# Patient Record
Sex: Male | Born: 1945 | Race: White | Hispanic: No | Smoking: Former smoker
Health system: Southern US, Community
[De-identification: ages and names within clinical notes are randomized; demographics above are authoritative.]

## PROBLEM LIST (undated history)

## (undated) DIAGNOSIS — M1990 Unspecified osteoarthritis, unspecified site: Secondary | ICD-10-CM

## (undated) DIAGNOSIS — J45909 Unspecified asthma, uncomplicated: Secondary | ICD-10-CM

## (undated) DIAGNOSIS — E7800 Pure hypercholesterolemia, unspecified: Secondary | ICD-10-CM

## (undated) DIAGNOSIS — I2510 Atherosclerotic heart disease of native coronary artery without angina pectoris: Secondary | ICD-10-CM

## (undated) DIAGNOSIS — F419 Anxiety disorder, unspecified: Secondary | ICD-10-CM

## (undated) DIAGNOSIS — I639 Cerebral infarction, unspecified: Secondary | ICD-10-CM

## (undated) DIAGNOSIS — F329 Major depressive disorder, single episode, unspecified: Secondary | ICD-10-CM

## (undated) DIAGNOSIS — E119 Type 2 diabetes mellitus without complications: Secondary | ICD-10-CM

## (undated) DIAGNOSIS — J1100 Influenza due to unidentified influenza virus with unspecified type of pneumonia: Secondary | ICD-10-CM

## (undated) DIAGNOSIS — A048 Other specified bacterial intestinal infections: Secondary | ICD-10-CM

## (undated) DIAGNOSIS — J189 Pneumonia, unspecified organism: Secondary | ICD-10-CM

## (undated) DIAGNOSIS — I10 Essential (primary) hypertension: Secondary | ICD-10-CM

## (undated) DIAGNOSIS — J449 Chronic obstructive pulmonary disease, unspecified: Secondary | ICD-10-CM

## (undated) DIAGNOSIS — G3184 Mild cognitive impairment, so stated: Secondary | ICD-10-CM

## (undated) DIAGNOSIS — F0390 Unspecified dementia without behavioral disturbance: Secondary | ICD-10-CM

## (undated) DIAGNOSIS — I714 Abdominal aortic aneurysm, without rupture, unspecified: Secondary | ICD-10-CM

## (undated) DIAGNOSIS — F32A Depression, unspecified: Secondary | ICD-10-CM

## (undated) DIAGNOSIS — K313 Pylorospasm, not elsewhere classified: Secondary | ICD-10-CM

## (undated) DIAGNOSIS — G250 Essential tremor: Secondary | ICD-10-CM

## (undated) DIAGNOSIS — D649 Anemia, unspecified: Secondary | ICD-10-CM

## (undated) DIAGNOSIS — I209 Angina pectoris, unspecified: Secondary | ICD-10-CM

## (undated) DIAGNOSIS — G43909 Migraine, unspecified, not intractable, without status migrainosus: Secondary | ICD-10-CM

## (undated) DIAGNOSIS — I219 Acute myocardial infarction, unspecified: Secondary | ICD-10-CM

## (undated) DIAGNOSIS — R011 Cardiac murmur, unspecified: Secondary | ICD-10-CM

## (undated) DIAGNOSIS — E559 Vitamin D deficiency, unspecified: Secondary | ICD-10-CM

## (undated) HISTORY — DX: Unspecified dementia, unspecified severity, without behavioral disturbance, psychotic disturbance, mood disturbance, and anxiety: F03.90

## (undated) HISTORY — DX: Acute myocardial infarction, unspecified: I21.9

## (undated) HISTORY — DX: Chronic obstructive pulmonary disease, unspecified: J44.9

## (undated) HISTORY — DX: Atherosclerotic heart disease of native coronary artery without angina pectoris: I25.10

## (undated) HISTORY — DX: Pure hypercholesterolemia, unspecified: E78.00

## (undated) HISTORY — DX: Depression, unspecified: F32.A

## (undated) HISTORY — DX: Anemia, unspecified: D64.9

## (undated) HISTORY — DX: Influenza due to unidentified influenza virus with unspecified type of pneumonia: J11.00

## (undated) HISTORY — DX: Unspecified asthma, uncomplicated: J45.909

## (undated) HISTORY — DX: Major depressive disorder, single episode, unspecified: F32.9

## (undated) HISTORY — DX: Other specified bacterial intestinal infections: A04.8

## (undated) HISTORY — DX: Essential tremor: G25.0

## (undated) HISTORY — DX: Anxiety disorder, unspecified: F41.9

## (undated) HISTORY — DX: Vitamin D deficiency, unspecified: E55.9

## (undated) HISTORY — DX: Migraine, unspecified, not intractable, without status migrainosus: G43.909

## (undated) HISTORY — DX: Mild cognitive impairment, so stated: G31.84

---
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
## 1997-04-26 ENCOUNTER — Inpatient Hospital Stay (HOSPITAL_COMMUNITY): Admission: EM | Admit: 1997-04-26 | Discharge: 1997-04-30 | Payer: Self-pay | Admitting: *Deleted

## 1997-05-14 ENCOUNTER — Encounter (HOSPITAL_COMMUNITY): Admission: RE | Admit: 1997-05-14 | Discharge: 1997-08-12 | Payer: Self-pay | Admitting: Cardiovascular Disease

## 1999-06-05 ENCOUNTER — Ambulatory Visit (HOSPITAL_COMMUNITY): Admission: RE | Admit: 1999-06-05 | Discharge: 1999-06-05 | Payer: Self-pay | Admitting: Cardiovascular Disease

## 2000-02-06 ENCOUNTER — Ambulatory Visit (HOSPITAL_BASED_OUTPATIENT_CLINIC_OR_DEPARTMENT_OTHER): Admission: RE | Admit: 2000-02-06 | Discharge: 2000-02-07 | Payer: Self-pay | Admitting: Emergency Medicine

## 2000-07-29 ENCOUNTER — Encounter: Payer: Self-pay | Admitting: Emergency Medicine

## 2000-07-29 ENCOUNTER — Encounter: Admission: RE | Admit: 2000-07-29 | Discharge: 2000-07-29 | Payer: Self-pay | Admitting: Emergency Medicine

## 2001-04-28 ENCOUNTER — Ambulatory Visit (HOSPITAL_COMMUNITY): Admission: RE | Admit: 2001-04-28 | Discharge: 2001-04-28 | Payer: Self-pay | Admitting: *Deleted

## 2002-06-06 ENCOUNTER — Encounter: Payer: Self-pay | Admitting: Emergency Medicine

## 2002-06-06 ENCOUNTER — Encounter: Admission: RE | Admit: 2002-06-06 | Discharge: 2002-06-06 | Payer: Self-pay | Admitting: Emergency Medicine

## 2002-06-17 ENCOUNTER — Encounter: Payer: Self-pay | Admitting: Emergency Medicine

## 2002-06-17 ENCOUNTER — Encounter: Admission: RE | Admit: 2002-06-17 | Discharge: 2002-06-17 | Payer: Self-pay | Admitting: Emergency Medicine

## 2002-07-02 ENCOUNTER — Encounter: Admission: RE | Admit: 2002-07-02 | Discharge: 2002-09-30 | Payer: Self-pay | Admitting: Emergency Medicine

## 2003-12-10 ENCOUNTER — Encounter: Admission: RE | Admit: 2003-12-10 | Discharge: 2003-12-10 | Payer: Self-pay | Admitting: Emergency Medicine

## 2005-11-22 ENCOUNTER — Encounter: Admission: RE | Admit: 2005-11-22 | Discharge: 2005-11-22 | Payer: Self-pay | Admitting: Emergency Medicine

## 2005-12-03 ENCOUNTER — Encounter: Admission: RE | Admit: 2005-12-03 | Discharge: 2005-12-03 | Payer: Self-pay | Admitting: Emergency Medicine

## 2005-12-23 ENCOUNTER — Encounter: Admission: RE | Admit: 2005-12-23 | Discharge: 2005-12-23 | Payer: Self-pay | Admitting: Emergency Medicine

## 2006-01-10 ENCOUNTER — Encounter: Admission: RE | Admit: 2006-01-10 | Discharge: 2006-01-10 | Payer: Self-pay | Admitting: Emergency Medicine

## 2006-04-26 ENCOUNTER — Encounter: Admission: RE | Admit: 2006-04-26 | Discharge: 2006-04-26 | Payer: Self-pay | Admitting: Emergency Medicine

## 2006-05-13 ENCOUNTER — Ambulatory Visit: Payer: Self-pay | Admitting: Vascular Surgery

## 2006-08-05 ENCOUNTER — Ambulatory Visit: Payer: Self-pay | Admitting: Vascular Surgery

## 2007-02-08 ENCOUNTER — Ambulatory Visit: Payer: Self-pay | Admitting: Vascular Surgery

## 2007-10-18 ENCOUNTER — Ambulatory Visit: Payer: Self-pay | Admitting: Vascular Surgery

## 2008-10-23 ENCOUNTER — Ambulatory Visit: Payer: Self-pay | Admitting: Vascular Surgery

## 2009-04-04 ENCOUNTER — Emergency Department (HOSPITAL_COMMUNITY): Admission: EM | Admit: 2009-04-04 | Discharge: 2009-04-04 | Payer: Self-pay | Admitting: Emergency Medicine

## 2009-12-04 ENCOUNTER — Ambulatory Visit: Payer: Self-pay | Admitting: Vascular Surgery

## 2010-02-14 ENCOUNTER — Encounter: Payer: Self-pay | Admitting: Emergency Medicine

## 2010-02-21 ENCOUNTER — Emergency Department (HOSPITAL_COMMUNITY)
Admission: EM | Admit: 2010-02-21 | Discharge: 2010-02-22 | Payer: Self-pay | Source: Home / Self Care | Admitting: Emergency Medicine

## 2010-04-20 LAB — POCT I-STAT, CHEM 8
Hemoglobin: 13.9 g/dL (ref 13.0–17.0)
Potassium: 3.9 mEq/L (ref 3.5–5.1)
Sodium: 141 mEq/L (ref 135–145)
TCO2: 30 mmol/L (ref 0–100)

## 2010-04-20 LAB — DIFFERENTIAL
Basophils Absolute: 0 10*3/uL (ref 0.0–0.1)
Eosinophils Absolute: 0.2 10*3/uL (ref 0.0–0.7)
Eosinophils Relative: 3 % (ref 0–5)
Lymphocytes Relative: 21 % (ref 12–46)
Neutrophils Relative %: 68 % (ref 43–77)

## 2010-04-20 LAB — CBC
HCT: 39.7 % (ref 39.0–52.0)
MCV: 100.3 fL — ABNORMAL HIGH (ref 78.0–100.0)
Platelets: 213 10*3/uL (ref 150–400)
RDW: 14.2 % (ref 11.5–15.5)

## 2010-06-09 NOTE — Assessment & Plan Note (Signed)
OFFICE VISIT   [name], [name]  DOB:  1945/05/05                                       10/18/2007  CHART#:[id number]   The patient returns for followup today for a small abdominal aortic  aneurysm.  He was last seen in July of 2008.  He denies any abdominal or  back pain.  His atherosclerotic risk factors continue to include  hypertension, coronary artery disease and elevated cholesterol.   He has had no significant medical problems since his last office visit.   PHYSICAL EXAMINATION:  On physical exam today blood pressure is 126/82  in the left arm, pulse is 50 and regular.  HEENT is unremarkable.  Neck  has 2+ carotid pulses without bruit.  Chest is clear to auscultation.  Cardiac exam is regular rate and rhythm without murmur.  Abdomen is  soft, nontender, nondistended with a vaguely palpable pulsatile mass in  the epigastrium.  He has 2+ femoral pulses bilaterally.   Ultrasound of the aorta was performed again today which shows the  aneurysm diameter is 3.8 cm in diameter.  I reassured the patient that  this is still quite small and I would not consider repair until it is at  least 5.5 cm larger or symptomatic.  He will follow up in 1 year's time  with repeat aortic ultrasound.   Janetta Hora. Fields, MD  Electronically Signed   CEF/MEDQ  D:  10/18/2007  T:  10/19/2007  Job:  [id number]   cc:   Reuben Likes, M.D.

## 2010-06-09 NOTE — Procedures (Signed)
DUPLEX ULTRASOUND OF ABDOMINAL AORTA   INDICATION:  Followup of known abdominal aortic aneurysm.  The patient was recently diagnosed with Alzheimer's disease.   HISTORY:  Diabetes:  No.  Cardiac:  Ischemic heart disease, murmur.  Hypertension:  Yes.  Smoking:  Pipe.  Connective Tissue Disorder:  Family History:  No.  Previous Surgery:  No.   DUPLEX EXAM:         AP (cm)                   TRANSVERSE (cm)  Proximal             3.04 cm                   3.05 cm  Mid                  3.53 cm                   3.71 cm  Distal               2.79 cm                   2.80 cm  Right Iliac          1.25 cm                   1.38 cm  Left Iliac           1.25 cm                   1.35 cm   PREVIOUS:  Date: 08/05/2006  AP:  3.44  TRANSVERSE:  3.42   IMPRESSION:  1. Fairly stable abdominal aortic aneurysm with maximum diameter      measuring 3.53 cm (AP) x 3.71 cm.  2. Ectatic common iliac arteries bilaterally.   ___________________________________________  Janetta Hora Fields, MD   PB/MEDQ  D:  02/08/2007  T:  02/08/2007  Job:  [id number]

## 2010-06-09 NOTE — Procedures (Signed)
DUPLEX ULTRASOUND OF ABDOMINAL AORTA   INDICATION:  One year followup of an aortic aneurysm.   HISTORY:  Diabetes:  No  Cardiac:  Yes, of an ischemic heart  Hypertension:  Yes  Smoking:  Yes  Connective Tissue Disorder:  Family History:  No  Previous Surgery:   DUPLEX EXAM:         AP (cm)                   TRANSVERSE (cm)  Proximal             2.9 cm                    2.8 cm  Mid                  3.1 cm                    3.5 cm  Distal               3.2 cm                    3.7 cm  Right Iliac          1.2 cm                    1.2 cm  Left Iliac           1.1 cm                    1.6 cm   PREVIOUS:  Date:  10/18/2007  AP:  3.7  TRANSVERSE:  3.5   IMPRESSION:  Aortic aneurysm with no significant change in the maximum  diameter compared to the previous exam performed on October 18, 2007.   ___________________________________________  Janetta Hora. Fields, MD   CJ/MEDQ  D:  10/23/2008  T:  10/23/2008  Job:  [id number]

## 2010-06-09 NOTE — Procedures (Signed)
DUPLEX ULTRASOUND OF ABDOMINAL AORTA   INDICATION:  Followup known abdominal aortic aneurysm   HISTORY:  Diabetes:  No  Cardiac:  Ischemic heart disease, murmur  Hypertension:  No  Smoking:  Pipe  Connective Tissue Disorder:  Family History:  No  Previous Surgery:  No   DUPLEX EXAM:         AP (cm)                   TRANSVERSE (cm)  Proximal             2.15 Cm                   2.05 cm  Mid                  3.44 cm                   3.42 cm  Distal               2.81 cm                   2.86 cm  Right Iliac          1.00 cm                   1.12 cm  Left Iliac           1.07 cm                   1.16 cm   PREVIOUS:  Date: April 26, 2006 [facility])  AP:  4.6  TRANSVERSE:  4.5   Previous MRI with a date of October 26, 2005 = 3.2 cm   IMPRESSION:  The abdominal aorta was imaged, Dopplered, and shows a  measurement of 3.44 cm x 3.42 cm at its largest point.  This measurement  is significantly smaller than the previous measurement  obtained at [facility].  The patient did state that at that  location the tech told him they were having a difficult time imaging due  to bowel gas.   ___________________________________________  Janetta Hora. Fields, MD   AS/MEDQ  D:  08/05/2006  T:  08/06/2006  Job:  [id number]

## 2010-06-09 NOTE — Procedures (Signed)
DUPLEX ULTRASOUND OF ABDOMINAL AORTA   INDICATION:  Follow up abdominal aortic aneurysm.   HISTORY:  Diabetes:  No.  Cardiac:  Heart disease.  Hypertension:  Yes.  Smoking:  Yes.  Connective Tissue Disorder:  Family History:  No.  Previous Surgery:  No.   DUPLEX EXAM:         AP (cm)                   TRANSVERSE (cm)  Proximal             2.9 Cm                    2.7 cm  Mid                  3.8 cm                    3.5 cm  Distal               3.7 cm                    3.5 cm  Right Iliac          1.4 cm                    1.3 cm  Left Iliac           1.3 cm                    1.4 cm   PREVIOUS:  Date: 02/08/07  AP:  3.5  TRANSVERSE:  3.7   IMPRESSION:  Aneurysm of the mid-to-distal abdominal aorta with no  significant change in the maximum diameter measurements when compared to  the previous examination on February 08, 2007.   ___________________________________________  Janetta Hora. Fields, MD   CH/MEDQ  D:  10/18/2007  T:  10/18/2007  Job:  [id number]

## 2010-06-09 NOTE — Procedures (Signed)
DUPLEX ULTRASOUND OF ABDOMINAL AORTA   INDICATION:  Follow up AAA.   HISTORY:  Diabetes:  No.  Cardiac:  No.  Hypertension:  Yes.  Smoking:  Yes.  Connective Tissue Disorder:  Family History:  No.  Previous Surgery:  No.   DUPLEX EXAM:         AP (cm)                   TRANSVERSE (cm)  Proximal             2.62 cm                   2.62 cm  Mid                  3.23 cm                   3.37 cm  Distal               3.52 cm                   3.62 cm  Right Iliac          1.11 cm                   0.97 cm  Left Iliac           1.24 cm                   1.49 cm   PREVIOUS:  Date: 10/23/2008  AP:  3.2  TRANSVERSE:  3.7   IMPRESSION:  Abdominal aortic aneurysm noted with largest measurement of  3.52 X 3.62 cm.   ___________________________________________  Janetta Hora. Darrick Penna, MD   EM/MEDQ  D:  12/04/2009  T:  12/04/2009  Job:  [id number]

## 2010-06-09 NOTE — Assessment & Plan Note (Signed)
OFFICE VISIT   [name], [name]  DOB:  [birth year]                                       08/05/2006  CHART#:[id number]   The patient returns today for followup of his small abdominal aortic  aneurysm.  He has no complaints of back or abdominal pain recently.  Atherosclerotic risk factors continue to include hypertension, coronary  artery disease, and elevated cholesterol.  He also continues to smoke a  pipe intermittently.   PHYSICAL EXAMINATION:  Vital signs:  Blood pressure is 118/70, heart  rate is 56 and regular.  Abdomen:  Soft, nontender with no palpable  pulsatile mass.  He is slightly obese.  He has 2+ femoral, popliteal,  dorsalis pedis, and posterior tibial pulses bilaterally.  Chest:  Clear  to auscultation.  Cardiac:  Regular rate and rhythm.  There are no  carotid bruits.   He had a repeat ultrasound of his abdominal aorta today.  This shows a  3.4-cm abdominal aortic aneurysm.  This was a much smaller diameter than  previously measured at [facility] but apparently there was some  bowel gas artifact at his last scan.   Overall, the patient's aneurysm is still quite small.  I believe we  should continue surveillance.   He will follow up in 6 months' time for a repeat aneurysm ultrasound.   Janetta Hora. Fields, MD  Electronically Signed   CEF/MEDQ  D:  08/05/2006  T:  08/08/2006  Job:  [id number]   cc:   Reuben Likes, M.D.  Thereasa Solo. Little, M.D.

## 2010-12-21 ENCOUNTER — Ambulatory Visit (INDEPENDENT_AMBULATORY_CARE_PROVIDER_SITE_OTHER): Payer: Medicare Other | Admitting: *Deleted

## 2010-12-21 DIAGNOSIS — I714 Abdominal aortic aneurysm, without rupture: Secondary | ICD-10-CM

## 2010-12-28 ENCOUNTER — Encounter: Payer: Self-pay | Admitting: Vascular Surgery

## 2010-12-28 NOTE — Procedures (Unsigned)
DUPLEX ULTRASOUND OF ABDOMINAL AORTA  INDICATION:  AAA  HISTORY: Diabetes:  No Cardiac:  No Hypertension:  Yes Smoking:  Yes Connective Tissue Disorder: Family History:  No Previous Surgery:  No  DUPLEX EXAM:         AP (cm)                   TRANSVERSE (cm) Proximal             Not visualized            Not visualized Mid                  3.42 cm                   3.42 cm Distal               3.56 cm                   3.64 cm Right Iliac          1.26 cm                   1.22 cm Left Iliac           1.29 cm                   1.33 cm  PREVIOUS:  Date:  12/04/2009  AP:  3.52  TRANSVERSE:  3.62  IMPRESSION: 1. Abdominal aortic aneurysm noted today with largest measurement of     3.56 x 3.64 cm. 2. The proximal aorta was not well visualized due to overlying bowel     gas.  ___________________________________________ Janetta Hora. Fields, MD  EM/MEDQ  D:  12/22/2010  T:  12/22/2010  Job:  [id number]

## 2010-12-30 ENCOUNTER — Other Ambulatory Visit: Payer: Self-pay

## 2010-12-30 DIAGNOSIS — I714 Abdominal aortic aneurysm, without rupture: Secondary | ICD-10-CM

## 2011-07-11 ENCOUNTER — Emergency Department (HOSPITAL_COMMUNITY): Payer: Medicare Other

## 2011-07-11 ENCOUNTER — Encounter (HOSPITAL_COMMUNITY): Payer: Self-pay

## 2011-07-11 ENCOUNTER — Emergency Department (HOSPITAL_COMMUNITY)
Admission: EM | Admit: 2011-07-11 | Discharge: 2011-07-11 | Disposition: A | Discharge status: Home / Self Care | Payer: Medicare Other | Attending: Emergency Medicine | Admitting: Emergency Medicine

## 2011-07-11 DIAGNOSIS — T1490XA Injury, unspecified, initial encounter: Secondary | ICD-10-CM | POA: Insufficient documentation

## 2011-07-11 DIAGNOSIS — R42 Dizziness and giddiness: Secondary | ICD-10-CM | POA: Insufficient documentation

## 2011-07-11 DIAGNOSIS — I714 Abdominal aortic aneurysm, without rupture, unspecified: Secondary | ICD-10-CM | POA: Insufficient documentation

## 2011-07-11 DIAGNOSIS — I2510 Atherosclerotic heart disease of native coronary artery without angina pectoris: Secondary | ICD-10-CM | POA: Insufficient documentation

## 2011-07-11 DIAGNOSIS — I10 Essential (primary) hypertension: Secondary | ICD-10-CM | POA: Insufficient documentation

## 2011-07-11 DIAGNOSIS — R404 Transient alteration of awareness: Secondary | ICD-10-CM | POA: Insufficient documentation

## 2011-07-11 HISTORY — DX: Atherosclerotic heart disease of native coronary artery without angina pectoris: I25.10

## 2011-07-11 HISTORY — DX: Essential (primary) hypertension: I10

## 2011-07-11 HISTORY — DX: Unspecified osteoarthritis, unspecified site: M19.90

## 2011-07-11 HISTORY — DX: Abdominal aortic aneurysm, without rupture, unspecified: I71.40

## 2011-07-11 HISTORY — DX: Abdominal aortic aneurysm, without rupture: I71.4

## 2011-07-11 LAB — COMPREHENSIVE METABOLIC PANEL
ALT: 12 U/L (ref 0–53)
AST: 20 U/L (ref 0–37)
Alkaline Phosphatase: 73 U/L (ref 39–117)
CO2: 24 mEq/L (ref 19–32)
Chloride: 106 mEq/L (ref 96–112)
GFR calc non Af Amer: 71 mL/min — ABNORMAL LOW (ref >90)
Potassium: 3.9 mEq/L (ref 3.5–5.1)
Sodium: 141 mEq/L (ref 135–145)
Total Bilirubin: 0.3 mg/dL (ref 0.3–1.2)

## 2011-07-11 LAB — TROPONIN I: Troponin I: <0.3 ng/mL (ref <0.30)

## 2011-07-11 LAB — CBC
Platelets: 252 10*3/uL (ref 150–400)
RBC: 3.9 MIL/uL — ABNORMAL LOW (ref 4.22–5.81)
WBC: 8.3 10*3/uL (ref 4.0–10.5)

## 2011-07-11 MED ORDER — IOHEXOL 350 MG/ML SOLN
100.0000 mL | Freq: Once | INTRAVENOUS | Status: AC | PRN
  Administered 2011-07-11: 100 mL via INTRAVENOUS
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
## 2011-07-11 NOTE — ED Notes (Signed)
Pt alert, NAD, calm, interactive, skin W&D, resps e/u, speaking in clear complete sentences, family at [facility], pt dressing self, EDP into see pt at d/c, out in w/c, denies pain, "just stiff".
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
## 2011-12-29 ENCOUNTER — Encounter: Payer: Self-pay | Admitting: Neurosurgery

## 2011-12-30 ENCOUNTER — Ambulatory Visit (INDEPENDENT_AMBULATORY_CARE_PROVIDER_SITE_OTHER): Payer: Medicare Other | Admitting: Neurosurgery

## 2011-12-30 ENCOUNTER — Encounter: Payer: Self-pay | Admitting: Neurosurgery

## 2011-12-30 ENCOUNTER — Encounter (INDEPENDENT_AMBULATORY_CARE_PROVIDER_SITE_OTHER): Payer: Medicare Other | Admitting: *Deleted

## 2011-12-30 VITALS — BP 146/81 | HR 43 | Resp 16 | Ht 72.0 in | Wt 186.3 lb

## 2011-12-30 DIAGNOSIS — I714 Abdominal aortic aneurysm, without rupture, unspecified: Secondary | ICD-10-CM

## 2011-12-30 DIAGNOSIS — I723 Aneurysm of iliac artery: Secondary | ICD-10-CM

## 2011-12-30 NOTE — Progress Notes (Signed)
[facility] [place] AAA/PAD/PVD Office Note  CC: AAA surveillance Referring Physician: Fields  History of Present Illness: 66 year old male patient of Dr. Darrick Penna in followed for known AAA. 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 The patient denies any unusual abdominal or back pain. The patient denies any new medical diagnoses or recent surgery.  Past Medical History  Diagnosis Date  . Hypertension   . Arthritis   . Coronary artery disease   . AAA (abdominal aortic aneurysm)   . Myocardial infarction     ROS: [x]  Positive   [ ]  Denies    General: [ ]  Weight loss, [ ]  Fever, [ ]  chills Neurologic: [ ]  Dizziness, [ ]  Blackouts, [ ]  Seizure [ ]  Stroke, [ ]  "Mini stroke", [ ]  Slurred speech, [ ]  Temporary blindness; [ ]  weakness in arms or legs, [ ]  Hoarseness Cardiac: [ ]  Chest pain/pressure, [ ]  Shortness of breath at rest [ ]  Shortness of breath with exertion, [ ]  Atrial fibrillation or irregular heartbeat Vascular: [ ]  Pain in legs with walking, [ ]  Pain in legs at rest, [ ]  Pain in legs at night,  [ ]  Non-healing ulcer, [ ]  Blood clot in vein/DVT,   Pulmonary: [ ]  Home oxygen, [ ]  Productive cough, [ ]  Coughing up blood, [ ]  Asthma,  [ ]  Wheezing Musculoskeletal:  [ ]  Arthritis, [ ]  Low back pain, [ ]  Joint pain Hematologic: [ ]  Easy Bruising, [ ]  Anemia; [ ]  Hepatitis Gastrointestinal: [ ]  Blood in stool, [ ]  Gastroesophageal Reflux/heartburn, [ ]  Trouble swallowing Urinary: [ ]  chronic Kidney disease, [ ]  on HD - [ ]  MWF or [ ]  TTHS, [ ]  Burning with urination, [ ]  Difficulty urinating Skin: [ ]  Rashes, [ ]  Wounds Psychological: [ ]  Anxiety, [ ]  Depression   Social History History  Substance Use Topics  . Smoking status: Current Every Day Smoker  . Smokeless tobacco: Never Used  . Alcohol Use: Yes     Comment: Seldom    Family History Family History  Problem Relation Age of Onset  . Family history unknown: Yes     Allergies  Allergen Reactions  . Amoxicillin Swelling  . Penicillins Swelling    Current Outpatient Prescriptions  Medication Sig Dispense Refill  . albuterol (PROVENTIL HFA;VENTOLIN HFA) 108 (90 BASE) MCG/ACT inhaler Inhale 2 puffs into the lungs every 6 (six) hours as needed. For shortness of breath      . DULERA 200-5 MCG/ACT AERO Twice daily.      . fexofenadine (ALLEGRA) 180 MG tablet Take 180 mg by mouth daily.      . fish oil-omega-3 fatty acids 1000 MG capsule Take 1 g by mouth 2 (two) times daily.      . furosemide (LASIX) 40 MG tablet Take 40 mg by mouth daily.      Marland Kitchen galantamine (RAZADYNE ER) 8 MG 24 hr capsule Take 8 mg by mouth daily with breakfast.      . ibuprofen (ADVIL,MOTRIN) 200 MG tablet Take 600 mg by mouth every 6 (six) hours as needed. For pain      . isosorbide mononitrate (IMDUR) 30 MG 24 hr tablet Take 30 mg by mouth daily.      . memantine (NAMENDA) 10 MG tablet Take 10 mg by mouth 2 (two) times daily.      . metoprolol succinate (TOPROL-XL) 25 MG 24 hr tablet Take 12.5 mg by mouth  daily.      Marland Kitchen NASONEX 50 MCG/ACT nasal spray daily.      . niacin (NIASPAN) 500 MG CR tablet Take 500 mg by mouth at bedtime.      . nitroGLYCERIN (NITROLINGUAL) 0.4 MG/SPRAY spray Place 1 spray under the tongue every 5 (five) minutes as needed. For chest pain      . pravastatin (PRAVACHOL) 80 MG tablet Take 80 mg by mouth at bedtime.      . ramipril (ALTACE) 10 MG capsule Take 10 mg by mouth daily.      Marland Kitchen venlafaxine XR (EFFEXOR-XR) 75 MG 24 hr capsule       . vitamin C (ASCORBIC ACID) 500 MG tablet Take 500 mg by mouth 2 (two) times daily.      . vitamin E 400 UNIT capsule Take 200 Units by mouth 2 (two) times daily.      . clorazepate (TRANXENE) 7.5 MG tablet Take 7.5 mg by mouth 2 (two) times daily.        Physical Examination  Filed Vitals:   12/30/11 1009  BP: 146/81  Pulse: 43  Resp: 16    Body mass index is 25.27 kg/(m^2).  General:  WDWN in NAD Gait:  Normal HEENT: WNL Eyes: Pupils equal Pulmonary: normal non-labored breathing , without Rales, rhonchi,  wheezing Cardiac: RRR, without  Murmurs, rubs or gallops; No carotid bruits Abdomen: soft, NT, no masses Skin: no rashes, ulcers noted Vascular Exam/Pulses: 3+ radial pulses bilaterally, there is a mild pulsatile abdominal mass noted  Extremities without ischemic changes, no Gangrene , no cellulitis; no open wounds;  Musculoskeletal: no muscle wasting or atrophy  Neurologic: A&O X 3; Appropriate Affect ; SENSATION: normal; MOTOR FUNCTION:  moving all extremities equally. Speech is fluent/normal  Non-Invasive Vascular Imaging: AAA duplex today shows a maximum diameter of 3.56 x 3.59 proximally, 4.29 by 4.27 distally, there has been no change in the proximal to the distal bulb has increased from 3.64 in November 2012  ASSESSMENT/PLAN: Asymptomatic patient with a mild increase in the distal AAA in his tandem, the patient will followup in one year with repeat AAA duplex, his questions were encouraged and answered, he is in agreement with this plan.  Lauree Chandler ANP   Clinic M.D.: Fields

## 2011-12-30 NOTE — Addendum Note (Signed)
Addended by: Sharee Pimple on: 12/30/2011 11:33 AM   Modules accepted: Orders

## 2012-07-24 ENCOUNTER — Other Ambulatory Visit: Payer: Self-pay | Admitting: Nurse Practitioner

## 2012-08-18 ENCOUNTER — Ambulatory Visit (INDEPENDENT_AMBULATORY_CARE_PROVIDER_SITE_OTHER): Payer: Medicare Other | Admitting: Neurology

## 2012-08-18 ENCOUNTER — Encounter: Payer: Self-pay | Admitting: Neurology

## 2012-08-18 VITALS — BP 129/78 | HR 52 | Resp 18 | Ht 71.0 in | Wt 195.0 lb

## 2012-08-18 DIAGNOSIS — G252 Other specified forms of tremor: Secondary | ICD-10-CM

## 2012-08-18 DIAGNOSIS — R413 Other amnesia: Secondary | ICD-10-CM

## 2012-08-18 DIAGNOSIS — G3184 Mild cognitive impairment, so stated: Secondary | ICD-10-CM

## 2012-08-18 DIAGNOSIS — G250 Essential tremor: Secondary | ICD-10-CM

## 2012-08-18 HISTORY — DX: Essential tremor: G25.0

## 2012-08-18 HISTORY — DX: Mild cognitive impairment of uncertain or unknown etiology: G31.84
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
## 2012-08-18 NOTE — Patient Instructions (Signed)
Exercise to Lose Weight Exercise and a healthy diet may help you lose weight. Your doctor may suggest specific exercises. EXERCISE IDEAS AND TIPS  Choose low-cost things you enjoy doing, such as walking, bicycling, or exercising to workout videos.  Take stairs instead of the elevator.  Walk during your lunch break.  Park your car further away from work or school.  Go to a gym or an exercise class.  Start with 5 to 10 minutes of exercise each day. Build up to 30 minutes of exercise 4 to 6 days a week.  Wear shoes with good support and comfortable clothes.  Stretch before and after working out.  Work out until you breathe harder and your heart beats faster.  Drink extra water when you exercise.  Do not do so much that you hurt yourself, feel dizzy, or get very short of breath. Exercises that burn about 150 calories:  Running 1  miles in 15 minutes.  Playing volleyball for 45 to 60 minutes.  Washing and waxing a car for 45 to 60 minutes.  Playing touch football for 45 minutes.  Walking 1  miles in 35 minutes.  Pushing a stroller 1  miles in 30 minutes.  Playing basketball for 30 minutes.  Raking leaves for 30 minutes.  Bicycling 5 miles in 30 minutes.  Walking 2 miles in 30 minutes.  Dancing for 30 minutes.  Shoveling snow for 15 minutes.  Swimming laps for 20 minutes.  Walking up stairs for 15 minutes.  Bicycling 4 miles in 15 minutes.  Gardening for 30 to 45 minutes.  Jumping rope for 15 minutes.  Washing windows or floors for 45 to 60 minutes. Document Released: 02/13/2010 Document Revised: 04/05/2011 Document Reviewed: 02/13/2010 [facility] Patient Information 2014 [place], [place]. Smoking Cessation, Tips for Success YOU CAN QUIT SMOKING If you are ready to quit smoking, congratulations! You have chosen to help yourself be healthier. Cigarettes bring nicotine, tar, carbon monoxide, and other irritants into your body. Your lungs, heart, and blood  vessels will be able to work better without these poisons. There are many different ways to quit smoking. Nicotine gum, nicotine patches, a nicotine inhaler, or nicotine nasal spray can help with physical craving. Hypnosis, support groups, and medicines help break the habit of smoking. Here are some tips to help you quit for good.  Throw away all cigarettes.  Clean and remove all ashtrays from your home, work, and car.  On a card, write down your reasons for quitting. Carry the card with you and read it when you get the urge to smoke.  Cleanse your body of nicotine. Drink enough water and fluids to keep your urine clear or pale yellow. Do this after quitting to flush the nicotine from your body.  Learn to predict your moods. Do not let a bad situation be your excuse to have a cigarette. Some situations in your life might tempt you into wanting a cigarette.  Never have "just one" cigarette. It leads to wanting another and another. Remind yourself of your decision to quit.  Change habits associated with smoking. If you smoked while driving or when feeling stressed, try other activities to replace smoking. Stand up when drinking your coffee. Brush your teeth after eating. Sit in a different chair when you read the paper. Avoid alcohol while trying to quit, and try to drink fewer caffeinated beverages. Alcohol and caffeine may urge you to smoke.  Avoid foods and drinks that can trigger a desire to smoke, such as  sugary or spicy foods and alcohol.  Ask people who smoke not to smoke around you.  Have something planned to do right after eating or having a cup of coffee. Take a walk or exercise to perk you up. This will help to keep you from overeating.  Try a relaxation exercise to calm you down and decrease your stress. Remember, you may be tense and nervous for the first 2 weeks after you quit, but this will pass.  Find new activities to keep your hands busy. Play with a pen, coin, or rubber band.  Doodle or draw things on paper.  Brush your teeth right after eating. This will help cut down on the craving for the taste of tobacco after meals. You can try mouthwash, too.  Use oral substitutes, such as lemon drops, carrots, a cinnamon stick, or chewing gum, in place of cigarettes. Keep them handy so they are available when you have the urge to smoke.  When you have the urge to smoke, try deep breathing.  Designate your home as a nonsmoking area.  If you are a heavy smoker, ask your caregiver about a prescription for nicotine chewing gum. It can ease your withdrawal from nicotine.  Reward yourself. Set aside the cigarette money you save and buy yourself something nice.  Look for support from others. Join a support group or smoking cessation program. Ask someone at home or at work to help you with your plan to quit smoking.  Always ask yourself, "Do I need this cigarette or is this just a reflex?" Tell yourself, "Today, I choose not to smoke," or "I do not want to smoke." You are reminding yourself of your decision to quit, even if you do smoke a cigarette. HOW WILL I FEEL WHEN I QUIT SMOKING?  The benefits of not smoking start within days of quitting.  You may have symptoms of withdrawal because your body is used to nicotine (the addictive substance in cigarettes). You may crave cigarettes, be irritable, feel very hungry, cough often, get headaches, or have difficulty concentrating.  The withdrawal symptoms are only temporary. They are strongest when you first quit but will go away within 10 to 14 days.  When withdrawal symptoms occur, stay in control. Think about your reasons for quitting. Remind yourself that these are signs that your body is healing and getting used to being without cigarettes.  Remember that withdrawal symptoms are easier to treat than the major diseases that smoking can cause.  Even after the withdrawal is over, expect periodic urges to smoke. However, these  cravings are generally short-lived and will go away whether you smoke or not. Do not smoke!  If you relapse and smoke again, do not lose hope. Most smokers quit 3 times before they are successful.  If you relapse, do not give up! Plan ahead and think about what you will do the next time you get the urge to smoke. LIFE AS A NONSMOKER: MAKE IT FOR A MONTH, MAKE IT FOR LIFE Day 1: Hang this page where you will see it every day. Day 2: Get rid of all ashtrays, matches, and lighters. Day 3: Drink water. Breathe deeply between sips. Day 4: Avoid places with smoke-filled air, such as bars, clubs, or the smoking section of restaurants. Day 5: Keep track of how much money you save by not smoking. Day 6: Avoid boredom. Keep a good book with you or go to the movies. Day 7: Reward yourself! One week without smoking! Day 8:  Make a dental appointment to get your teeth cleaned. Day 9: Decide how you will turn down a cigarette before it is offered to you. Day 10: Review your reasons for quitting. Day 11: Distract yourself. Stay active to keep your mind off smoking and to relieve tension. Take a walk, exercise, read a book, do a crossword puzzle, or try a new hobby. Day 12: Exercise. Get off the bus before your stop or use stairs instead of escalators. Day 13: Call on friends for support and encouragement. Day 14: Reward yourself! Two weeks without smoking! Day 15: Practice deep breathing exercises. Day 16: Bet a friend that you can stay a nonsmoker. Day 17: Ask to sit in nonsmoking sections of restaurants. Day 18: Hang up "No Smoking" signs. Day 19: Think of yourself as a nonsmoker. Day 20: Each morning, tell yourself you will not smoke. Day 21: Reward yourself! Three weeks without smoking! Day 22: Think of smoking in negative ways. Remember how it stains your teeth, gives you bad breath, and leaves you short of breath. Day 23: Eat a nutritious breakfast. Day 24:Do not relive your days as a smoker. Day  25: Hold a pencil in your hand when talking on the telephone. Day 26: Tell all your friends you do not smoke. Day 27: Think about how much better food tastes. Day 28: Remember, one cigarette is one too many. Day 29: Take up a hobby that will keep your hands busy. Day 30: Congratulations! One month without smoking! Give yourself a big reward. Your caregiver can direct you to community resources or hospitals for support, which may include:  Group support.  Education.  Hypnosis.  Subliminal therapy. Document Released: 10/10/2003 Document Revised: 04/05/2011 Document Reviewed: 10/28/2008 [facility] Patient Information 2014 [place], [place].

## 2012-08-31 ENCOUNTER — Other Ambulatory Visit (INDEPENDENT_AMBULATORY_CARE_PROVIDER_SITE_OTHER): Payer: Medicare Other | Admitting: Radiology

## 2012-08-31 DIAGNOSIS — G3184 Mild cognitive impairment, so stated: Secondary | ICD-10-CM

## 2012-08-31 DIAGNOSIS — R413 Other amnesia: Secondary | ICD-10-CM

## 2012-09-01 ENCOUNTER — Ambulatory Visit
Admission: RE | Admit: 2012-09-01 | Discharge: 2012-09-01 | Disposition: A | Discharge status: Home / Self Care | Payer: Medicare Other | Source: Ambulatory Visit | Attending: Neurology | Admitting: Neurology

## 2012-09-01 DIAGNOSIS — G3184 Mild cognitive impairment, so stated: Secondary | ICD-10-CM

## 2012-09-01 DIAGNOSIS — R413 Other amnesia: Secondary | ICD-10-CM

## 2012-09-01 NOTE — Progress Notes (Signed)
[facility]  EEG (ELECTROENCEPHALOGRAM) REPORT   STUDY DATE:   08-31-12 PATIENT NAME: [name]  DOB: 1945-09-23 MRN:  [id number] ; Dr Phylis Bougie CLINICIAN: Melvyn Novas, MD   TECHNOLOGIST: Kaylyn Lim  TECHNIQUE: Electroencephalogram was recorded utilizing standard 10-20 system of lead placement and reformatted into average and bipolar montages.  RECORDING TIME: 30.5 minutes  ACTIVATION:  HV and strobe lights.   CLINICAL INFORMATION:   [name] has been diagnosed more than 12 years ago with beginning dementia and had a remarkably slow progression. He is a former patient of Dr. Fransisca Connors.  This patient is a 30 minute EEG recording was performed with photic stimulation hyperventilation was deferred the patient reports amnestic spells while at baseline the day to day activities being able to live alone and unsupervised. He is a motor history of a stroke in 2001 or 2002. The patient reports increasing tremors in both hands.  A posterior dominant background rhythm was seen at 9 Hz visit which is a normal frequency and amplitude. The EKG electrode showed a normal sinus rhythm with 58 beats per minute. The patient was awake and drowsy during this recording period he did not show any photic entrainment.  There were no focal or generalized epileptiform discharges.  Impression:  this is a normal EEG  Charie Pinkus, MD      FINDINGS:    Melvyn Novas , MD

## 2012-10-24 ENCOUNTER — Other Ambulatory Visit: Payer: Self-pay | Admitting: Neurology

## 2012-12-27 ENCOUNTER — Encounter: Payer: Self-pay | Admitting: Family

## 2012-12-27 DIAGNOSIS — G3184 Mild cognitive impairment, so stated: Secondary | ICD-10-CM

## 2012-12-27 DIAGNOSIS — F341 Dysthymic disorder: Secondary | ICD-10-CM

## 2012-12-28 ENCOUNTER — Ambulatory Visit (INDEPENDENT_AMBULATORY_CARE_PROVIDER_SITE_OTHER): Payer: Medicare Other | Admitting: Family

## 2012-12-28 ENCOUNTER — Encounter: Payer: Self-pay | Admitting: Family

## 2012-12-28 ENCOUNTER — Ambulatory Visit (HOSPITAL_COMMUNITY)
Admission: RE | Admit: 2012-12-28 | Discharge: 2012-12-28 | Disposition: A | Discharge status: Home / Self Care | Payer: Medicare Other | Source: Ambulatory Visit | Attending: Family | Admitting: Family

## 2012-12-28 ENCOUNTER — Encounter (INDEPENDENT_AMBULATORY_CARE_PROVIDER_SITE_OTHER): Payer: Self-pay

## 2012-12-28 ENCOUNTER — Encounter (HOSPITAL_COMMUNITY): Payer: Medicare Other

## 2012-12-28 VITALS — BP 150/71 | HR 49 | Resp 14 | Ht 71.5 in | Wt 195.0 lb

## 2012-12-28 DIAGNOSIS — I714 Abdominal aortic aneurysm, without rupture, unspecified: Secondary | ICD-10-CM | POA: Insufficient documentation

## 2012-12-28 DIAGNOSIS — I723 Aneurysm of iliac artery: Secondary | ICD-10-CM

## 2012-12-28 NOTE — Patient Instructions (Addendum)
Abdominal Aortic Aneurysm An aneurysm is a weakened or damaged part of an artery wall that bulges from the normal force of blood pumping through the body. An abdominal aortic aneurysm is an aneurysm that occurs in the lower part of the aorta, the main artery of the body.  The major concern with an abdominal aortic aneurysm is that it can enlarge and burst (rupture) or blood can flow between the layers of the wall of the aorta through a tear (aorticdissection). Both of these conditions can cause bleeding inside the body and can be life threatening unless diagnosed and treated promptly. CAUSES  The exact cause of an abdominal aortic aneurysm is unknown. Some contributing factors are:   A hardening of the arteries caused by the buildup of fat and other substances in the lining of a blood vessel (arteriosclerosis).  Inflammation of the walls of an artery (arteritis).   Connective tissue diseases, such as Marfan syndrome.   Abdominal trauma.   An infection, such as syphilis or staphylococcus, in the wall of the aorta (infectious aortitis) caused by bacteria. RISK FACTORS  Risk factors that contribute to an abdominal aortic aneurysm may include:  Age older than 60 years.   High blood pressure (hypertension).  Male gender.  Ethnicity (white race).  Obesity.  Family history of aneurysm (first degree relatives only).  Tobacco use. PREVENTION  The following healthy lifestyle habits may help decrease your risk of abdominal aortic aneurysm:  Quitting smoking. Smoking can raise your blood pressure and cause arteriosclerosis.  Limiting or avoiding alcohol.  Keeping your blood pressure, blood sugar level, and cholesterol levels within normal limits.  Decreasing your salt intake. In somepeople, too much salt can raise blood pressure and increase your risk of abdominal aortic aneurysm.  Eating a diet low in saturated fats and cholesterol.  Increasing your fiber intake by including  whole grains, vegetables, and fruits in your diet. Eating these foods may help lower blood pressure.  Maintaining a healthy weight.  Staying physically active and exercising regularly. SYMPTOMS  The symptoms of abdominal aortic aneurysm may vary depending on the size and rate of growth of the aneurysm.Most grow slowly and do not have any symptoms. When symptoms do occur, they may include:  Pain (abdomen, side, lower back, or groin). The pain may vary in intensity. A sudden onset of severe pain may indicate that the aneurysm has ruptured.  Feeling full after eating only small amounts of food.  Nausea or vomiting or both.  Feeling a pulsating lump in the abdomen.  Feeling faint or passing out. DIAGNOSIS  Since most unruptured abdominal aortic aneurysms have no symptoms, they are often discovered during diagnostic exams for other conditions. An aneurysm may be found during the following procedures:  Ultrasonography (A one-time screening for abdominal aortic aneurysm by ultrasonography is also recommended for all men aged 65-75 years who have ever smoked).  X-ray exams.  A computed tomography (CT).  Magnetic resonance imaging (MRI).  Angiography or arteriography. TREATMENT  Treatment of an abdominal aortic aneurysm depends on the size of your aneurysm, your age, and risk factors for rupture. Medication to control blood pressure and pain may be used to manage aneurysms smaller than 6 cm. Regular monitoring for enlargement may be recommended by your caregiver if:  The aneurysm is 3 4 cm in size (an annual ultrasonography may be recommended).  The aneurysm is 4 4.5 cm in size (an ultrasonography every 6 months may be recommended).  The aneurysm is larger than 4.5   cm in size (your caregiver may ask that you be examined by a vascular surgeon). If your aneurysm is larger than 6 cm, surgical repair may be recommended. There are two main methods for repair of an aneurysm:   Endovascular  repair (a minimally invasive surgery). This is done most often.  Open repair. This method is used if an endovascular repair is not possible. Document Released: 10/21/2004 Document Revised: 05/08/2012 Document Reviewed: 02/11/2012 Exit[employer] Patient Information 2014 [employer].   Smoking Cessation Quitting smoking is important to your health and has many advantages. However, it is not always easy to quit since nicotine is a very addictive drug. Often times, people try 3 times or more before being able to quit. This document explains the best ways for you to prepare to quit smoking. Quitting takes hard work and a lot of effort, but you can do it. ADVANTAGES OF QUITTING SMOKING  You will live longer, feel better, and live better.  Your body will feel the impact of quitting smoking almost immediately.  Within 20 minutes, blood pressure decreases. Your pulse returns to its normal level.  After 8 hours, carbon monoxide levels in the blood return to normal. Your oxygen level increases.  After 24 hours, the chance of having a heart attack starts to decrease. Your breath, hair, and body stop smelling like smoke.  After 48 hours, damaged nerve endings begin to recover. Your sense of taste and smell improve.  After 72 hours, the body is virtually free of nicotine. Your bronchial tubes relax and breathing becomes easier.  After 2 to 12 weeks, lungs can hold more air. Exercise becomes easier and circulation improves.  The risk of having a heart attack, stroke, cancer, or lung disease is greatly reduced.  After 1 year, the risk of coronary heart disease is cut in half.  After 5 years, the risk of stroke falls to the same as a nonsmoker.  After 10 years, the risk of lung cancer is cut in half and the risk of other cancers decreases significantly.  After 15 years, the risk of coronary heart disease drops, usually to the level of a nonsmoker.  If you are pregnant, quitting smoking will improve  your chances of having a healthy baby.  The people you live with, especially any children, will be healthier.  You will have extra money to spend on things other than cigarettes. QUESTIONS TO THINK ABOUT BEFORE ATTEMPTING TO QUIT You may want to talk about your answers with your caregiver.  Why do you want to quit?  If you tried to quit in the past, what helped and what did not?  What will be the most difficult situations for you after you quit? How will you plan to handle them?  Who can help you through the tough times? Your family? Friends? A caregiver?  What pleasures do you get from smoking? What ways can you still get pleasure if you quit? Here are some questions to ask your caregiver:  How can you help me to be successful at quitting?  What medicine do you think would be best for me and how should I take it?  What should I do if I need more help?  What is smoking withdrawal like? How can I get information on withdrawal? GET READY  Set a quit date.  Change your environment by getting rid of all cigarettes, ashtrays, matches, and lighters in your home, car, or work. Do not let people smoke in your home.  Review your   past attempts to quit. Think about what worked and what did not. GET SUPPORT AND ENCOURAGEMENT You have a better chance of being successful if you have help. You can get support in many ways.  Tell your family, friends, and co-workers that you are going to quit and need their support. Ask them not to smoke around you.  Get individual, group, or telephone counseling and support. Programs are available at local hospitals and health centers. Call your local health department for information about programs in your area.  Spiritual beliefs and practices may help some smokers quit.  Download a "quit meter" on your computer to keep track of quit statistics, such as how long you have gone without smoking, cigarettes not smoked, and money saved.  Get a self-help  book about quitting smoking and staying off of tobacco. LEARN NEW SKILLS AND BEHAVIORS  Distract yourself from urges to smoke. Talk to someone, go for a walk, or occupy your time with a task.  Change your normal routine. Take a different route to work. Drink tea instead of coffee. Eat breakfast in a different place.  Reduce your stress. Take a hot bath, exercise, or read a book.  Plan something enjoyable to do every day. Reward yourself for not smoking.  Explore interactive web-based programs that specialize in helping you quit. GET MEDICINE AND USE IT CORRECTLY Medicines can help you stop smoking and decrease the urge to smoke. Combining medicine with the above behavioral methods and support can greatly increase your chances of successfully quitting smoking.  Nicotine replacement therapy helps deliver nicotine to your body without the negative effects and risks of smoking. Nicotine replacement therapy includes nicotine gum, lozenges, inhalers, nasal sprays, and skin patches. Some may be available over-the-counter and others require a prescription.  Antidepressant medicine helps people abstain from smoking, but how this works is unknown. This medicine is available by prescription.  Nicotinic receptor partial agonist medicine simulates the effect of nicotine in your brain. This medicine is available by prescription. Ask your caregiver for advice about which medicines to use and how to use them based on your health history. Your caregiver will tell you what side effects to look out for if you choose to be on a medicine or therapy. Carefully read the information on the package. Do not use any other product containing nicotine while using a nicotine replacement product.  RELAPSE OR DIFFICULT SITUATIONS Most relapses occur within the first 3 months after quitting. Do not be discouraged if you start smoking again. Remember, most people try several times before finally quitting. You may have symptoms  of withdrawal because your body is used to nicotine. You may crave cigarettes, be irritable, feel very hungry, cough often, get headaches, or have difficulty concentrating. The withdrawal symptoms are only temporary. They are strongest when you first quit, but they will go away within 10 14 days. To reduce the chances of relapse, try to:  Avoid drinking alcohol. Drinking lowers your chances of successfully quitting.  Reduce the amount of caffeine you consume. Once you quit smoking, the amount of caffeine in your body increases and can give you symptoms, such as a rapid heartbeat, sweating, and anxiety.  Avoid smokers because they can make you want to smoke.  Do not let weight gain distract you. Many smokers will gain weight when they quit, usually less than 10 pounds. Eat a healthy diet and stay active. You can always lose the weight gained after you quit.  Find ways to improve your   mood other than smoking. FOR MORE INFORMATION  www.smokefree.gov  Document Released: 01/05/2001 Document Revised: 07/13/2011 Document Reviewed: 04/22/2011 [employer] Patient Information 2014 [employer].  

## 2012-12-28 NOTE — Progress Notes (Signed)
[facility]  Established Abdominal Aortic Aneurysm  History of Present Illness  [name] is a 66 y.o. (December 06, 1945) male patient that Dr. Darrick Penna has been following for a AAA. A year ago the maximum diameter was 3.56 x 3.59 proximally and 4.29 by 4.27 distally. Patient states he has chronic lumbar spine issues, has had ESI's for this which has helped his back pain in the remote past; he denies any abdominal pain.  The patient denies claudication in legs with walking, denies non-healing wounds. The patient reports history of stroke or TIA symptoms, states he is undergoing evaluation by a neurologist/psychiatrist for a possible series of TIA's. Patient states Dr. Jacinto Halim has been checking his carotid arteries by [place].  Pt Diabetic: Yes, on metformin, but states he was not told that he has DM Pt smoker: smoker  (7-9 cigarettes/day x 49 yrs)  Past Medical History  Diagnosis Date  . Hypertension   . Arthritis   . Coronary artery disease   . AAA (abdominal aortic aneurysm)   . Myocardial infarction   . High cholesterol   . Migraine   . Depression   . Anxiety   . Essential tremor 08/18/2012  . Mild cognitive impairment with memory loss 08/18/2012   Past Surgical History  Procedure Laterality Date  . Tonsillectomy    . Appendectomy     Social History History   Social History  . Marital Status: Divorced    Spouse Name: N/A    Number of Children: 1  . Years of Education: BS   Occupational History  . retired    Social History Main Topics  . Smoking status: Current Every Day Smoker  . Smokeless tobacco: Never Used  . Alcohol Use: Yes     Comment: Seldom  . Drug Use: No  . Sexual Activity: Not on file   Other Topics Concern  . Not on file   Social History Narrative  . No narrative on file   Family History No family history on file.  Current Outpatient Prescriptions on File Prior to Visit  Medication Sig Dispense Refill  . albuterol  (PROVENTIL HFA;VENTOLIN HFA) 108 (90 BASE) MCG/ACT inhaler Inhale 2 puffs into the lungs every 6 (six) hours as needed. For shortness of breath      . Cyanocobalamin (VITAMIN B 12 PO) Take 1,000 mcg by mouth daily.      Elwin Sleight 200-5 MCG/ACT AERO Twice daily.      . fexofenadine (ALLEGRA) 180 MG tablet Take 180 mg by mouth daily.      . fish oil-omega-3 fatty acids 1000 MG capsule Take 1 g by mouth 2 (two) times daily.      . furosemide (LASIX) 40 MG tablet Take 40 mg by mouth daily.      Marland Kitchen galantamine (RAZADYNE ER) 16 MG 24 hr capsule TAKE 1 CAPSULE BY MOUTH EVERY DAY  90 capsule  1  . ibuprofen (ADVIL,MOTRIN) 200 MG tablet Take 600 mg by mouth every 6 (six) hours as needed. For pain      . isosorbide mononitrate (IMDUR) 30 MG 24 hr tablet Take 30 mg by mouth daily.      . metoprolol succinate (TOPROL-XL) 25 MG 24 hr tablet Take 12.5 mg by mouth daily.      Marland Kitchen NAMENDA 10 MG tablet TAKE 1 TABLET BY MOUTH TWICE DAILY  180 tablet  1  . NASONEX 50 MCG/ACT nasal spray daily.      . niacin (NIASPAN)  1000 MG CR tablet Take 1,000 mg by mouth at bedtime.      . nitroGLYCERIN (NITROLINGUAL) 0.4 MG/SPRAY spray Place 1 spray under the tongue every 5 (five) minutes as needed. For chest pain      . pravastatin (PRAVACHOL) 80 MG tablet Take 80 mg by mouth at bedtime.      . ramipril (ALTACE) 10 MG capsule Take 10 mg by mouth daily.      Marland Kitchen venlafaxine XR (EFFEXOR-XR) 75 MG 24 hr capsule       . vitamin C (ASCORBIC ACID) 500 MG tablet Take 500 mg by mouth 2 (two) times daily.      . vitamin E 400 UNIT capsule Take 200 Units by mouth 2 (two) times daily.       No current facility-administered medications on file prior to visit.   Allergies  Allergen Reactions  . Amoxicillin Swelling  . Exelon [Rivastigmine Tartrate]     Patch  . Penicillins Swelling    ROS: [x]  Positive   [ ]  Negative   [ ]  All sytems reviewed and are negative  General: [ ]  Weight loss, [ ]  Fever, [ ]  chills Neurologic: [ ]   Dizziness, [ ]  Blackouts, [ ]  Seizure [ ]  Stroke, [ ]  "Mini stroke", [ ]  Slurred speech, [ ]  Temporary blindness; [ ]  weakness in arms or legs, [ ]  Hoarseness Cardiac: [ ]  Chest pain/pressure, [ ]  Shortness of breath at rest [ ]  Shortness of breath with exertion, [ ]  Atrial fibrillation or irregular heartbeat Vascular: [ ]  Pain in legs with walking, [ ]  Pain in legs at rest, [ ]  Pain in legs at night,  [ ]  Non-healing ulcer, [ ]  Blood clot in vein/DVT,   Pulmonary: [ ]  Home oxygen, [ ]  Productive cough, [ ]  Coughing up blood, [ ]  Asthma,  [ ]  Wheezing Musculoskeletal:  [ ]  Arthritis, [ ]  Low back pain, [ ]  Joint pain Hematologic: [ ]  Easy Bruising, [ ]  Anemia; [ ]  Hepatitis Gastrointestinal: [ ]  Blood in stool, [ ]  Gastroesophageal Reflux/heartburn, [ ]  Trouble swallowing Urinary: [ ]  chronic Kidney disease, [ ]  on HD - [ ]  MWF or [ ]  TTHS, [ ]  Burning with urination, [ ]  Difficulty urinating Skin: [ ]  Rashes, [ ]  Wounds Psychological: [ ]  Anxiety, [ ]  Depression  Physical Examination  Filed Vitals:   12/28/12 0946  BP: 150/71  Pulse: 49  Resp: 14   Filed Weights   12/28/12 0946  Weight: 195 lb (88.451 kg)   Body mass index is 26.82 kg/(m^2).  General: A&O x 3, WD.  Pulmonary: Sym exp, good air movt, CTAB, no rales, rhonchi, or wheezing.  Cardiac: RRR, Nl S1, S2, no Murmurs detected.  Carotid Bruits Left Right   Negative Negative   Aorta is not palpable. Radial pulses are 3+ and =                          VASCULAR EXAM:  LE Pulses LEFT RIGHT       POPLITEAL  not palpable   not palpable       POSTERIOR TIBIAL   palpable    palpable        DORSALIS PEDIS      ANTERIOR TIBIAL  palpable   palpable      Gastrointestinal: soft, NTND, -G/R, - HSM, - masses, - CVAT B.  Musculoskeletal: M/S 5/5 throughout, Extremities without ischemic changes.   Neurologic: CN 2-12  intact  except speech is halting, Pain and light touch intact in extremities, Motor exam as listed above.  Non-Invasive Vascular Imaging  AAA Duplex (12/28/2012)  Previous size: 4.29 cm (Date: 12/30/2011)  Current size:  4.95 cm (Date: 12/28/2012)  Medical Decision Making  The patient is a 67 y.o. male who presents with asymptomatic AAA with increasing size of 0.66 cm in one year at the widest dimension. Unfortunately he continues to smoke which is a risk factor for aneurysmal progression. The patient was counseled re smoking cessation.   Based on this patient's exam and diagnostic studies, the patient will follow up in 6 months  with the following studies: AAA Duplex.  The threshold for repair is AAA size > 5.5 cm, growth > 1 cm/yr, and symptomatic status.  I emphasized the importance of maximal medical management including strict control of blood pressure, blood glucose, and lipid levels, antiplatelet agents, obtaining regular exercise, and cessation of smoking.   The patient was given information about AAA including signs, symptoms, treatment, and how to minimize the risk of enlargement and rupture of aneurysms.    The patient was advised to call 911 should the patient experience sudden onset abdominal or back pain.   Thank you for allowing [place] to participate in this patient's care.  Wyatt March, RN, MSN, FNP-C [facility] Office: [phone]  Clinic Physician: Darrick Penna  12/28/2012, 9:07 AM

## 2012-12-29 DIAGNOSIS — G3184 Mild cognitive impairment, so stated: Secondary | ICD-10-CM

## 2012-12-29 DIAGNOSIS — F341 Dysthymic disorder: Secondary | ICD-10-CM

## 2013-02-15 ENCOUNTER — Encounter (INDEPENDENT_AMBULATORY_CARE_PROVIDER_SITE_OTHER): Payer: Self-pay

## 2013-02-15 ENCOUNTER — Encounter: Payer: Self-pay | Admitting: Neurology

## 2013-02-15 ENCOUNTER — Ambulatory Visit (INDEPENDENT_AMBULATORY_CARE_PROVIDER_SITE_OTHER): Payer: Medicare Other | Admitting: Neurology

## 2013-02-15 VITALS — BP 121/75 | HR 60 | Resp 18 | Ht 70.0 in | Wt 194.0 lb

## 2013-02-15 DIAGNOSIS — R413 Other amnesia: Secondary | ICD-10-CM

## 2013-02-15 DIAGNOSIS — G3184 Mild cognitive impairment, so stated: Secondary | ICD-10-CM

## 2013-02-15 MED ORDER — B COMPLEX PO TABS: 1.0000 | ORAL_TABLET | Freq: Every day | ORAL | Status: DC

## 2013-02-15 MED ORDER — GALANTAMINE HYDROBROMIDE ER 16 MG PO CP24: 16.0000 mg | ORAL_CAPSULE | Freq: Every day | ORAL | Status: DC

## 2013-02-15 MED ORDER — MEMANTINE HCL 10 MG PO TABS: 10.0000 mg | ORAL_TABLET | Freq: Two times a day (BID) | ORAL | Status: DC
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
## 2013-04-20 ENCOUNTER — Other Ambulatory Visit: Payer: Self-pay | Admitting: Neurology

## 2013-07-04 ENCOUNTER — Encounter: Payer: Self-pay | Admitting: Family

## 2013-07-05 ENCOUNTER — Ambulatory Visit (INDEPENDENT_AMBULATORY_CARE_PROVIDER_SITE_OTHER): Payer: Medicare Other | Admitting: Family

## 2013-07-05 ENCOUNTER — Encounter: Payer: Self-pay | Admitting: Family

## 2013-07-05 ENCOUNTER — Ambulatory Visit (HOSPITAL_COMMUNITY)
Admission: RE | Admit: 2013-07-05 | Discharge: 2013-07-05 | Disposition: A | Discharge status: Home / Self Care | Payer: Medicare Other | Source: Ambulatory Visit | Attending: Family | Admitting: Family

## 2013-07-05 VITALS — BP 132/74 | HR 45 | Resp 14 | Ht 72.0 in | Wt 189.0 lb

## 2013-07-05 DIAGNOSIS — I723 Aneurysm of iliac artery: Secondary | ICD-10-CM | POA: Diagnosis not present

## 2013-07-05 DIAGNOSIS — I714 Abdominal aortic aneurysm, without rupture, unspecified: Secondary | ICD-10-CM | POA: Diagnosis present

## 2013-07-05 NOTE — Patient Instructions (Signed)
Abdominal Aortic Aneurysm An aneurysm is a weakened or damaged part of an artery wall that bulges from the normal force of blood pumping through the body. An abdominal aortic aneurysm is an aneurysm that occurs in the lower part of the aorta, the main artery of the body.  The major concern with an abdominal aortic aneurysm is that it can enlarge and burst (rupture) or blood can flow between the layers of the wall of the aorta through a tear (aorticdissection). Both of these conditions can cause bleeding inside the body and can be life threatening unless diagnosed and treated promptly. CAUSES  The exact cause of an abdominal aortic aneurysm is unknown. Some contributing factors are:   A hardening of the arteries caused by the buildup of fat and other substances in the lining of a blood vessel (arteriosclerosis).  Inflammation of the walls of an artery (arteritis).   Connective tissue diseases, such as Marfan syndrome.   Abdominal trauma.   An infection, such as syphilis or staphylococcus, in the wall of the aorta (infectious aortitis) caused by bacteria. RISK FACTORS  Risk factors that contribute to an abdominal aortic aneurysm may include:  Age older than 60 years.   High blood pressure (hypertension).  Male gender.  Ethnicity (white race).  Obesity.  Family history of aneurysm (first degree relatives only).  Tobacco use. PREVENTION  The following healthy lifestyle habits may help decrease your risk of abdominal aortic aneurysm:  Quitting smoking. Smoking can raise your blood pressure and cause arteriosclerosis.  Limiting or avoiding alcohol.  Keeping your blood pressure, blood sugar level, and cholesterol levels within normal limits.  Decreasing your salt intake. In somepeople, too much salt can raise blood pressure and increase your risk of abdominal aortic aneurysm.  Eating a diet low in saturated fats and cholesterol.  Increasing your fiber intake by including  whole grains, vegetables, and fruits in your diet. Eating these foods may help lower blood pressure.  Maintaining a healthy weight.  Staying physically active and exercising regularly. SYMPTOMS  The symptoms of abdominal aortic aneurysm may vary depending on the size and rate of growth of the aneurysm.Most grow slowly and do not have any symptoms. When symptoms do occur, they may include:  Pain (abdomen, side, lower back, or groin). The pain may vary in intensity. A sudden onset of severe pain may indicate that the aneurysm has ruptured.  Feeling full after eating only small amounts of food.  Nausea or vomiting or both.  Feeling a pulsating lump in the abdomen.  Feeling faint or passing out. DIAGNOSIS  Since most unruptured abdominal aortic aneurysms have no symptoms, they are often discovered during diagnostic exams for other conditions. An aneurysm may be found during the following procedures:  Ultrasonography (A one-time screening for abdominal aortic aneurysm by ultrasonography is also recommended for all men aged 65-75 years who have ever smoked).  X-ray exams.  A computed tomography (CT).  Magnetic resonance imaging (MRI).  Angiography or arteriography. TREATMENT  Treatment of an abdominal aortic aneurysm depends on the size of your aneurysm, your age, and risk factors for rupture. Medication to control blood pressure and pain may be used to manage aneurysms smaller than 6 cm. Regular monitoring for enlargement may be recommended by your caregiver if:  The aneurysm is 3 4 cm in size (an annual ultrasonography may be recommended).  The aneurysm is 4 4.5 cm in size (an ultrasonography every 6 months may be recommended).  The aneurysm is larger than 4.5   cm in size (your caregiver may ask that you be examined by a vascular surgeon). If your aneurysm is larger than 6 cm, surgical repair may be recommended. There are two main methods for repair of an aneurysm:   Endovascular  repair (a minimally invasive surgery). This is done most often.  Open repair. This method is used if an endovascular repair is not possible. Document Released: 10/21/2004 Document Revised: 05/08/2012 Document Reviewed: 02/11/2012 ExitCare Patient Information 2014 [employer].   Smoking Cessation Quitting smoking is important to your health and has many advantages. However, it is not always easy to quit since nicotine is a very addictive drug. Often times, people try 3 times or more before being able to quit. This document explains the best ways for you to prepare to quit smoking. Quitting takes hard work and a lot of effort, but you can do it. ADVANTAGES OF QUITTING SMOKING  You will live longer, feel better, and live better.  Your body will feel the impact of quitting smoking almost immediately.  Within 20 minutes, blood pressure decreases. Your pulse returns to its normal level.  After 8 hours, carbon monoxide levels in the blood return to normal. Your oxygen level increases.  After 24 hours, the chance of having a heart attack starts to decrease. Your breath, hair, and body stop smelling like smoke.  After 48 hours, damaged nerve endings begin to recover. Your sense of taste and smell improve.  After 72 hours, the body is virtually free of nicotine. Your bronchial tubes relax and breathing becomes easier.  After 2 to 12 weeks, lungs can hold more air. Exercise becomes easier and circulation improves.  The risk of having a heart attack, stroke, cancer, or lung disease is greatly reduced.  After 1 year, the risk of coronary heart disease is cut in half.  After 5 years, the risk of stroke falls to the same as a nonsmoker.  After 10 years, the risk of lung cancer is cut in half and the risk of other cancers decreases significantly.  After 15 years, the risk of coronary heart disease drops, usually to the level of a nonsmoker.  If you are pregnant, quitting smoking will improve  your chances of having a healthy baby.  The people you live with, especially any children, will be healthier.  You will have extra money to spend on things other than cigarettes. QUESTIONS TO THINK ABOUT BEFORE ATTEMPTING TO QUIT You may want to talk about your answers with your caregiver.  Why do you want to quit?  If you tried to quit in the past, what helped and what did not?  What will be the most difficult situations for you after you quit? How will you plan to handle them?  Who can help you through the tough times? Your family? Friends? A caregiver?  What pleasures do you get from smoking? What ways can you still get pleasure if you quit? Here are some questions to ask your caregiver:  How can you help me to be successful at quitting?  What medicine do you think would be best for me and how should I take it?  What should I do if I need more help?  What is smoking withdrawal like? How can I get information on withdrawal? GET READY  Set a quit date.  Change your environment by getting rid of all cigarettes, ashtrays, matches, and lighters in your home, car, or work. Do not let people smoke in your home.  Review your   past attempts to quit. Think about what worked and what did not. GET SUPPORT AND ENCOURAGEMENT You have a better chance of being successful if you have help. You can get support in many ways.  Tell your family, friends, and co-workers that you are going to quit and need their support. Ask them not to smoke around you.  Get individual, group, or telephone counseling and support. Programs are available at local hospitals and health centers. Call your local health department for information about programs in your area.  Spiritual beliefs and practices may help some smokers quit.  Download a "quit meter" on your computer to keep track of quit statistics, such as how long you have gone without smoking, cigarettes not smoked, and money saved.  Get a self-help  book about quitting smoking and staying off of tobacco. LEARN NEW SKILLS AND BEHAVIORS  Distract yourself from urges to smoke. Talk to someone, go for a walk, or occupy your time with a task.  Change your normal routine. Take a different route to work. Drink tea instead of coffee. Eat breakfast in a different place.  Reduce your stress. Take a hot bath, exercise, or read a book.  Plan something enjoyable to do every day. Reward yourself for not smoking.  Explore interactive web-based programs that specialize in helping you quit. GET MEDICINE AND USE IT CORRECTLY Medicines can help you stop smoking and decrease the urge to smoke. Combining medicine with the above behavioral methods and support can greatly increase your chances of successfully quitting smoking.  Nicotine replacement therapy helps deliver nicotine to your body without the negative effects and risks of smoking. Nicotine replacement therapy includes nicotine gum, lozenges, inhalers, nasal sprays, and skin patches. Some may be available over-the-counter and others require a prescription.  Antidepressant medicine helps people abstain from smoking, but how this works is unknown. This medicine is available by prescription.  Nicotinic receptor partial agonist medicine simulates the effect of nicotine in your brain. This medicine is available by prescription. Ask your caregiver for advice about which medicines to use and how to use them based on your health history. Your caregiver will tell you what side effects to look out for if you choose to be on a medicine or therapy. Carefully read the information on the package. Do not use any other product containing nicotine while using a nicotine replacement product.  RELAPSE OR DIFFICULT SITUATIONS Most relapses occur within the first 3 months after quitting. Do not be discouraged if you start smoking again. Remember, most people try several times before finally quitting. You may have symptoms  of withdrawal because your body is used to nicotine. You may crave cigarettes, be irritable, feel very hungry, cough often, get headaches, or have difficulty concentrating. The withdrawal symptoms are only temporary. They are strongest when you first quit, but they will go away within 10 14 days. To reduce the chances of relapse, try to:  Avoid drinking alcohol. Drinking lowers your chances of successfully quitting.  Reduce the amount of caffeine you consume. Once you quit smoking, the amount of caffeine in your body increases and can give you symptoms, such as a rapid heartbeat, sweating, and anxiety.  Avoid smokers because they can make you want to smoke.  Do not let weight gain distract you. Many smokers will gain weight when they quit, usually less than 10 pounds. Eat a healthy diet and stay active. You can always lose the weight gained after you quit.  Find ways to improve your   mood other than smoking. FOR MORE INFORMATION  www.smokefree.gov  Document Released: 01/05/2001 Document Revised: 07/13/2011 Document Reviewed: 04/22/2011 [employer] Patient Information 2014 [employer].  

## 2013-07-05 NOTE — Progress Notes (Signed)
[facility] [place]  Established Abdominal Aortic Aneurysm  History of Present Illness  Wyatt CarrelRalph C Saunders is a 68 y.o. (07/31/1945) male patient that Dr. Darrick PennaFields has been following for a AAA.  Patient states he has chronic lumbar spine issues which has not been as bad lately, alleviated by lidoderm patches, has had ESI's for this which has helped his back pain in the remote past; he denies any abdominal pain, he denies any new back pain.  The patient denies claudication in legs with walking, denies non-healing wounds.  The patient reports history of stroke or TIA symptoms, states he had evaluation by a neurologist/psychiatrist for a possible series of TIA's, pt states she saw a spot on his brain that may have been an old small stroke.  Patient states Dr. Jacinto HalimGanji has been checking his carotid arteries by US.  He takes a daily 325 mg ASA and statin, no other blood thinners.  Pt Diabetic: Yes, on metformin  Pt smoker: smoker (4-5 cigarettes/day x 49 yrs), decreased from 7-9/day six months ago; states he will quit in 3 days.   Past Medical History  Diagnosis Date  . Hypertension   . Arthritis   . Coronary artery disease   . AAA (abdominal aortic aneurysm)   . Myocardial infarction   . High cholesterol   . Migraine   . Depression   . Anxiety   . Essential tremor 08/18/2012  . Mild cognitive impairment with memory loss 08/18/2012   Past Surgical History  Procedure Laterality Date  . Tonsillectomy    . Appendectomy     Social History History   Social History  . Marital Status: Divorced    Spouse Name: N/A    Number of Children: 1  . Years of Education: BS   Occupational History  . retired    Social History Main Topics  . Smoking status: Light Tobacco Smoker  . Smokeless tobacco: Never Used  . Alcohol Use: Yes     Comment: Seldom  . Drug Use: No  . Sexual Activity: Not on file   Other Topics Concern  . Not on file   Social History Narrative   Patient is  divorced.   Patient has one child.   Patient is retired.   Patient has a BS degree in LobbyistComputer Science.   Patient is right-handed.   Patient drinks soda, coffee and tea- about 3-4 cups daily.         Family History Family History  Problem Relation Age of Onset  . Cancer Paternal Grandmother     Current Outpatient Prescriptions on File Prior to Visit  Medication Sig Dispense Refill  . b complex vitamins tablet Take 1 tablet by mouth daily.  90 tablet  3  . Cyanocobalamin (VITAMIN B 12 PO) Take 1,000 mcg by mouth daily.      Elwin Sleight. DULERA 200-5 MCG/ACT AERO Twice daily.      . fexofenadine (ALLEGRA) 180 MG tablet Take 180 mg by mouth daily.      . fish oil-omega-3 fatty acids 1000 MG capsule Take 1 g by mouth 2 (two) times daily.      . furosemide (LASIX) 40 MG tablet Take 40 mg by mouth daily.      Marland Kitchen. galantamine (RAZADYNE ER) 16 MG 24 hr capsule Take 1 capsule (16 mg total) by mouth daily with breakfast.  90 capsule  3  . ibuprofen (ADVIL,MOTRIN) 200 MG tablet Take 600 mg by mouth every 6 (six) hours as  needed. For pain      . isosorbide mononitrate (IMDUR) 30 MG 24 hr tablet Take 30 mg by mouth daily.      . memantine (NAMENDA) 10 MG tablet Take 1 tablet (10 mg total) by mouth 2 (two) times daily.  180 tablet  3  . metoprolol succinate (TOPROL-XL) 25 MG 24 hr tablet Take 12.5 mg by mouth daily.      Marland Kitchen NASONEX 50 MCG/ACT nasal spray daily.      . nitroGLYCERIN (NITROLINGUAL) 0.4 MG/SPRAY spray Place 1 spray under the tongue every 5 (five) minutes as needed. For chest pain      . pravastatin (PRAVACHOL) 80 MG tablet Take 80 mg by mouth at bedtime.      . ramipril (ALTACE) 10 MG capsule Take 10 mg by mouth daily.      Marland Kitchen venlafaxine XR (EFFEXOR-XR) 75 MG 24 hr capsule       . vitamin C (ASCORBIC ACID) 500 MG tablet Take 500 mg by mouth 2 (two) times daily.      . vitamin E 400 UNIT capsule Take 200 Units by mouth 2 (two) times daily.      Marland Kitchen galantamine (RAZADYNE ER) 16 MG 24 hr capsule TAKE  1 CAPSULE BY MOUTH EVERY DAY  90 capsule  3   No current facility-administered medications on file prior to visit.   Allergies  Allergen Reactions  . Amoxicillin Swelling  . Exelon [Rivastigmine Tartrate]     Patch  . Penicillins Swelling    ROS: See HPI for pertinent positives and negatives.  Physical Examination  Filed Vitals:   07/05/13 0958  BP: 132/74  Pulse: 45  Resp: 14  Height: 6' (1.829 m)  Weight: 189 lb (85.73 kg)  SpO2: 98%   Body mass index is 25.63 kg/(m^2).  General: A&O x 3, WD.  Pulmonary: Sym exp, good air movt, CTAB, no rales, rhonchi, or wheezing.  Cardiac: RRR, Nl S1, S2, no Murmurs detected.  Carotid Bruits  Left  Right    Negative  Negative   Aorta is not palpable.  Radial pulses are 3+ and =  VASCULAR EXAM:  LE Pulses  LEFT  RIGHT   FEMORAL 3+palpable 3+ palpable  POPLITEAL  not palpable  not palpable   POSTERIOR TIBIAL  3+palpable  3+palpable   DORSALIS PEDIS  ANTERIOR TIBIAL  3+palpable  3+palpable   Gastrointestinal: soft, NTND, -G/R, - HSM, - masses, - CVAT B.  Musculoskeletal: M/S 5/5 throughout, Extremities without ischemic changes.  Neurologic: CN 2-12 intact except speech is halting, Pain and light touch intact in extremities, Motor exam as listed above.    Non-Invasive Vascular Imaging  AAA Duplex (07/05/2013) ABDOMINAL AORTA DUPLEX EVALUATION    INDICATION: Follow up aneurysm    PREVIOUS INTERVENTION(S): None    DUPLEX EXAM: Limited aorta duplex    LOCATION DIAMETER AP (cm) DIAMETER TRANSVERSE (cm) VELOCITIES (cm/sec)  Aorta Proximal 3.7 3.7 84  Aorta Mid 4.7 - 90  Aorta Distal 4.8 4.6 31  Right Common Iliac Artery 1.2 - 128  Left Common Iliac Artery 1.3 1.2 73    Previous max aortic diameter:  4.8 x 4.9 Date: 12/28/2012     ADDITIONAL FINDINGS: Residual lumen of 2.2 cm.    IMPRESSION: The largest diameter  is in the distal aorta measuring 4.8 x 4.6 cm.     Compared to the previous exam:  No significant change.      Medical Decision Making  The patient is a  68 y.o. male who presents with asymptomatic AAA with no increase in size.  He was again counseled re smoking cessation and plans to quit in 3 days.   Based on this patient's exam and diagnostic studies, the patient will follow up in 6 months  with the following studies: AAA Duplex.  Consideration for repair of AAA would be made when the size is 5.5 cm, growth > 1 cm/yr, and symptomatic status.  I emphasized the importance of maximal medical management including strict control of blood pressure, blood glucose, and lipid levels, antiplatelet agents, obtaining regular exercise, and  cessation of smoking.   The patient was given information about AAA including signs, symptoms, treatment, and how to minimize the risk of enlargement and rupture of aneurysms.    The patient was advised to call 911 should the patient experience sudden onset abdominal or back pain.   Thank you for allowing [place] to participate in this patient's care.  Charisse March, RN, MSN, FNP-C [facility] [place] Office: [phone]  Clinic Physician: Darrick Penna  07/05/2013, 10:12 AM

## 2014-01-09 ENCOUNTER — Encounter: Payer: Self-pay | Admitting: Family

## 2014-01-10 ENCOUNTER — Ambulatory Visit (HOSPITAL_COMMUNITY)
Admission: RE | Admit: 2014-01-10 | Discharge: 2014-01-10 | Disposition: A | Discharge status: Home / Self Care | Payer: Medicare Other | Source: Ambulatory Visit | Attending: Family | Admitting: Family

## 2014-01-10 ENCOUNTER — Encounter: Payer: Self-pay | Admitting: Family

## 2014-01-10 ENCOUNTER — Ambulatory Visit (INDEPENDENT_AMBULATORY_CARE_PROVIDER_SITE_OTHER): Payer: Medicare Other | Admitting: Family

## 2014-01-10 VITALS — BP 142/74 | HR 51 | Resp 16 | Ht 72.0 in | Wt 205.0 lb

## 2014-01-10 DIAGNOSIS — I714 Abdominal aortic aneurysm, without rupture, unspecified: Secondary | ICD-10-CM

## 2014-01-10 NOTE — Addendum Note (Signed)
Addended by: Sharee PimpleMCCHESNEY, MARILYN K on: 01/10/2014 12:25 PM   Modules accepted: Orders

## 2014-01-10 NOTE — Patient Instructions (Signed)
Abdominal Aortic Aneurysm An aneurysm is a weakened or damaged part of an artery wall that bulges from the normal force of blood pumping through the body. An abdominal aortic aneurysm is an aneurysm that occurs in the lower part of the aorta, the main artery of the body.  The major concern with an abdominal aortic aneurysm is that it can enlarge and burst (rupture) or blood can flow between the layers of the wall of the aorta through a tear (aorticdissection). Both of these conditions can cause bleeding inside the body and can be life threatening unless diagnosed and treated promptly. CAUSES  The exact cause of an abdominal aortic aneurysm is unknown. Some contributing factors are:   A hardening of the arteries caused by the buildup of fat and other substances in the lining of a blood vessel (arteriosclerosis).  Inflammation of the walls of an artery (arteritis).   Connective tissue diseases, such as Marfan syndrome.   Abdominal trauma.   An infection, such as syphilis or staphylococcus, in the wall of the aorta (infectious aortitis) caused by bacteria. RISK FACTORS  Risk factors that contribute to an abdominal aortic aneurysm may include:  Age older than 60 years.   High blood pressure (hypertension).  Male gender.  Ethnicity (white race).  Obesity.  Family history of aneurysm (first degree relatives only).  Tobacco use. PREVENTION  The following healthy lifestyle habits may help decrease your risk of abdominal aortic aneurysm:  Quitting smoking. Smoking can raise your blood pressure and cause arteriosclerosis.  Limiting or avoiding alcohol.  Keeping your blood pressure, blood sugar level, and cholesterol levels within normal limits.  Decreasing your salt intake. In somepeople, too much salt can raise blood pressure and increase your risk of abdominal aortic aneurysm.  Eating a diet low in saturated fats and cholesterol.  Increasing your fiber intake by including  whole grains, vegetables, and fruits in your diet. Eating these foods may help lower blood pressure.  Maintaining a healthy weight.  Staying physically active and exercising regularly. SYMPTOMS  The symptoms of abdominal aortic aneurysm may vary depending on the size and rate of growth of the aneurysm.Most grow slowly and do not have any symptoms. When symptoms do occur, they may include:  Pain (abdomen, side, lower back, or groin). The pain may vary in intensity. A sudden onset of severe pain may indicate that the aneurysm has ruptured.  Feeling full after eating only small amounts of food.  Nausea or vomiting or both.  Feeling a pulsating lump in the abdomen.  Feeling faint or passing out. DIAGNOSIS  Since most unruptured abdominal aortic aneurysms have no symptoms, they are often discovered during diagnostic exams for other conditions. An aneurysm may be found during the following procedures:  Ultrasonography (A one-time screening for abdominal aortic aneurysm by ultrasonography is also recommended for all men aged 65-75 years who have ever smoked).  X-ray exams.  A computed tomography (CT).  Magnetic resonance imaging (MRI).  Angiography or arteriography. TREATMENT  Treatment of an abdominal aortic aneurysm depends on the size of your aneurysm, your age, and risk factors for rupture. Medication to control blood pressure and pain may be used to manage aneurysms smaller than 6 cm. Regular monitoring for enlargement may be recommended by your caregiver if:  The aneurysm is 3-4 cm in size (an annual ultrasonography may be recommended).  The aneurysm is 4-4.5 cm in size (an ultrasonography every 6 months may be recommended).  The aneurysm is larger than 4.5 cm in   size (your caregiver may ask that you be examined by a vascular surgeon). If your aneurysm is larger than 6 cm, surgical repair may be recommended. There are two main methods for repair of an aneurysm:   Endovascular  repair (a minimally invasive surgery). This is done most often.  Open repair. This method is used if an endovascular repair is not possible. Document Released: 10/21/2004 Document Revised: 05/08/2012 Document Reviewed: 02/11/2012 ExitCare Patient Information 2015 [employer]. This information is not intended to replace advice given to you by your health care provider. Make sure you discuss any questions you have with your health care provider.   Smoking Cessation Quitting smoking is important to your health and has many advantages. However, it is not always easy to quit since nicotine is a very addictive drug. Oftentimes, people try 3 times or more before being able to quit. This document explains the best ways for you to prepare to quit smoking. Quitting takes hard work and a lot of effort, but you can do it. ADVANTAGES OF QUITTING SMOKING  You will live longer, feel better, and live better.  Your body will feel the impact of quitting smoking almost immediately.  Within 20 minutes, blood pressure decreases. Your pulse returns to its normal level.  After 8 hours, carbon monoxide levels in the blood return to normal. Your oxygen level increases.  After 24 hours, the chance of having a heart attack starts to decrease. Your breath, hair, and body stop smelling like smoke.  After 48 hours, damaged nerve endings begin to recover. Your sense of taste and smell improve.  After 72 hours, the body is virtually free of nicotine. Your bronchial tubes relax and breathing becomes easier.  After 2 to 12 weeks, lungs can hold more air. Exercise becomes easier and circulation improves.  The risk of having a heart attack, stroke, cancer, or lung disease is greatly reduced.  After 1 year, the risk of coronary heart disease is cut in half.  After 5 years, the risk of stroke falls to the same as a nonsmoker.  After 10 years, the risk of lung cancer is cut in half and the risk of other cancers  decreases significantly.  After 15 years, the risk of coronary heart disease drops, usually to the level of a nonsmoker.  If you are pregnant, quitting smoking will improve your chances of having a healthy baby.  The people you live with, especially any children, will be healthier.  You will have extra money to spend on things other than cigarettes. QUESTIONS TO THINK ABOUT BEFORE ATTEMPTING TO QUIT You may want to talk about your answers with your health care provider.  Why do you want to quit?  If you tried to quit in the past, what helped and what did not?  What will be the most difficult situations for you after you quit? How will you plan to handle them?  Who can help you through the tough times? Your family? Friends? A health care provider?  What pleasures do you get from smoking? What ways can you still get pleasure if you quit? Here are some questions to ask your health care provider:  How can you help me to be successful at quitting?  What medicine do you think would be best for me and how should I take it?  What should I do if I need more help?  What is smoking withdrawal like? How can I get information on withdrawal? GET READY  Set a quit   date.  Change your environment by getting rid of all cigarettes, ashtrays, matches, and lighters in your home, car, or work. Do not let people smoke in your home.  Review your past attempts to quit. Think about what worked and what did not. GET SUPPORT AND ENCOURAGEMENT You have a better chance of being successful if you have help. You can get support in many ways.  Tell your family, friends, and coworkers that you are going to quit and need their support. Ask them not to smoke around you.  Get individual, group, or telephone counseling and support. Programs are available at local hospitals and health centers. Call your local health department for information about programs in your area.  Spiritual beliefs and practices may  help some smokers quit.  Download a "quit meter" on your computer to keep track of quit statistics, such as how long you have gone without smoking, cigarettes not smoked, and money saved.  Get a self-help book about quitting smoking and staying off tobacco. LEARN NEW SKILLS AND BEHAVIORS  Distract yourself from urges to smoke. Talk to someone, go for a walk, or occupy your time with a task.  Change your normal routine. Take a different route to work. Drink tea instead of coffee. Eat breakfast in a different place.  Reduce your stress. Take a hot bath, exercise, or read a book.  Plan something enjoyable to do every day. Reward yourself for not smoking.  Explore interactive web-based programs that specialize in helping you quit. GET MEDICINE AND USE IT CORRECTLY Medicines can help you stop smoking and decrease the urge to smoke. Combining medicine with the above behavioral methods and support can greatly increase your chances of successfully quitting smoking.  Nicotine replacement therapy helps deliver nicotine to your body without the negative effects and risks of smoking. Nicotine replacement therapy includes nicotine gum, lozenges, inhalers, nasal sprays, and skin patches. Some may be available over-the-counter and others require a prescription.  Antidepressant medicine helps people abstain from smoking, but how this works is unknown. This medicine is available by prescription.  Nicotinic receptor partial agonist medicine simulates the effect of nicotine in your brain. This medicine is available by prescription. Ask your health care provider for advice about which medicines to use and how to use them based on your health history. Your health care provider will tell you what side effects to look out for if you choose to be on a medicine or therapy. Carefully read the information on the package. Do not use any other product containing nicotine while using a nicotine replacement product.    RELAPSE OR DIFFICULT SITUATIONS Most relapses occur within the first 3 months after quitting. Do not be discouraged if you start smoking again. Remember, most people try several times before finally quitting. You may have symptoms of withdrawal because your body is used to nicotine. You may crave cigarettes, be irritable, feel very hungry, cough often, get headaches, or have difficulty concentrating. The withdrawal symptoms are only temporary. They are strongest when you first quit, but they will go away within 10-14 days. To reduce the chances of relapse, try to:  Avoid drinking alcohol. Drinking lowers your chances of successfully quitting.  Reduce the amount of caffeine you consume. Once you quit smoking, the amount of caffeine in your body increases and can give you symptoms, such as a rapid heartbeat, sweating, and anxiety.  Avoid smokers because they can make you want to smoke.  Do not let weight gain distract you. Many   smokers will gain weight when they quit, usually less than 10 pounds. Eat a healthy diet and stay active. You can always lose the weight gained after you quit.  Find ways to improve your mood other than smoking. FOR MORE INFORMATION  www.smokefree.gov  Document Released: 01/05/2001 Document Revised: 05/28/2013 Document Reviewed: 04/22/2011 ExitCare Patient Information 2015 [employer]. This information is not intended to replace advice given to you by your health care provider. Make sure you discuss any questions you have with your health care provider.   Smoking Cessation, Tips for Success If you are ready to quit smoking, congratulations! You have chosen to help yourself be healthier. Cigarettes bring nicotine, tar, carbon monoxide, and other irritants into your body. Your lungs, heart, and blood vessels will be able to work better without these poisons. There are many different ways to quit smoking. Nicotine gum, nicotine patches, a nicotine inhaler, or nicotine  nasal spray can help with physical craving. Hypnosis, support groups, and medicines help break the habit of smoking. WHAT THINGS CAN I DO TO MAKE QUITTING EASIER?  Here are some tips to help you quit for good:  Pick a date when you will quit smoking completely. Tell all of your friends and family about your plan to quit on that date.  Do not try to slowly cut down on the number of cigarettes you are smoking. Pick a quit date and quit smoking completely starting on that day.  Throw away all cigarettes.   Clean and remove all ashtrays from your home, work, and car.  On a card, write down your reasons for quitting. Carry the card with you and read it when you get the urge to smoke.  Cleanse your body of nicotine. Drink enough water and fluids to keep your urine clear or pale yellow. Do this after quitting to flush the nicotine from your body.  Learn to predict your moods. Do not let a bad situation be your excuse to have a cigarette. Some situations in your life might tempt you into wanting a cigarette.  Never have "just one" cigarette. It leads to wanting another and another. Remind yourself of your decision to quit.  Change habits associated with smoking. If you smoked while driving or when feeling stressed, try other activities to replace smoking. Stand up when drinking your coffee. Brush your teeth after eating. Sit in a different chair when you read the paper. Avoid alcohol while trying to quit, and try to drink fewer caffeinated beverages. Alcohol and caffeine may urge you to smoke.  Avoid foods and drinks that can trigger a desire to smoke, such as sugary or spicy foods and alcohol.  Ask people who smoke not to smoke around you.  Have something planned to do right after eating or having a cup of coffee. For example, plan to take a walk or exercise.  Try a relaxation exercise to calm you down and decrease your stress. Remember, you may be tense and nervous for the first 2 weeks after  you quit, but this will pass.  Find new activities to keep your hands busy. Play with a pen, coin, or rubber band. Doodle or draw things on paper.  Brush your teeth right after eating. This will help cut down on the craving for the taste of tobacco after meals. You can also try mouthwash.   Use oral substitutes in place of cigarettes. Try using lemon drops, carrots, cinnamon sticks, or chewing gum. Keep them handy so they are available when you   have the urge to smoke.  When you have the urge to smoke, try deep breathing.  Designate your home as a nonsmoking area.  If you are a heavy smoker, ask your health care provider about a prescription for nicotine chewing gum. It can ease your withdrawal from nicotine.  Reward yourself. Set aside the cigarette money you save and buy yourself something nice.  Look for support from others. Join a support group or smoking cessation program. Ask someone at home or at work to help you with your plan to quit smoking.  Always ask yourself, "Do I need this cigarette or is this just a reflex?" Tell yourself, "Today, I choose not to smoke," or "I do not want to smoke." You are reminding yourself of your decision to quit.  Do not replace cigarette smoking with electronic cigarettes (commonly called e-cigarettes). The safety of e-cigarettes is unknown, and some may contain harmful chemicals.  If you relapse, do not give up! Plan ahead and think about what you will do the next time you get the urge to smoke. HOW WILL I FEEL WHEN I QUIT SMOKING? You may have symptoms of withdrawal because your body is used to nicotine (the addictive substance in cigarettes). You may crave cigarettes, be irritable, feel very hungry, cough often, get headaches, or have difficulty concentrating. The withdrawal symptoms are only temporary. They are strongest when you first quit but will go away within 10-14 days. When withdrawal symptoms occur, stay in control. Think about your reasons  for quitting. Remind yourself that these are signs that your body is healing and getting used to being without cigarettes. Remember that withdrawal symptoms are easier to treat than the major diseases that smoking can cause.  Even after the withdrawal is over, expect periodic urges to smoke. However, these cravings are generally short lived and will go away whether you smoke or not. Do not smoke! WHAT RESOURCES ARE AVAILABLE TO HELP ME QUIT SMOKING? Your health care provider can direct you to community resources or hospitals for support, which may include:  Group support.  Education.  Hypnosis.  Therapy. Document Released: 10/10/2003 Document Revised: 05/28/2013 Document Reviewed: 06/29/2012 ExitCare Patient Information 2015 [employer]. This information is not intended to replace advice given to you by your health care provider. Make sure you discuss any questions you have with your health care provider.   

## 2014-01-10 NOTE — Progress Notes (Signed)
[facility] [place]  Established Abdominal Aortic Aneurysm  History of Present Illness  Jaci CarrelRalph C Convey is a 68 y.o. (09/16/1945) male  patient that Dr. Darrick PennaFields has been following for a AAA. He returns today for follow up.  Patient states he has chronic lumbar spine issues which has not been as bad lately, alleviated by lidoderm patches, has had ESI's for this which has helped his back pain in the remote past; he denies any abdominal pain, he denies any new back pain.  The patient denies claudication in legs with walking, denies non-healing wounds.  The patient reports history of stroke or TIA symptoms, states he had evaluation by a neurologist/psychiatrist for a possible series of TIA's, pt states she saw a spot on his brain that may have been an old small stroke.  Patient states Dr. Jacinto HalimGanji has been checking his carotid arteries by US.  He takes a daily 325 mg ASA and statin, no other blood thinners. In October 2015 he tried to stop smoking "cold [place]", could not; states his goal is now March 2016 to stop.   Pt Diabetic: Yes, on metformin  Pt smoker: smoker (4-5 cigarettes/day x 49 yrs), decreased from 7-9/day.    Past Medical History  Diagnosis Date  . Hypertension   . Arthritis   . Coronary artery disease   . AAA (abdominal aortic aneurysm)   . Myocardial infarction   . High cholesterol   . Migraine   . Depression   . Anxiety   . Essential tremor 08/18/2012  . Mild cognitive impairment with memory loss 08/18/2012   Past Surgical History  Procedure Laterality Date  . Tonsillectomy    . Appendectomy     Social History History   Social History  . Marital Status: Divorced    Spouse Name: N/A    Number of Children: 1  . Years of Education: BS   Occupational History  . retired    Social History Main Topics  . Smoking status: Light Tobacco Smoker  . Smokeless tobacco: Never Used  . Alcohol Use: 0.0 oz/week    0 Not specified per week   Comment: Seldom  . Drug Use: No  . Sexual Activity: Not on file   Other Topics Concern  . Not on file   Social History Narrative   Patient is divorced.   Patient has one child.   Patient is retired.   Patient has a BS degree in LobbyistComputer Science.   Patient is right-handed.   Patient drinks soda, coffee and tea- about 3-4 cups daily.         Family History Family History  Problem Relation Age of Onset  . Cancer Paternal Grandmother     Current Outpatient Prescriptions on File Prior to Visit  Medication Sig Dispense Refill  . b complex vitamins tablet Take 1 tablet by mouth daily. 90 tablet 3  . Cyanocobalamin (VITAMIN B 12 PO) Take 1,000 mcg by mouth daily.    Elwin Sleight. DULERA 200-5 MCG/ACT AERO Twice daily.    . fexofenadine (ALLEGRA) 180 MG tablet Take 180 mg by mouth daily.    . fish oil-omega-3 fatty acids 1000 MG capsule Take 1 g by mouth 2 (two) times daily.    . furosemide (LASIX) 40 MG tablet Take 40 mg by mouth daily.    Marland Kitchen. galantamine (RAZADYNE ER) 16 MG 24 hr capsule Take 1 capsule (16 mg total) by mouth daily with breakfast. 90 capsule 3  . galantamine (RAZADYNE ER)  16 MG 24 hr capsule TAKE 1 CAPSULE BY MOUTH EVERY DAY 90 capsule 3  . ibuprofen (ADVIL,MOTRIN) 200 MG tablet Take 600 mg by mouth every 6 (six) hours as needed. For pain    . isosorbide mononitrate (IMDUR) 30 MG 24 hr tablet Take 30 mg by mouth daily.    . memantine (NAMENDA) 10 MG tablet Take 1 tablet (10 mg total) by mouth 2 (two) times daily. 180 tablet 3  . metoprolol succinate (TOPROL-XL) 25 MG 24 hr tablet Take 12.5 mg by mouth daily.    Marland Kitchen NASONEX 50 MCG/ACT nasal spray daily.    . nitroGLYCERIN (NITROLINGUAL) 0.4 MG/SPRAY spray Place 1 spray under the tongue every 5 (five) minutes as needed. For chest pain    . pravastatin (PRAVACHOL) 80 MG tablet Take 80 mg by mouth at bedtime.    . ramipril (ALTACE) 10 MG capsule Take 10 mg by mouth daily.    Marland Kitchen venlafaxine XR (EFFEXOR-XR) 75 MG 24 hr capsule     .  vitamin C (ASCORBIC ACID) 500 MG tablet Take 500 mg by mouth 2 (two) times daily.    . vitamin E 400 UNIT capsule Take 200 Units by mouth 2 (two) times daily.     No current facility-administered medications on file prior to visit.   Allergies  Allergen Reactions  . Amoxicillin Swelling  . Exelon [Rivastigmine Tartrate]     Patch  . Penicillins Swelling    ROS: See HPI for pertinent positives and negatives.  Physical Examination  Filed Vitals:   01/10/14 0914  BP: 142/74  Pulse: 51  Resp: 16  Height: 6' (1.829 m)  Weight: 205 lb (92.987 kg)   Body mass index is 27.8 kg/(m^2).  General: A&O x 3, WD.  Pulmonary: Sym exp, good air movt, CTAB, no rales, rhonchi, or wheezing.  Cardiac: RRR, Nl S1, S2, no murmur detected.  Carotid Bruits  Left  Right    Negative  Negative   Aorta is not palpable.  Radial pulses are 2+ and =   VASCULAR EXAM:  LE Pulses  LEFT  RIGHT   FEMORAL  not palpable not palpable  POPLITEAL  not palpable  not palpable   POSTERIOR TIBIAL  2+palpable  2+palpable   DORSALIS PEDIS  ANTERIOR TIBIAL  2+palpable  2+palpable    Gastrointestinal: soft, NTND, -G/R, - HSM, - palpable masses, - CVAT B.  Musculoskeletal: M/S 5/5 throughout, Extremities without ischemic changes.  Neurologic: CN 2-12 intact except speech is halting, Pain and light touch intact in extremities, Motor exam as listed above.    Non-Invasive Vascular Imaging  AAA Duplex (01/10/2014) ABDOMINAL AORTA DUPLEX EVALUATION    INDICATION: Evaluation of known abdominal aortic aneurysm    PREVIOUS INTERVENTION(S):     DUPLEX EXAM:     LOCATION DIAMETER AP (cm) DIAMETER TRANSVERSE (cm) VELOCITIES (cm/sec)  Aorta Proximal 3.52 3.59 74  Aorta Mid 4.06 3.66 37  Aorta Distal 4.8 4.4 29  Right Common Iliac Artery 1.02 1.21   Left Common Iliac Artery 1.02 1.25     Previous max aortic diameter:  4.8cm x cm 4.6 cm transverse Date: 07/05/2013     ADDITIONAL  FINDINGS: Difficult exam due to patient body habitus and overlying bowel gas.    IMPRESSION: Abdominal aortic aneurysm measuring 4.8 cm x 4.4 cm transverse.      Compared to the previous exam:  No significant changes noted when compared to previous exam of 07/05/2013.     Medical Decision Making  The patient is a [age] y.o. male who presents with asymptomatic AAA with no increase in size.  The patient was counseled re smoking cessation and given several free resources re smoking cessation.    Based on this patient's exam and diagnostic studies, the patient will follow up in 6 months  with the following studies: AAA Duplex.  Consideration for repair of AAA would be made when the size is 5.5 cm, growth > 1 cm/yr, and symptomatic status.  I emphasized the importance of maximal medical management including strict control of blood pressure, blood glucose, and lipid levels, antiplatelet agents, obtaining regular exercise, and cessation of smoking.   The patient was given information about AAA including signs, symptoms, treatment, and how to minimize the risk of enlargement and rupture of aneurysms.    The patient was advised to call [phone] should the patient experience sudden onset abdominal or back pain.   Thank you for allowing us to participate in this patient's care.  Charisse MarchSuzanne Vedanth Sirico, RN, MSN, FNP-C [facility] [place] Office: [phone]  Clinic Physician: Darrick PennaFields  01/10/2014, 9:20 AM

## 2014-01-25 DIAGNOSIS — D649 Anemia, unspecified: Secondary | ICD-10-CM

## 2014-01-25 HISTORY — DX: Anemia, unspecified: D64.9

## 2014-02-19 ENCOUNTER — Ambulatory Visit: Payer: Medicare Other | Admitting: Neurology

## 2014-03-04 ENCOUNTER — Encounter: Payer: Self-pay | Admitting: Gastroenterology

## 2014-03-18 ENCOUNTER — Ambulatory Visit (INDEPENDENT_AMBULATORY_CARE_PROVIDER_SITE_OTHER): Payer: Medicare Other | Admitting: Neurology

## 2014-03-18 ENCOUNTER — Encounter: Payer: Self-pay | Admitting: Neurology

## 2014-03-18 VITALS — BP 132/76 | HR 57 | Resp 20 | Ht 70.0 in | Wt 206.0 lb

## 2014-03-18 DIAGNOSIS — Z5181 Encounter for therapeutic drug level monitoring: Secondary | ICD-10-CM | POA: Diagnosis not present

## 2014-03-18 DIAGNOSIS — F04 Amnestic disorder due to known physiological condition: Secondary | ICD-10-CM | POA: Diagnosis not present

## 2014-03-18 MED ORDER — FEXOFENADINE HCL 180 MG PO TABS: 180.0000 mg | ORAL_TABLET | Freq: Every day | ORAL | Status: AC

## 2014-03-18 MED ORDER — GALANTAMINE HYDROBROMIDE ER 16 MG PO CP24: 16.0000 mg | ORAL_CAPSULE | Freq: Every day | ORAL | Status: AC
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
## 2014-03-18 NOTE — Patient Instructions (Signed)

## 2014-03-19 LAB — CBC WITH DIFFERENTIAL/PLATELET
BASOS ABS: 0 10*3/uL (ref 0.0–0.2)
BASOS: 1 %
EOS ABS: 0.3 10*3/uL (ref 0.0–0.4)
Eos: 4 %
HCT: 37.5 % (ref 37.5–51.0)
HEMOGLOBIN: 13 g/dL (ref 12.6–17.7)
IMMATURE GRANS (ABS): 0 10*3/uL (ref 0.0–0.1)
Immature Granulocytes: 0 %
Lymphocytes Absolute: 1.9 10*3/uL (ref 0.7–3.1)
Lymphs: 28 %
MCH: 33 pg (ref 26.6–33.0)
MCHC: 34.7 g/dL (ref 31.5–35.7)
MCV: 95 fL (ref 79–97)
MONOS ABS: 0.6 10*3/uL (ref 0.1–0.9)
Monocytes: 9 %
NEUTROS PCT: 58 %
Neutrophils Absolute: 4 10*3/uL (ref 1.4–7.0)
Platelets: 266 10*3/uL (ref 150–379)
RBC: 3.94 x10E6/uL — ABNORMAL LOW (ref 4.14–5.80)
RDW: 14.3 % (ref 12.3–15.4)
WBC: 6.9 10*3/uL (ref 3.4–10.8)

## 2014-03-19 LAB — COMPREHENSIVE METABOLIC PANEL
A/G RATIO: 1.9 (ref 1.1–2.5)
ALT: 16 IU/L (ref 0–44)
AST: 16 IU/L (ref 0–40)
Albumin: 4.1 g/dL (ref 3.6–4.8)
Alkaline Phosphatase: 47 IU/L (ref 39–117)
BUN / CREAT RATIO: 14 (ref 10–22)
BUN: 16 mg/dL (ref 8–27)
Bilirubin Total: <0.2 mg/dL (ref 0.0–1.2)
CALCIUM: 8.7 mg/dL (ref 8.6–10.2)
CO2: 25 mmol/L (ref 18–29)
CREATININE: 1.15 mg/dL (ref 0.76–1.27)
Chloride: 102 mmol/L (ref 97–108)
GFR, EST AFRICAN AMERICAN: 75 mL/min/{1.73_m2} (ref >59)
GFR, EST NON AFRICAN AMERICAN: 65 mL/min/{1.73_m2} (ref >59)
GLOBULIN, TOTAL: 2.2 g/dL (ref 1.5–4.5)
Glucose: 93 mg/dL (ref 65–99)
POTASSIUM: 3.9 mmol/L (ref 3.5–5.2)
SODIUM: 143 mmol/L (ref 134–144)
Total Protein: 6.3 g/dL (ref 6.0–8.5)

## 2014-03-21 ENCOUNTER — Ambulatory Visit (INDEPENDENT_AMBULATORY_CARE_PROVIDER_SITE_OTHER): Payer: Medicare Other

## 2014-03-21 DIAGNOSIS — F04 Amnestic disorder due to known physiological condition: Secondary | ICD-10-CM | POA: Diagnosis not present

## 2014-03-21 DIAGNOSIS — Z5181 Encounter for therapeutic drug level monitoring: Secondary | ICD-10-CM

## 2014-03-21 NOTE — Progress Notes (Signed)
Quick Note:  Called and left message for patient that his CMET and cbc had improved red blood cell count still low but trying to come up. ______

## 2014-03-28 ENCOUNTER — Ambulatory Visit
Admission: RE | Admit: 2014-03-28 | Discharge: 2014-03-28 | Disposition: A | Discharge status: Home / Self Care | Payer: Medicare Other | Source: Ambulatory Visit | Attending: Neurology | Admitting: Neurology

## 2014-03-28 DIAGNOSIS — F04 Amnestic disorder due to known physiological condition: Secondary | ICD-10-CM

## 2014-03-28 DIAGNOSIS — Z5181 Encounter for therapeutic drug level monitoring: Secondary | ICD-10-CM

## 2014-03-28 MED ORDER — GADOBENATE DIMEGLUMINE 529 MG/ML IV SOLN
19.0000 mL | Freq: Once | INTRAVENOUS | Status: AC | PRN
  Administered 2014-03-28: 19 mL via INTRAVENOUS

## 2014-04-05 ENCOUNTER — Telehealth: Payer: Self-pay | Admitting: *Deleted

## 2014-04-05 NOTE — Telephone Encounter (Signed)
LMVM for pt to return call re: MRI results.

## 2014-04-05 NOTE — Procedures (Signed)
[facility]  EEG (ELECTROENCEPHALOGRAM) REPORT   STUDY DATE:  03-21-14   PATIENT NAME:  [name], [name] ; DOB 07/30/1945  MRN:    ORDERING CLINICIAN: Melvyn Novasarmen Inessa Wardrop, MD   TECHNOLOGIST: Yetta BarreJones  TECHNIQUE: Electroencephalogram was recorded utilizing standard 10-20 system of lead placement and reformatted into average and bipolar montages.   RECORDING TIME:  30 minutes  ACTIVATION:  strobe lights and hyperventilation    CLINICAL INFORMATION:  Circumference suffers from mild cognitive deficit has a past medical history of alcohol abuse and carried the now reversed diagnosis of Alzheimer's type dementia. Since his cognitive deficits have not progressed over the last 10 years , this is likely  an induced cognitive deficit and not Alzheimer's type  dementia.     FINDINGS: A 9 Hz posterior dominant rhythm was noted. The overall EEG amplitude was very low and beta fast activity is noted - not just in the frontal- polar regions.  The cardiac rhythm remains in normal sinus rhythm around 60 bpm. The patient was not able to go to sleep during this EEG.    Conclusion:  normal EEG for the patient's age and conscious state, alert and drowsy. No epileptiform activity is noted         Melvyn Novasarmen Shiva Sahagian , MD

## 2014-04-09 ENCOUNTER — Other Ambulatory Visit: Payer: Self-pay | Admitting: Neurology

## 2014-04-09 NOTE — Progress Notes (Signed)
Quick Note:  I called pt and relayed the results of MRI. No change from 2 yrs ago. Memory loss from atrophy, but not change. Released to his mychart. ______

## 2014-04-23 NOTE — Telephone Encounter (Signed)
I relayed MRI results, see result note.

## 2014-04-29 ENCOUNTER — Telehealth: Payer: Self-pay

## 2014-04-29 ENCOUNTER — Ambulatory Visit: Payer: Medicare Other | Admitting: Gastroenterology

## 2014-04-29 NOTE — Telephone Encounter (Signed)
Do you want to charge? 

## 2014-04-29 NOTE — Telephone Encounter (Signed)
-----   Message from Holli Humbleshristy D Harris sent at 04/29/2014  8:12 AM EDT ----- Pt canceled for today bc he has been up all night vomiting and running a temp

## 2014-04-29 NOTE — Telephone Encounter (Signed)
No charge this time. 

## 2014-07-22 ENCOUNTER — Other Ambulatory Visit: Payer: Self-pay

## 2014-07-24 ENCOUNTER — Encounter: Payer: Self-pay | Admitting: Family

## 2014-07-25 ENCOUNTER — Ambulatory Visit (HOSPITAL_COMMUNITY)
Admission: RE | Admit: 2014-07-25 | Discharge: 2014-07-25 | Disposition: A | Discharge status: Home / Self Care | Payer: Medicare Other | Source: Ambulatory Visit | Attending: Family | Admitting: Family

## 2014-07-25 ENCOUNTER — Ambulatory Visit (INDEPENDENT_AMBULATORY_CARE_PROVIDER_SITE_OTHER): Payer: Medicare Other | Admitting: Family

## 2014-07-25 ENCOUNTER — Encounter: Payer: Self-pay | Admitting: Family

## 2014-07-25 VITALS — BP 150/86 | HR 46 | Resp 14 | Ht 70.5 in | Wt 204.0 lb

## 2014-07-25 DIAGNOSIS — Z87891 Personal history of nicotine dependence: Secondary | ICD-10-CM | POA: Diagnosis not present

## 2014-07-25 DIAGNOSIS — I714 Abdominal aortic aneurysm, without rupture, unspecified: Secondary | ICD-10-CM

## 2014-07-25 NOTE — Addendum Note (Signed)
Addended by: Adria DillELDRIDGE-LEWIS, Airen Stiehl L on: 07/25/2014 04:35 PM   Modules accepted: Orders

## 2014-07-25 NOTE — Patient Instructions (Signed)
Abdominal Aortic Aneurysm An aneurysm is a weakened or damaged part of an artery wall that bulges from the normal force of blood pumping through the body. An abdominal aortic aneurysm is an aneurysm that occurs in the lower part of the aorta, the main artery of the body.  The major concern with an abdominal aortic aneurysm is that it can enlarge and burst (rupture) or blood can flow between the layers of the wall of the aorta through a tear (aorticdissection). Both of these conditions can cause bleeding inside the body and can be life threatening unless diagnosed and treated promptly. CAUSES  The exact cause of an abdominal aortic aneurysm is unknown. Some contributing factors are:   A hardening of the arteries caused by the buildup of fat and other substances in the lining of a blood vessel (arteriosclerosis).  Inflammation of the walls of an artery (arteritis).   Connective tissue diseases, such as Marfan syndrome.   Abdominal trauma.   An infection, such as syphilis or staphylococcus, in the wall of the aorta (infectious aortitis) caused by bacteria. RISK FACTORS  Risk factors that contribute to an abdominal aortic aneurysm may include:  Age older than 60 years.   High blood pressure (hypertension).  Male gender.  Ethnicity (white race).  Obesity.  Family history of aneurysm (first degree relatives only).  Tobacco use. PREVENTION  The following healthy lifestyle habits may help decrease your risk of abdominal aortic aneurysm:  Quitting smoking. Smoking can raise your blood pressure and cause arteriosclerosis.  Limiting or avoiding alcohol.  Keeping your blood pressure, blood sugar level, and cholesterol levels within normal limits.  Decreasing your salt intake. In somepeople, too much salt can raise blood pressure and increase your risk of abdominal aortic aneurysm.  Eating a diet low in saturated fats and cholesterol.  Increasing your fiber intake by including  whole grains, vegetables, and fruits in your diet. Eating these foods may help lower blood pressure.  Maintaining a healthy weight.  Staying physically active and exercising regularly. SYMPTOMS  The symptoms of abdominal aortic aneurysm may vary depending on the size and rate of growth of the aneurysm.Most grow slowly and do not have any symptoms. When symptoms do occur, they may include:  Pain (abdomen, side, lower back, or groin). The pain may vary in intensity. A sudden onset of severe pain may indicate that the aneurysm has ruptured.  Feeling full after eating only small amounts of food.  Nausea or vomiting or both.  Feeling a pulsating lump in the abdomen.  Feeling faint or passing out. DIAGNOSIS  Since most unruptured abdominal aortic aneurysms have no symptoms, they are often discovered during diagnostic exams for other conditions. An aneurysm may be found during the following procedures:  Ultrasonography (A one-time screening for abdominal aortic aneurysm by ultrasonography is also recommended for all men aged 65-75 years who have ever smoked).  X-ray exams.  A computed tomography (CT).  Magnetic resonance imaging (MRI).  Angiography or arteriography. TREATMENT  Treatment of an abdominal aortic aneurysm depends on the size of your aneurysm, your age, and risk factors for rupture. Medication to control blood pressure and pain may be used to manage aneurysms smaller than 6 cm. Regular monitoring for enlargement may be recommended by your caregiver if:  The aneurysm is 3-4 cm in size (an annual ultrasonography may be recommended).  The aneurysm is 4-4.5 cm in size (an ultrasonography every 6 months may be recommended).  The aneurysm is larger than 4.5 cm in   size (your caregiver may ask that you be examined by a vascular surgeon). If your aneurysm is larger than 6 cm, surgical repair may be recommended. There are two main methods for repair of an aneurysm:   Endovascular  repair (a minimally invasive surgery). This is done most often.  Open repair. This method is used if an endovascular repair is not possible. Document Released: 10/21/2004 Document Revised: 05/08/2012 Document Reviewed: 02/11/2012 Exit[employer] Patient Information 2015 [employer]. This information is not intended to replace advice given to you by your health care provider. Make sure you discuss any questions you have with your health care provider.  

## 2014-07-25 NOTE — Progress Notes (Signed)
[facility] [place]  Established Abdominal Aortic Aneurysm  History of Present Illness  [name] is a 69 y.o. (11/26/1945) male patient that Dr. Darrick PennaFields has been following for a AAA. He returns today for follow up.  Patient states he has chronic lumbar spine issues, has had ESI's for this which has helped his back pain in the remote past; he denies any abdominal pain, he denies any new back pain.  He now states his back pain was related to his severe right knee OA pain; now that he is wearing a knee brace, he has no more back pain. He has noted some right ankle swelling since he has been wearing the right knee brace.  The patient denies claudication in legs with walking, denies non-healing wounds.   The patient reports history of stroke or TIA symptoms, states he had evaluation by a neurologist/psychiatrist, Dr. Johnnette Gourdarmen Dohmeir, for a possible series of TIA's, pt states she saw a spot on his brain that may have been an old small stroke.  Patient states Dr. Jacinto HalimGanji has been checking his carotid arteries by US.  He takes a daily 325 mg ASA and statin, no other blood thinners. In October 2015 he tried to stop smoking "cold [place]", could not; states his goal then became March 2016 to stop.   Pt Diabetic: Yes, on metformin  Pt smoker: former smoker (quit again 07/09/14, smoked for 50 yrs)  Past Medical History  Diagnosis Date  . Hypertension   . Arthritis   . Coronary artery disease   . AAA (abdominal aortic aneurysm)   . Myocardial infarction   . High cholesterol   . Migraine   . Depression   . Anxiety   . Essential tremor 08/18/2012  . Mild cognitive impairment with memory loss 08/18/2012  . Pneumonia and influenza   . CAD (coronary artery disease)   . Dementia   . Asthma   . Vitamin D deficiency   . H. pylori infection    Past Surgical History  Procedure Laterality Date  . Tonsillectomy  1964  . Appendectomy  1962  . Splenectomy  1952  . Coronary  angioplasty with stent placement     Social History History   Social History  . Marital Status: Divorced    Spouse Name: N/A  . Number of Children: 1  . Years of Education: BS   Occupational History  . retired    Social History Main Topics  . Smoking status: Light Tobacco Smoker  . Smokeless tobacco: Never Used  . Alcohol Use: 0.0 oz/week    0 Standard drinks or equivalent per week     Comment: Seldom  . Drug Use: No  . Sexual Activity: Not on file   Other Topics Concern  . Not on file   Social History Narrative   Patient is divorced.   Patient has one child.   Patient is retired.   Patient has a BS degree in LobbyistComputer Science.   Patient is right-handed.   Patient drinks soda, coffee and tea- about 3-4 cups daily.         Family History Family History  Problem Relation Age of Onset  . Cancer Paternal Grandmother   . Cirrhosis Mother     Current Outpatient Prescriptions on File Prior to Visit  Medication Sig Dispense Refill  . b complex vitamins tablet Take 1 tablet by mouth daily. 90 tablet 3  . cholecalciferol (VITAMIN D) 1000 UNITS tablet Take 5,000 Units by  mouth daily.    . Cyanocobalamin (VITAMIN B 12 PO) Take 1,000 mcg by mouth daily.    Elwin Sleight 200-5 MCG/ACT AERO Twice daily.    . fexofenadine (ALLEGRA) 180 MG tablet Take 1 tablet (180 mg total) by mouth daily. 90 tablet 3  . fish oil-omega-3 fatty acids 1000 MG capsule Take 1 g by mouth 2 (two) times daily.    . fluticasone (FLONASE) 50 MCG/ACT nasal spray Place 2 sprays into both nostrils daily.    . furosemide (LASIX) 40 MG tablet Take 40 mg by mouth daily.    Marland Kitchen galantamine (RAZADYNE ER) 16 MG 24 hr capsule Take 1 capsule (16 mg total) by mouth daily with breakfast. 90 capsule 3  . galantamine (RAZADYNE ER) 16 MG 24 hr capsule TAKE ONE CAPSULE BY MOUTH ONCE DAILY (Patient not taking: Reported on 07/25/2014) 90 capsule 3  . ibuprofen (ADVIL,MOTRIN) 200 MG tablet Take 600 mg by mouth every 6 (six) hours as  needed. For pain    . isosorbide mononitrate (IMDUR) 30 MG 24 hr tablet Take 30 mg by mouth daily.    . metoprolol succinate (TOPROL-XL) 25 MG 24 hr tablet Take 12.5 mg by mouth daily.    Marland Kitchen NAMENDA 10 MG tablet TAKE 1 TABLET BY MOUTH TWICE DAILY 180 tablet 3  . nitroGLYCERIN (NITROLINGUAL) 0.4 MG/SPRAY spray Place 1 spray under the tongue every 5 (five) minutes as needed. For chest pain    . ramipril (ALTACE) 10 MG capsule Take 10 mg by mouth daily.    . simvastatin (ZOCOR) 40 MG tablet Take 40 mg by mouth daily.    Marland Kitchen venlafaxine XR (EFFEXOR-XR) 75 MG 24 hr capsule     . vitamin C (ASCORBIC ACID) 500 MG tablet Take 500 mg by mouth 2 (two) times daily.    . Vitamin D, Ergocalciferol, (DRISDOL) 50000 UNITS CAPS capsule Take 50,000 Units by mouth every 7 (seven) days.    . vitamin E 400 UNIT capsule Take 200 Units by mouth 2 (two) times daily.     No current facility-administered medications on file prior to visit.   Allergies  Allergen Reactions  . Amoxicillin Swelling  . Exelon [Rivastigmine Tartrate] Rash    Patch  . Penicillins Swelling    ROS: See HPI for pertinent positives and negatives.  Physical Examination  Filed Vitals:   07/25/14 0910  BP: 150/86  Pulse: 46  Resp: 14  Height: 5' 10.5" (1.791 m)  Weight: 204 lb (92.534 kg)  SpO2: 98%   Body mass index is 28.85 kg/(m^2).  General: A&O x 3, WD.  Pulmonary: Sym exp, good air movt, CTAB, no rales, rhonchi, or wheezing.  Cardiac: RRR, Nl S1, S2, no murmur detected.   Carotid Bruits  Left  Right    Negative  Negative   Aorta is not palpable.  Radial pulses are 2+ and =   VASCULAR EXAM:  LE Pulses  LEFT  RIGHT   FEMORAL 2+ palpable 2+ palpable  POPLITEAL  not palpable  not palpable   POSTERIOR TIBIAL  2+palpable  2+palpable   DORSALIS PEDIS  ANTERIOR TIBIAL  2+palpable  2+palpable    Gastrointestinal: soft, NTND, -G/R, - HSM, - palpable masses, - CVAT B.   Musculoskeletal: M/S 5/5 throughout, Extremities without ischemic changes. 2+ pitting edema right ankle. Right knee brace in place. Neurologic: CN 2-12 intact except speech is slightly halting, Pain and light touch intact in extremities, Motor exam as listed above.  Non-Invasive Vascular Imaging  AAA Duplex (07/25/2014) ABDOMINAL AORTA DUPLEX EVALUATION    INDICATION: Evaluation of known abdominal aortic aneurysm    PREVIOUS INTERVENTION(S): None    DUPLEX EXAM:     LOCATION DIAMETER AP (cm) DIAMETER TRANSVERSE (cm) VELOCITIES (cm/sec)  Aorta Proximal 3.0 2.9 29  Aorta Mid 4.1 4.1 106  Aorta Distal 4.8 4.6 34  Right Common Iliac Artery 1.3 1.2 133  Left Common Iliac Artery 1.3 .99 180    Previous max aortic diameter:  4.8 x 4.4 Date: 01/10/2014  ADDITIONAL FINDINGS:     IMPRESSION: Abdominal aortic aneurysm measuring 4.8 x 4.6 cms.      Compared to the previous exam:  No significant change since exam of 01/10/2014     Medical Decision Making  The patient is a 69 y.o. male who presents with asymptomatic AAA with no increase in size. He was congratulated on his smoking cessation and encouraged to remain smoke free; states he is breathing better.   Based on this patient's exam and diagnostic studies, the patient will follow up in 6 months with the following studies: AAA Duplex.  Consideration for repair of AAA would be made when the size is 5.5 cm, growth > 1 cm/yr, and symptomatic status.  I emphasized the importance of maximal medical management including strict control of blood pressure, blood glucose, and lipid levels, antiplatelet agents, obtaining regular exercise, and continued cessation of smoking.   The patient was given information about AAA including signs, symptoms, treatment, and how to minimize the risk of enlargement and rupture of aneurysms.    The patient was advised to call 911 should the patient experience sudden onset abdominal or back  pain.   Thank you for allowing [place] to participate in this patient's care.  Charisse March, RN, MSN, FNP-C [facility] [place] Office: [phone]  Clinic Physician: Darrick Penna  07/25/2014, 9:20 AM

## 2014-11-22 ENCOUNTER — Other Ambulatory Visit: Payer: Self-pay | Admitting: Orthopaedic Surgery

## 2014-12-13 ENCOUNTER — Encounter (HOSPITAL_COMMUNITY): Payer: Self-pay

## 2014-12-13 ENCOUNTER — Encounter (HOSPITAL_COMMUNITY)
Admission: RE | Admit: 2014-12-13 | Discharge: 2014-12-13 | Disposition: A | Discharge status: Home / Self Care | Payer: Medicare Other | Source: Ambulatory Visit | Attending: Orthopaedic Surgery | Admitting: Orthopaedic Surgery

## 2014-12-13 DIAGNOSIS — R079 Chest pain, unspecified: Secondary | ICD-10-CM | POA: Insufficient documentation

## 2014-12-13 DIAGNOSIS — Z01818 Encounter for other preprocedural examination: Secondary | ICD-10-CM | POA: Diagnosis not present

## 2014-12-13 DIAGNOSIS — Z01812 Encounter for preprocedural laboratory examination: Secondary | ICD-10-CM | POA: Diagnosis present

## 2014-12-13 DIAGNOSIS — Z01810 Encounter for preprocedural cardiovascular examination: Secondary | ICD-10-CM | POA: Insufficient documentation

## 2014-12-13 DIAGNOSIS — R0602 Shortness of breath: Secondary | ICD-10-CM | POA: Diagnosis not present

## 2014-12-13 LAB — URINE MICROSCOPIC-ADD ON

## 2014-12-13 LAB — URINALYSIS, ROUTINE W REFLEX MICROSCOPIC
Bilirubin Urine: NEGATIVE
GLUCOSE, UA: NEGATIVE mg/dL
KETONES UR: NEGATIVE mg/dL
LEUKOCYTES UA: NEGATIVE
Nitrite: NEGATIVE
PH: 5.5 (ref 5.0–8.0)
PROTEIN: NEGATIVE mg/dL
Specific Gravity, Urine: 1.008 (ref 1.005–1.030)

## 2014-12-13 LAB — SURGICAL PCR SCREEN
MRSA, PCR: NEGATIVE
Staphylococcus aureus: NEGATIVE

## 2014-12-13 LAB — PROTIME-INR
INR: 1.01 (ref 0.00–1.49)
Prothrombin Time: 13.5 seconds (ref 11.6–15.2)

## 2014-12-13 LAB — BASIC METABOLIC PANEL
Anion gap: 8 (ref 5–15)
BUN: 15 mg/dL (ref 6–20)
CALCIUM: 9.4 mg/dL (ref 8.9–10.3)
CHLORIDE: 108 mmol/L (ref 101–111)
CO2: 27 mmol/L (ref 22–32)
Creatinine, Ser: 1.27 mg/dL — ABNORMAL HIGH (ref 0.61–1.24)
GFR, EST NON AFRICAN AMERICAN: 56 mL/min — AB (ref >60)
Glucose, Bld: 96 mg/dL (ref 65–99)
Potassium: 4 mmol/L (ref 3.5–5.1)
Sodium: 143 mmol/L (ref 135–145)

## 2014-12-13 LAB — CBC WITH DIFFERENTIAL/PLATELET
BASOS PCT: 1 %
Basophils Absolute: 0.1 10*3/uL (ref 0.0–0.1)
EOS ABS: 0.2 10*3/uL (ref 0.0–0.7)
EOS PCT: 3 %
HEMATOCRIT: 43.4 % (ref 39.0–52.0)
Hemoglobin: 14 g/dL (ref 13.0–17.0)
LYMPHS ABS: 1.9 10*3/uL (ref 0.7–4.0)
Lymphocytes Relative: 30 %
MCH: 32.4 pg (ref 26.0–34.0)
MCHC: 32.3 g/dL (ref 30.0–36.0)
MCV: 100.5 fL — AB (ref 78.0–100.0)
Monocytes Absolute: 0.5 10*3/uL (ref 0.1–1.0)
Monocytes Relative: 7 %
NEUTROS PCT: 59 %
Neutro Abs: 3.8 10*3/uL (ref 1.7–7.7)
Platelets: 214 10*3/uL (ref 150–400)
RBC: 4.32 MIL/uL (ref 4.22–5.81)
RDW: 13.9 % (ref 11.5–15.5)
WBC: 6.4 10*3/uL (ref 4.0–10.5)

## 2014-12-13 LAB — TYPE AND SCREEN
ABO/RH(D): A POS
ANTIBODY SCREEN: NEGATIVE

## 2014-12-13 LAB — GLUCOSE, CAPILLARY: Glucose-Capillary: 109 mg/dL — ABNORMAL HIGH (ref 65–99)

## 2014-12-13 LAB — APTT: APTT: 24 s (ref 24–37)

## 2014-12-13 LAB — ABO/RH: ABO/RH(D): A POS

## 2014-12-13 NOTE — Pre-Procedure Instructions (Signed)
    Jaci CarrelRalph C Pelzel  12/13/2014      [facility] [id number] [place]- [place], [place] - [address] AT [facility] [address] [place] [address] Phone: [phone] Fax: [phone]  [facility] [id number] [place], [place] - [id number] AT [facility] [address] [place] [address] Phone: [phone] Fax: [phone]    Your procedure is scheduled on 12/24/14.  [facility] 530 [facility].M.  Call this number if you have problems the morning of surgery:  [phone]   Remember:  Do not eat food or drink liquids after midnight.  Take these medicines the morning of surgery with A SIP OF WATER --imdur,metoprolol,effexor   Do not wear jewelry, make-up or nail polish.  Do not wear lotions, powders, or perfumes.  You may wear deodorant.  Do not shave 48 hours prior to surgery.  Men may shave face and neck.  Do not bring valuables to the hospital.  [facility] is not responsible for any belongings or valuables.  Contacts, dentures or bridgework may not be worn into surgery.  Leave your suitcase in the car.  After surgery it may be brought to your room.  For patients admitted to the hospital, discharge time will be determined by your treatment team.  Patients discharged the day of surgery will not be allowed to drive home.   Name and phone number of your driver:   Special instructions:    Please read over the following fact sheets that you were given. Pain Booklet, Coughing and Deep Breathing, Blood Transfusion Information, MRSA Information and Surgical Site Infection Prevention

## 2014-12-13 NOTE — Progress Notes (Addendum)
Old records requested from dr Jacinto HalimGanji. Pt stated he was aware of surgery.  Pt stated he was not going to do another stess test, dr Jacinto Halimganji did however suggest one..Marland Kitchen

## 2014-12-13 NOTE — Progress Notes (Signed)
The pt just called me, said he "got to thinking" and rescheduled a stess test with dr Jacinto HalimGanji for Monday.

## 2014-12-16 ENCOUNTER — Encounter (HOSPITAL_COMMUNITY): Payer: Self-pay | Admitting: Emergency Medicine

## 2014-12-16 LAB — HEMOGLOBIN A1C
Hgb A1c MFr Bld: 6.7 % — ABNORMAL HIGH (ref 4.8–5.6)
Mean Plasma Glucose: 146 mg/dL

## 2014-12-16 NOTE — Progress Notes (Signed)
Anesthesia Chart Review:  Pt is 69 year old male scheduled for R total knee arthroplasty on 12/24/2014 with Dr. Jerl Santosalldorf.   Cardiologist is Dr. Jacinto HalimGanji.   PMH includes:  CAD (pt reports stent), MI, AAA, HTn, hyperlipidemia, DM, anemia, COPD, asthma, dementia. Former smoker. BMI 28.   Medications include: dulera, lasix, galantamine, imdur, metformin, metoprolol, namenda, ramipril, simvastatin, zetia.   Preoperative labs reviewed.  Glucose 96, hgbA1c 6.7.   Chest x-ray 12/13/14 reviewed. No acute cardiopulmonary disease.   EKG 12/13/14: Sinus bradycardia (57 bpm). RBBB. LAFB. Bifascicular block. LVH. Cannot rule out Septal infarct, age undetermined. No significant change since last tracing per Dr. Prescott GumBensimhon's interpretation.   AAA duplex 07/25/14: AAA measuring 4.8 x 4.6 cm.  No significant change since 01/10/14.   Pt is scheduled for stress test 12/16/14. Will revisit chart when results available.   Rica Mastngela Reylynn Vanalstine, FNP-BC [facility] Phone: [phone] 12/16/2014 2:13 PM

## 2014-12-18 NOTE — H&P (Signed)
TOTAL KNEE ADMISSION H&P  Patient is being admitted for right total knee arthroplasty.  Subjective:  Chief Complaint:right knee pain.  HPI: [name], 69 y.o. male, has a history of pain and functional disability in the right knee due to arthritis and has failed non-surgical conservative treatments for greater than 12 weeks to includeNSAID's and/or analgesics, corticosteriod injections, viscosupplementation injections, flexibility and strengthening excercises, use of assistive devices, weight reduction as appropriate and activity modification.  Onset of symptoms was gradual, starting 5 years ago with gradually worsening course since that time. The patient noted no past surgery on the right knee(s).  Patient currently rates pain in the right knee(s) at 10 out of 10 with activity. Patient has night pain, worsening of pain with activity and weight bearing, pain that interferes with activities of daily living, crepitus and joint swelling.  Patient has evidence of subchondral cysts, subchondral sclerosis, periarticular osteophytes and joint space narrowing by imaging studies. There is no active infection.  Patient Active Problem List   Diagnosis Date Noted  . Abdominal aneurysm without mention of rupture 12/28/2012  . Essential tremor 08/18/2012  . Mild cognitive impairment with memory loss 08/18/2012  . 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 Hypertension   . Arthritis   . Coronary artery disease   . AAA (abdominal aortic aneurysm) (HCC)   . Myocardial infarction (HCC)   . High cholesterol   . Migraine   . Depression   . Anxiety   . Essential tremor 08/18/2012  . Mild cognitive impairment with memory loss 08/18/2012  . Pneumonia and influenza   . CAD (coronary artery disease)   . Dementia   . Asthma   . Vitamin D deficiency   . H. pylori infection   . COPD (chronic obstructive pulmonary disease) (HCC)   . Anemia 2016  . Diabetes mellitus  without complication (HCC)     Type I Tx Metformin    Past Surgical History  Procedure Laterality Date  . Tonsillectomy  1964  . Appendectomy  1962  . Splenectomy  1952  . Coronary angioplasty with stent placement      No prescriptions prior to admission   Allergies  Allergen Reactions  . Amoxicillin Swelling  . Exelon [Rivastigmine Tartrate] Rash    Patch  . Penicillins Swelling    Social History  Substance Use Topics  . Smoking status: Former Smoker    Quit date: 07/09/2014  . Smokeless tobacco: Never Used  . Alcohol Use: 0.0 oz/week    0 Standard drinks or equivalent per week     Comment: Seldom    Family History  Problem Relation Age of Onset  . Cancer Paternal Grandmother   . Cirrhosis Mother      Review of Systems  Musculoskeletal: Positive for joint pain.       Right knee  All other systems reviewed and are negative.   Objective:  Physical Exam  Constitutional: He is oriented to person, place, and time. He appears well-developed and well-nourished.  HENT:  Head: Normocephalic and atraumatic.  Eyes: Pupils are equal, round, and reactive to light.  Neck: Normal range of motion.  Cardiovascular: Normal rate and regular rhythm.   Respiratory: Effort normal.  GI: Soft.  Musculoskeletal:  Right knee motion is from 5-120. He has pain on the medial joint line and some crepitation. Hip motion is full and straight leg raise is negative. There is no palpable lymphadenopathy behind his  knee. Sensation and motor function are intact in his feet with palpable pulses on both sides.   Neurological: He is alert and oriented to person, place, and time.  Skin: Skin is warm and dry.  Psychiatric: He has a normal mood and affect. His behavior is normal. Judgment and thought content normal.    Vital signs in last 24 hours:    Labs:   Estimated body mass index is 28.85 kg/(m^2) as calculated from the following:   Height as of 07/25/14: 5' 10.5" (1.791 m).   Weight as  of 07/25/14: 92.534 kg (204 lb).   Imaging Review Plain radiographs demonstrate severe degenerative joint disease of the right knee(s). The overall alignment isneutral. The bone quality appears to be good for age and reported activity level.  Assessment/Plan:  End stage primary arthritis, right knee   The patient history, physical examination, clinical judgment of the provider and imaging studies are consistent with end stage degenerative joint disease of the right knee(s) and total knee arthroplasty is deemed medically necessary. The treatment options including medical management, injection therapy arthroscopy and arthroplasty were discussed at length. The risks and benefits of total knee arthroplasty were presented and reviewed. The risks due to aseptic loosening, infection, stiffness, patella tracking problems, thromboembolic complications and other imponderables were discussed. The patient acknowledged the explanation, agreed to proceed with the plan and consent was signed. Patient is being admitted for inpatient treatment for surgery, pain control, PT, OT, prophylactic antibiotics, VTE prophylaxis, progressive ambulation and ADL's and discharge planning. The patient is planning to be discharged to skilled nursing facility

## 2014-12-24 ENCOUNTER — Encounter (HOSPITAL_COMMUNITY): Admission: RE | Payer: Self-pay | Source: Ambulatory Visit

## 2014-12-24 ENCOUNTER — Inpatient Hospital Stay (HOSPITAL_COMMUNITY): Admission: RE | Admit: 2014-12-24 | Payer: Medicare Other | Source: Ambulatory Visit | Admitting: Orthopaedic Surgery

## 2014-12-24 SURGERY — ARTHROPLASTY, KNEE, TOTAL
Anesthesia: Spinal | Laterality: Right

## 2015-01-23 ENCOUNTER — Encounter: Payer: Self-pay | Admitting: Family

## 2015-01-26 HISTORY — PX: CORONARY ANGIOPLASTY WITH STENT PLACEMENT: SHX49

## 2015-01-30 ENCOUNTER — Encounter: Payer: Self-pay | Admitting: Family

## 2015-01-30 ENCOUNTER — Ambulatory Visit (HOSPITAL_COMMUNITY)
Admission: RE | Admit: 2015-01-30 | Discharge: 2015-01-30 | Disposition: A | Discharge status: Home / Self Care | Payer: Medicare Other | Source: Ambulatory Visit | Attending: Family | Admitting: Family

## 2015-01-30 ENCOUNTER — Ambulatory Visit (INDEPENDENT_AMBULATORY_CARE_PROVIDER_SITE_OTHER): Payer: Medicare Other | Admitting: Family

## 2015-01-30 VITALS — BP 165/84 | HR 46 | Temp 97.5°F | Resp 20 | Ht 71.0 in | Wt 208.8 lb

## 2015-01-30 DIAGNOSIS — I714 Abdominal aortic aneurysm, without rupture, unspecified: Secondary | ICD-10-CM

## 2015-01-30 DIAGNOSIS — E119 Type 2 diabetes mellitus without complications: Secondary | ICD-10-CM | POA: Diagnosis not present

## 2015-01-30 DIAGNOSIS — Z87891 Personal history of nicotine dependence: Secondary | ICD-10-CM | POA: Diagnosis not present

## 2015-01-30 DIAGNOSIS — E7800 Pure hypercholesterolemia, unspecified: Secondary | ICD-10-CM | POA: Diagnosis not present

## 2015-01-30 DIAGNOSIS — I10 Essential (primary) hypertension: Secondary | ICD-10-CM | POA: Insufficient documentation

## 2015-01-30 NOTE — Patient Instructions (Addendum)
Abdominal Aortic Aneurysm An aneurysm is a weakened or damaged part of an artery wall that bulges from the normal force of blood pumping through the body. An abdominal aortic aneurysm is an aneurysm that occurs in the lower part of the aorta, the main artery of the body.  The major concern with an abdominal aortic aneurysm is that it can enlarge and burst (rupture) or blood can flow between the layers of the wall of the aorta through a tear (aorticdissection). Both of these conditions can cause bleeding inside the body and can be life threatening unless diagnosed and treated promptly. CAUSES  The exact cause of an abdominal aortic aneurysm is unknown. Some contributing factors are:   A hardening of the arteries caused by the buildup of fat and other substances in the lining of a blood vessel (arteriosclerosis).  Inflammation of the walls of an artery (arteritis).   Connective tissue diseases, such as Marfan syndrome.   Abdominal trauma.   An infection, such as syphilis or staphylococcus, in the wall of the aorta (infectious aortitis) caused by bacteria. RISK FACTORS  Risk factors that contribute to an abdominal aortic aneurysm may include:  Age older than 60 years.   High blood pressure (hypertension).  Male gender.  Ethnicity (white race).  Obesity.  Family history of aneurysm (first degree relatives only).  Tobacco use. PREVENTION  The following healthy lifestyle habits may help decrease your risk of abdominal aortic aneurysm:  Quitting smoking. Smoking can raise your blood pressure and cause arteriosclerosis.  Limiting or avoiding alcohol.  Keeping your blood pressure, blood sugar level, and cholesterol levels within normal limits.  Decreasing your salt intake. In somepeople, too much salt can raise blood pressure and increase your risk of abdominal aortic aneurysm.  Eating a diet low in saturated fats and cholesterol.  Increasing your fiber intake by including  whole grains, vegetables, and fruits in your diet. Eating these foods may help lower blood pressure.  Maintaining a healthy weight.  Staying physically active and exercising regularly. SYMPTOMS  The symptoms of abdominal aortic aneurysm may vary depending on the size and rate of growth of the aneurysm.Most grow slowly and do not have any symptoms. When symptoms do occur, they may include:  Pain (abdomen, side, lower back, or groin). The pain may vary in intensity. A sudden onset of severe pain may indicate that the aneurysm has ruptured.  Feeling full after eating only small amounts of food.  Nausea or vomiting or both.  Feeling a pulsating lump in the abdomen.  Feeling faint or passing out. DIAGNOSIS  Since most unruptured abdominal aortic aneurysms have no symptoms, they are often discovered during diagnostic exams for other conditions. An aneurysm may be found during the following procedures:  Ultrasonography (A one-time screening for abdominal aortic aneurysm by ultrasonography is also recommended for all men aged 65-75 years who have ever smoked).  X-ray exams.  A computed tomography (CT).  Magnetic resonance imaging (MRI).  Angiography or arteriography. TREATMENT  Treatment of an abdominal aortic aneurysm depends on the size of your aneurysm, your age, and risk factors for rupture. Medication to control blood pressure and pain may be used to manage aneurysms smaller than 6 cm. Regular monitoring for enlargement may be recommended by your caregiver if:  The aneurysm is 3-4 cm in size (an annual ultrasonography may be recommended).  The aneurysm is 4-4.5 cm in size (an ultrasonography every 6 months may be recommended).  The aneurysm is larger than 4.5 cm in   size (your caregiver may ask that you be examined by a vascular surgeon). If your aneurysm is larger than 6 cm, surgical repair may be recommended. There are two main methods for repair of an aneurysm:   Endovascular  repair (a minimally invasive surgery). This is done most often.  Open repair. This method is used if an endovascular repair is not possible.   This information is not intended to replace advice given to you by your health care provider. Make sure you discuss any questions you have with your health care provider.   Document Released: 10/21/2004 Document Revised: 05/08/2012 Document Reviewed: 02/11/2012 Elsevier Interactive Patient Education 2016 [employer]      Your blood pressure today was 174/84 and 165/84, heart rate was 47 beats/minute

## 2015-01-30 NOTE — Progress Notes (Signed)
Filed Vitals:   01/30/15 0920 01/30/15 0927  BP: 173/84 165/84  Pulse: 47 46  Temp: 97.5 F (36.4 C)   TempSrc: Oral   Resp: 20   Height: 5\' 11"  (1.803 m)   Weight: 208 lb 12.8 oz (94.711 kg)

## 2015-01-30 NOTE — Progress Notes (Signed)
[facility] [place]  Established Abdominal Aortic Aneurysm  History of Present Illness  [name] is a 70 y.o. ([birth year]) male patient that Dr. Darrick Penna has been following for a AAA. He returns today for follow up.  Patient states he has chronic lumbar spine issues, has had ESI's for this which has helped his back pain in the remote past; he denies any abdominal pain, he denies any new back pain.  He now states his back pain was related to his severe right knee OA pain; now that he is wearing a knee brace, he has no more back pain. He has noted some right ankle swelling since he has been wearing the right knee brace.  The patient denies claudication in legs with walking, denies non-healing wounds.   Pt states he cannot take his blood pressure medications with a sip of water, needs to have food in his stomach, states his blood pressure is high due to not being able to take his morning meds.   States he has dyspnea and higher than usual blood pressure, and is scheduled for a cardiac cath on 02/11/15 by Dr. Nadara Eaton.  The patient reports history of stroke or TIA symptoms, states he had evaluation by a neurologist/psychiatrist, Dr. Johnnette Gourd, for a possible series of TIA's, pt states she saw a spot on his brain that may have been an old small stroke.  Patient states Dr. Jacinto Halim has been checking his carotid arteries by [place].  He takes a daily 325 mg ASA and statin, no other blood thinners.  Pt Diabetic: Yes, on metformin  Pt smoker: former smoker (quit again 07/09/14, smoked for 50 yrs)   Past Medical History  Diagnosis Date  . Hypertension   . Arthritis   . Coronary artery disease   . AAA (abdominal aortic aneurysm) (HCC)   . Myocardial infarction (HCC)   . High cholesterol   . Migraine   . Depression   . Anxiety   . Essential tremor 08/18/2012  . Mild cognitive impairment with memory loss 08/18/2012  . Pneumonia and influenza   . CAD (coronary artery  disease)   . Dementia   . Asthma   . Vitamin D deficiency   . H. pylori infection   . COPD (chronic obstructive pulmonary disease) (HCC)   . Anemia 2016  . Diabetes mellitus without complication (HCC)     Type I Tx Metformin   Past Surgical History  Procedure Laterality Date  . Tonsillectomy  1964  . Appendectomy  1962  . Splenectomy  1952  . Coronary angioplasty with stent placement     Social History Social History   Social History  . Marital Status: Divorced    Spouse Name: N/A  . Number of Children: 1  . Years of Education: BS   Occupational History  . retired    Social History Main Topics  . Smoking status: Former Smoker    Quit date: 07/09/2014  . Smokeless tobacco: Never Used  . Alcohol Use: 0.0 oz/week    0 Standard drinks or equivalent per week     Comment: Seldom  . Drug Use: No  . Sexual Activity: Not on file   Other Topics Concern  . Not on file   Social History Narrative   Patient is divorced.   Patient has one child.   Patient is retired.   Patient has a BS degree in Lobbyist.   Patient is right-handed.   Patient drinks soda, coffee and  tea- about 3-4 cups daily.         Family History Family History  Problem Relation Age of Onset  . Cancer Paternal Grandmother   . Cirrhosis Mother     Current Outpatient Prescriptions on File Prior to Visit  Medication Sig Dispense Refill  . b complex vitamins tablet Take 1 tablet by mouth daily. 90 tablet 3  . cholecalciferol (VITAMIN D) 1000 UNITS tablet Take 5,000 Units by mouth daily.    . Cyanocobalamin (VITAMIN B 12 PO) Take 1,000 mcg by mouth 2 (two) times daily.     . DULERA 200-5 MCG/ACT AERO Inhale 2 puffs into the lungs Twice daily.     . fexofenadine (ALLEGRA) 180 MG tablet Take 1 tablet (180 mg total) by mouth daily. 90 tablet 3  . fish oil-omega-3 fatty acids 1000 MG capsule Take 1 g by mouth 2 (two) times daily.    . fluticasone (FLONASE) 50 MCG/ACT nasal spray Place 2 sprays into  both nostrils daily.    . furosemide (LASIX) 40 MG tablet Take 40 mg by mouth daily.    Marland Kitchen galantamine (RAZADYNE ER) 16 MG 24 hr capsule Take 1 capsule (16 mg total) by mouth daily with breakfast. 90 capsule 3  . galantamine (RAZADYNE ER) 16 MG 24 hr capsule TAKE ONE CAPSULE BY MOUTH ONCE DAILY 90 capsule 3  . ibuprofen (ADVIL,MOTRIN) 200 MG tablet Take 600 mg by mouth every 6 (six) hours as needed for fever, headache or mild pain. For pain    . isosorbide mononitrate (IMDUR) 30 MG 24 hr tablet Take 30 mg by mouth daily.    . metFORMIN (GLUCOPHAGE) 500 MG tablet Take 500 mg by mouth daily.   11  . metoprolol succinate (TOPROL-XL) 25 MG 24 hr tablet Take 12.5 mg by mouth daily.    Marland Kitchen NAMENDA 10 MG tablet TAKE 1 TABLET BY MOUTH TWICE DAILY 180 tablet 3  . nitroGLYCERIN (NITROLINGUAL) 0.4 MG/SPRAY spray Place 1 spray under the tongue every 5 (five) minutes as needed. For chest pain    . ramipril (ALTACE) 10 MG capsule Take 10 mg by mouth daily.    . simvastatin (ZOCOR) 40 MG tablet Take 40 mg by mouth daily.    Marland Kitchen venlafaxine XR (EFFEXOR-XR) 75 MG 24 hr capsule Take 75 mg by mouth 2 (two) times daily.     . VENTOLIN HFA 108 (90 BASE) MCG/ACT inhaler Inhale 2 puffs into the lungs every 6 (six) hours as needed for wheezing or shortness of breath.   12  . vitamin C (ASCORBIC ACID) 500 MG tablet Take 500 mg by mouth 2 (two) times daily.    . Vitamin D, Ergocalciferol, (DRISDOL) 50000 UNITS CAPS capsule Take 50,000 Units by mouth every 7 (seven) days.    . vitamin E 200 UNIT capsule Take 200 Units by mouth 2 (two) times daily.    . vitamin E 400 UNIT capsule Take 200 Units by mouth 2 (two) times daily.    Marland Kitchen ZETIA 10 MG tablet Take 10 mg by mouth daily.   4   No current facility-administered medications on file prior to visit.   Allergies  Allergen Reactions  . Amoxicillin Swelling  . Exelon [Rivastigmine Tartrate] Rash    Patch  . Penicillins Swelling    ROS: See HPI for pertinent positives and  negatives.  Physical Examination  Filed Vitals:   01/30/15 0920 01/30/15 0927  BP: 173/84 165/84  Pulse: 47 46  Temp: 97.5 F (36.4 C)  TempSrc: Oral   Resp: 20   Height: 5\' 11"  (1.803 m)   Weight: 208 lb 12.8 oz (94.711 kg)    Body mass index is 29.13 kg/(m^2).  General: A&O x 3, WD.  Pulmonary: Sym exp, good air movt, CTAB, no rales, rhonchi, or wheezing.  Cardiac: RRR, Nl S1, S2, no murmur detected.   Carotid Bruits  Left  Right    Negative  Negative   Aorta is not palpable.  Radial pulses are 2+ and =   VASCULAR EXAM:  LE Pulses  LEFT  RIGHT   FEMORAL 2+ palpable 2+ palpable  POPLITEAL  not palpable  not palpable   POSTERIOR TIBIAL  2+palpable  2+palpable   DORSALIS PEDIS  ANTERIOR TIBIAL  2+palpable  2+palpable    Gastrointestinal: soft, NTND, -G/R, - HSM, - palpable masses, - CVAT B.  Musculoskeletal: M/S 5/5 throughout, Extremities without ischemic changes. Right knee brace in place. Neurologic: CN 2-12 intact except speech is slightly halting, Pain and light touch intact in extremities, Motor exam as listed above.                Non-Invasive Vascular Imaging  AAA Duplex (01/30/2015)  Previous size: 4.8 cm (Date: 07/25/2014)  Current size:  5.2 cm (Date: 01/30/2015)  Medical Decision Making  The patient is a 70 y.o. male who presents with asymptomatic AAA with increase in size, to 5.2 cm from 4.8 cm in six months. Duplex visualization was limited, however, due to overlying bowel gas and patient body habitus. I discussed pt HPI and AAA duplex results with Dr. Darrick PennaFields.  Pt is scheduled for a cardiac cath on 02/11/15; will wait 2-3 weeks after this (02/25/15-03/04/15) to schedule a CTA of abd/pelvis for further evaluation of AAA, follow up with Dr. Darrick PennaFields.   Consideration for repair of AAA would be made when the size is 5.5 cm, growth > 1 cm/yr, and symptomatic status.  I  emphasized the importance of maximal medical management including strict control of blood pressure, blood glucose, and lipid levels, antiplatelet agents, obtaining regular exercise, and continued  cessation of smoking.   The patient was given information about AAA including signs, symptoms, treatment, and how to minimize the risk of enlargement and rupture of aneurysms.    The patient was advised to call 911 should the patient experience sudden onset abdominal or back pain.   Thank you for allowing us to participate in this patient's care.  Charisse MarchSuzanne Nickel, RN, MSN, FNP-C [facility] [place] Office: [phone]  Clinic Physician: Darrick PennaFields  01/30/2015, 9:38 AM

## 2015-02-09 DIAGNOSIS — I209 Angina pectoris, unspecified: Secondary | ICD-10-CM

## 2015-02-09 DIAGNOSIS — I2510 Atherosclerotic heart disease of native coronary artery without angina pectoris: Secondary | ICD-10-CM

## 2015-02-09 NOTE — H&P (Signed)
OFFICE VISIT NOTES COPIED TO EPIC FOR DOCUMENTATION [name] 01/09/2015 8:26 AM Location: [facility] Patient #: [id number] DOB: 12/14/1945 Divorced / Language: Cleophus Molt / Race: White Male   History of Present Illness Wyatt Page MD; 01/09/2015 1:08 PM) Patient words: Last O/V 12/23/2014; 2 week F/U for CAD; Pt has several questions regarding his recent test results.  The patient is a 70 year old male who presents for a follow-up for Coronary artery disease. Caucasian male with history of known coronary artery disease, history of MI x 2 and PTCA in the past, previously has had multiple stents placed to his circumflex coronary artery. He was scheduled for right knee , underwent stress testing due to his cardiac risk factors, presents here for follow-up. He has postponed his surgery due to social issues.  He is here on office visit and follow-up of recent stress testing and echocardiogram, I had seen him and discussed the results of the stress test previously although abnormal I recommended medical therapy as well as asymptomatic and had advised him to increase his physical activity. Patient now states that after he increased his physical activity he noticed marked dyspnea on exertion. He is very concerned about this, hence made an appointment to see [place] as previously instructed if he were to develop symptoms. He has quit smoking since June 2016. He has not used sublingual nitroglycerin for the past greater than 6 months. 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 He was recommended a carotid duplex at that time. Carotid duplex in December 2016 revealed moderate amount of heterogeneous plaque but no significant stenosis. His diabetes has been well-controlled. He is tolerating all his medications well. No symptoms of claudication.    Problem  List/Past Medical Anderson Malta Sergeant; 01/09/2015 12:00 PM) Atherosclerosis of native coronary artery of native heart with angina pectoris (I25.119) Cardiac cath 06/05/99: H/O Inf-lat infarct 1999, s/p dominant circumflex coronary artery stents. Patent. 20% scattered disease in LAD, Cx. EF 50%: Has had several stents (Circumflex coronary artery) and 3 MI's (first one in 1999). Lexiscan myoview stress test 12/16/2014: 1. Resting EKG demonstrated normal sinus rhythm, left axis deviation, left anterior fascicular block. Right bundle branch block. Stress EKG is nondiagnostic for ischemia as is a pharmacologic stress test. Stress symptoms included shortness of breath 2. Perfusion imaging study demonstrates a moderate-sized inferior, inferolateral scar with very minimal peri-infarct ischemia. In addition there is anterior wall moderate ischemia extending from the base towards the mid ventricle. Left ventricle systolic function calculated by QGS was 24% with global hypokinesis. Compared to the study done on 02/05/2011, anterior wall ischemia is new and EF was previously 33%. This represents at least an intermediate risk scan, clinical correlation is recommended. Echo 08/09/11: Normal LVEF. Mod LVH. Mild diastolic dysfunction. Mild AI. Aortic valve disorder (I35.9) Mixed hyperlipidemia (E78.2) Essential hypertension, benign (I10) Angina pectoris (I20.9) Lexiscan myoview stress test 12/16/2014: 1. Resting EKG demonstrated normal sinus rhythm, left axis deviation, left anterior fascicular block. Right bundle branch block. Stress EKG is nondiagnostic for ischemia as is a pharmacologic stress test. Stress symptoms included shortness of breath 2. Perfusion imaging study demonstrates a moderate-sized inferior, inferolateral scar with very minimal peri-infarct ischemia. In addition there is anterior wall moderate ischemia extending from the base towards the mid ventricle. Left ventricle systolic function calculated by QGS  was 24% with global hypokinesis. Compared to the study  done on 02/05/2011, anterior wall ischemia is new. This represents at least an intermediate risk scan, clinical correlation is recommended. No significant change in the ejection fraction, ischemic 33%. Controlled type 2 diabetes mellitus with stage 3 chronic kidney disease, without long-term current use of insulin (E11.22) Abdominal aneurysm without mention of rupture (441.4) AAA duplex 07/25/2014: Abdominal aortic aneurysm measuring 4.8 x 4.6 cm. No significant change from prior exam. 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 Consider carotid duplex. Resolved the following month. Personal history of tobacco use, presenting hazards to health (Z87.891)07/12/2014 Quit 07/12/2014  Allergies Anderson Malta Sergeant; January 27, 2015 11:59 AM) Fluticasone Propionate (Nasal) *NASAL AGENTS - SYSTEMIC AND TOPICAL* Anaphylaxis, Respiratory distress. Penicillin V Potassium *PENICILLINS* Rash. Exelon *Psychotherapeutic and Neurological Agents - Misc.** Rash. Transdermal film, extended release. Tremors  Family History Anderson Malta Sergeant; 2015-01-27 11:59 AM) Mother Deceased. in her 59's from Cirrhosis of the Liver Father Deceased. at age 37, from being Murdered Brother 1 3 years younger  Social History Lolita Lenz; 2015-01-27 11:59 AM) Current tobacco use Former smoker. Quit 06/2014 Alcohol Use Occasional alcohol use. Marital status Divorced. Number of Children 1. Living Situation Lives alone.  Past Surgical History Anderson Malta Sergeant; 01-27-2015 11:59 AM) Has had several stents (Circumflex coronary artery) and 3 MI's (first one in 1999) Last cardiac cath 06/05/99: H/O Inf-lat infarct 1999, s/p dominant circumflex coronary artery stents. Patent. 20% scattered disease in LAD, Cx. EF 50%:  Tonsillectomy and Appendectomy as a teenager Spleenectomy  1952.  Medication History Anderson Malta Sergeant; 01/27/15 12:08 PM) Furosemide (40MG Tablet, 1 (one) Tablet Tablet Oral daily, Taken starting 11/27/2014) Active. Ramipril (10MG Capsule, 1 (one) Capsule Capsule Capsul Oral daily, Taken starting 07/21/2014) Active. Metoprolol Succinate ER (25MG Tablet ER 24HR, 1/2 Tablet ER 24HR Tablet ER 2 Oral daily, Taken starting 05/29/2014) Active. Nitroglycerin (400MCG/SPRAY Aerosol Soln, 1 (one) Aerosol [employer] S Translingual every 5 minutes as needed for chest pain., Taken starting 01/24/2014) Active. Isosorbide Mononitrate ER (30MG Tablet ER 24HR, 1 (one) Tablet ER 24HR Tablet Oral daily, Taken starting 12/24/2013) Active. Namenda (10MG Tablet, 1 Oral two times daily) Active. Galantamine Hydrobromide ER (16MG Capsule ER 24HR, 1 Oral daily) Active. Allegra (180MG Tablet, 1 Oral daily) Active. Venlafaxine HCl (75MG Tablet, 2 Oral daily) Active. (Dr Maudie Mercury increased to 2 daily 07/2012) Ibuprofen (200MG Capsule, 3 Oral twice daily) Active. MetFORMIN HCl (500MG Tablet, 1 Oral daily) Active. Dulera (200-5MCG/ACT Aerosol, 2 puffs Inhalation twice daily) Active. Vitamin D 50,000  (1 weekly) Active. Vitamin B12 1060mg (1 two times daily) Active. Aspirin (325MG Tablet, 1 Oral daily) Active. Fish Oil 10062m300mg Omega 3 (2 daily) Active. Vitamin C with Rose Hips 50044m2 daily) Active. Vitamin E d-alpha 200 IU (2 daily) Active. Fluticasone Propionate (50MCG/ACT Suspension, 2 sprays ea nostril Nasal daily as needed) Active. Zetia (10MG Tablet, 1 Oral daily) Active. Simvastatin (40MG Tablet, 1 Oral daily) Active. Ventolin HFA (108 (90 Base)MCG/ACT Aerosol Soln, 2 puffs Inhalation as needed) Active. Medications Reconciled (Verbally)  Diagnostic Studies History (JeAnderson Maltargeant; 12/27-Jan-2015:00 PM) Echocardiogram12/06/2014 Left ventricle cavity is normal in size. Basal inferolateral, Mid inferolateral, Mid inferior and Apical  inferior hypokinesis. LVEF mildly depressed and estimated at 45% Moderate concentric hypertrophy of the left ventricle. Doppler evidence of grade Left atrial cavity is mildly dilated. Moderate aortic regurgitation. Mild mitral regurgitation. Carotid Doppler12/06/2014 Minimal stenosis in the right internal carotid artery (minimal). Diffuse mixed plaque bilateral. Antegrade right vertebral artery flow. Antegrade left vertebral artery flow. No significant chagne since  08/12/2011. Carotid duplex 08/12/11: Mild plaque. No stenosis. Stress sestamibi 02/05/11: LV dilated. EF 33%. Lateral and inferolateral scar. No ischemia. Low risk.  ECG 07/27/11: S. Bradycardia @ 44/min. RBBB. No ischemia. Normal QT. No change from 03/25/11. Colonoscopy 2006 (removed benign polyps) Sleep study 2005 (negative for sleep apnea) Nuclear stress test 2007 Echo 08/09/11: Normal LVEF. Mod LVH. Mild diastolic dysfunction. Mild AI. Nuclear stress test11/21/2016 1. Resting EKG demonstrated normal sinus rhythm, left axis deviation, left anterior fascicular block. Right bundle branch block. Stress EKG is nondiagnostic for ischemia as is a pharmacologic stress test. Stress symptoms included shortness of breath 2. Perfusion imaging study demonstrates a moderate-sized inferior, inferolateral scar with very minimal peri-infarct ischemia. In addition there is anterior wall moderate ischemia extending from the base towards the mid ventricle. Left ventricle systolic function calculated by QGS was 24% with global hypokinesis. Compared to the study done on 02/05/2011, anterior wall ischemia is new. This represents at least an intermediate risk scan, clinical correlation is recommended. No significant change in the ejection fraction, ischemic 33%.  Other Problems Anderson Malta Sergeant; 01/09/2015 12:00 PM) Abdominal aortic aneurysm measiring 3.8 cm follows Ruta Hinds, MD [facility] Labs 03/18/2014: Serum glucose 93 mg, BUN 16, serum creatinine  1.1, eGFR 65 mL. Potassium 3.9. CMP otherwise normal. CBC normal, Labs 02/25/2014: CBC normal, BUN 22, creatinine 1.4, CMP otherwise normal. Labs 03/14/2013: BUN 16, serum creatinine 1.2 with eGFR 63 mL. Electrolytes normal, CMP normal. HB 13.4/HCT 40.4. Platelet count normal. MCV elevated at 101. Labs 02/25/2014: HbA1c 6.4%. Labs 03/14/2013: HbA1c 6.5% HbA1c 09/13/2012 6.2%. Labs 02/25/2014: total cholesterol 98, triglycerides 98, HDL 43, LDL 132, Vit D 51.5. Labs 03/14/2013: Total cholesterol 200, triglycerides 76, HDL 53, LDL 132. Non-HDL cholesterol 147.    Review of Systems Wyatt Page, MD; 01/09/2015 5:51 PM) General Present- Feeling well. Not Present- Tiredness and Unable to Sleep Lying Flat. HEENT Not Present- Blurred Vision. Respiratory Not Present- Bloody sputum and Wakes up from Sleep Wheezing or Short of Breath. Cardiovascular Present- Difficulty Breathing On Exertion. Not Present- Edema, Leg Cramps, Palpitations and Paroxysmal Nocturnal Dyspnea. Gastrointestinal Not Present- Black, Tarry Stool, Bloody Stool and Heartburn. Musculoskeletal Present- Joint Pain (severe DJD right knee) and Joint Swelling. Not Present- Claudication. Neurological Not Present- Focal Neurological Symptoms. Psychiatric Not Present- Personality Changes and Suicidal Ideation. Hematology Not Present- Blood Clots, Easy Bruising and Nose Bleed.  Vitals Anderson Malta Sergeant; 01/09/2015 12:12 PM) 01/09/2015 12:00 PM Weight: 214.06 lb Height: 72in Body Surface Area: 2.19 m Body Mass Index: 29.03 kg/m  Pulse: 65 (Regular)  P.OX: 95% (Room air) BP: 140/80 (Sitting, Left Arm, Standard)       Physical Exam Wyatt Page, MD; 01/09/2015 5:51 PM) General Mental Status-Alert. General Appearance-Cooperative, Appears stated age, Not in acute distress. Orientation-Oriented X3. Build & Nutrition-Well built and Well nourished.  Head and Neck Thyroid Gland Characteristics - no palpable  nodules, no palpable enlargement.  Chest and Lung Exam Chest and lung exam reveals -on auscultation, normal breath sounds, no adventitious sounds and normal vocal resonance. Palpation Tender - No chest wall tenderness.  Cardiovascular Cardiovascular examination reveals -carotid auscultation reveals no bruits, abdominal aorta auscultation reveals no bruits, femoral artery auscultation bilaterally reveals normal pulses, no bruits, no thrills, normal pedal pulses bilaterally and no digital clubbing, cyanosis, edema, increased warmth or tenderness. Inspection Jugular vein - Right - No Distention. Auscultation Heart Sounds - S1 WNL, S2 WNL and No gallop present. Murmurs & Other Heart Sounds: Murmur - Location - Aortic Area. Timing -  Early systolic. Grade - II/VI.  Abdomen Palpation/Percussion Normal exam - Non Tender and No hepatosplenomegaly. Auscultation Normal exam - Bowel sounds normal.  Neurologic Motor-Grossly intact without any focal deficits.  Musculoskeletal Global Assessment Left Lower Extremity - normal range of motion without pain. Right Lower Extremity - normal range of motion without pain.  Assessment & Plan Wyatt Page MD; 01/09/2015 5:51 PM) Atherosclerosis of native coronary artery of native heart with angina pectoris (I25.119) Story: Cardiac cath 06/05/99: H/O Inf-lat infarct 1999, s/p dominant circumflex coronary artery stents. Patent. 20% scattered disease in LAD, Cx. EF 50%: Has had several stents (Circumflex coronary artery) and 3 MI's (first one in 1999).  Lexiscan myoview stress test 12/16/2014: 1. Resting EKG demonstrated normal sinus rhythm, left axis deviation, left anterior fascicular block. Right bundle branch block. Stress EKG is nondiagnostic for ischemia as is a pharmacologic stress test. Stress symptoms included shortness of breath 2. Perfusion imaging study demonstrates a moderate-sized inferior, inferolateral scar with very minimal  peri-infarct ischemia. In addition there is anterior wall moderate ischemia extending from the base towards the mid ventricle. Left ventricle systolic function calculated by QGS was 24% with global hypokinesis. Compared to the study done on 02/05/2011, anterior wall ischemia is new and EF was previously 33%. This represents at least an intermediate risk scan, clinical correlation is recommended. Impression: EKG 12/23/2014: Normal sinus rhythm at rate 68 bpm, left atrial enlargement, left axis deviation, left anterior fascicular block. Right bundle branch block. LVH with repolarization abnormality. No significant change from EKG 02/11/2014. Current Plans Changed Metoprolol Succinate ER 25MG, 1 (one) Tablet ER 24HR daily, #90, 01/09/2015, Ref. x1. Future Plans 09/28/7652: METABOLIC PANEL, BASIC (65035) - one time 02/03/2015: CBC & PLATELETS (AUTO) (46568) - one time 02/03/2015: PT (PROTHROMBIN TIME) (12751) - one time Dyspnea on exertion (R06.09) Story: Echocardiogram 12/31/2014: Left ventricle cavity is normal in size. Basal inferolateral, Mid inferolateral, Mid inferior and Apical inferior hypokinesis. LVEF mildly depressed and estimated at 45% Moderate concentric hypertrophy of the left ventricle. Doppler evidence of grade Left atrial cavity is mildly dilated. Moderate aortic regurgitation. Mild mitral regurgitation. Compared to Echo 08/09/11: Normal LVEF. Mod LVH. Mild diastolic dysfunction. Mild AI. Essential hypertension, benign (I10) Controlled type 2 diabetes mellitus with stage 2 chronic kidney disease, without long-term current use of insulin (E11.22) Labwork Story: Labs 03/18/2014: Serum glucose 93 mg, BUN 16, serum creatinine 1.1, eGFR 65 mL. Potassium 3.9. CMP otherwise normal. CBC normal, Labs 02/25/2014: CBC normal, BUN 22, creatinine 1.4, CMP otherwise normal. Labs 03/14/2013: BUN 16, serum creatinine 1.2 with eGFR 63 mL. Electrolytes normal, CMP normal. HB 13.4/HCT 40.4. Platelet count normal.  MCV elevated at 101.  Labs 02/25/2014: HbA1c 6.4%. Labs 03/14/2013: HbA1c 6.5% HbA1c 09/13/2012 6.2%.  Labs 02/25/2014: total cholesterol 98, triglycerides 98, HDL 43, LDL 132, Vit D 51.5. Labs 03/14/2013: Total cholesterol 200, triglycerides 76, HDL 53, LDL 132. Non-HDL cholesterol 147. 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 Consider carotid duplex. Resolved the following month.  Carotid artery duplex 12/31/2014: Minimal stenosis in the right internal carotid artery (minimal). Diffuse mixed plaque bilateral. Antegrade right vertebral artery flow. Antegrade left vertebral artery flow. No significant chagne since 08/12/2011. Mixed hyperlipidemia (E78.2)   Current Plans Mechanism of underlying disease process and action of medications discussed with the patient. I discussed primary/secondary prevention and also dietary counceling was done. I reviewed the results of the stress test and echocardiogram the patient, clearly now he has class  III symptoms of angina pectoris with dyspnea hence I recommended that we proceed with coronary angiography. Also there is change in EF compared to prior echocardiogram.  Patient understands that if I indeed proceed with coronary angioplasty, he will need long-term dual antiplatelet therapy. This may mean postponement of his upcoming knee surgery. Patient understands this.  His blood pressure slightly elevated, I will increase metoprolol succinate from 25 mg half tablet daily to one full tablet daily. Schedule for cardiac catheterization, and possible angioplasty. We discussed regarding risks, benefits, alternatives to this including stress testing, CTA and continued medical therapy. Patient wants to proceed. Understands <1-2% risk of death, stroke, MI, urgent CABG, bleeding, infection, renal failure but not limited to these. Video recording of the procedure shown to the patient.  CC: Melrose Nakayama, MD. CC Dr. Jani Gravel.  Addendum Note(Bridgette Allison AGNP-C; 02/06/2015 9:11 AM) 02/01/2015: Serum glucose 101, creatinine 1.22, CBC normal, PT/INR normal  Labs stable to proceed with coronary angiogram.  Signed by Wyatt Page, MD (01/09/2015 5:52 PM)

## 2015-02-11 ENCOUNTER — Ambulatory Visit (HOSPITAL_COMMUNITY)
Admission: RE | Admit: 2015-02-11 | Discharge: 2015-02-12 | Disposition: A | Discharge status: Home / Self Care | Payer: Medicare Other | Source: Ambulatory Visit | Attending: Cardiology | Admitting: Cardiology

## 2015-02-11 ENCOUNTER — Encounter (HOSPITAL_COMMUNITY)
Admission: RE | Disposition: A | Discharge status: Home / Self Care | Payer: Self-pay | Source: Ambulatory Visit | Attending: Cardiology

## 2015-02-11 ENCOUNTER — Encounter (HOSPITAL_COMMUNITY): Payer: Self-pay | Admitting: Cardiology

## 2015-02-11 DIAGNOSIS — Z7984 Long term (current) use of oral hypoglycemic drugs: Secondary | ICD-10-CM | POA: Diagnosis not present

## 2015-02-11 DIAGNOSIS — Z7982 Long term (current) use of aspirin: Secondary | ICD-10-CM | POA: Diagnosis not present

## 2015-02-11 DIAGNOSIS — I25119 Atherosclerotic heart disease of native coronary artery with unspecified angina pectoris: Secondary | ICD-10-CM | POA: Diagnosis not present

## 2015-02-11 DIAGNOSIS — I209 Angina pectoris, unspecified: Secondary | ICD-10-CM | POA: Diagnosis present

## 2015-02-11 DIAGNOSIS — Z7951 Long term (current) use of inhaled steroids: Secondary | ICD-10-CM | POA: Insufficient documentation

## 2015-02-11 DIAGNOSIS — I723 Aneurysm of iliac artery: Secondary | ICD-10-CM

## 2015-02-11 DIAGNOSIS — I714 Abdominal aortic aneurysm, without rupture, unspecified: Secondary | ICD-10-CM

## 2015-02-11 DIAGNOSIS — Z87891 Personal history of nicotine dependence: Secondary | ICD-10-CM | POA: Diagnosis not present

## 2015-02-11 DIAGNOSIS — E782 Mixed hyperlipidemia: Secondary | ICD-10-CM | POA: Insufficient documentation

## 2015-02-11 DIAGNOSIS — I129 Hypertensive chronic kidney disease with stage 1 through stage 4 chronic kidney disease, or unspecified chronic kidney disease: Secondary | ICD-10-CM | POA: Insufficient documentation

## 2015-02-11 DIAGNOSIS — N183 Chronic kidney disease, stage 3 (moderate): Secondary | ICD-10-CM | POA: Diagnosis not present

## 2015-02-11 DIAGNOSIS — Z955 Presence of coronary angioplasty implant and graft: Secondary | ICD-10-CM | POA: Diagnosis not present

## 2015-02-11 DIAGNOSIS — I252 Old myocardial infarction: Secondary | ICD-10-CM | POA: Insufficient documentation

## 2015-02-11 DIAGNOSIS — Z79899 Other long term (current) drug therapy: Secondary | ICD-10-CM | POA: Insufficient documentation

## 2015-02-11 DIAGNOSIS — E1122 Type 2 diabetes mellitus with diabetic chronic kidney disease: Secondary | ICD-10-CM | POA: Diagnosis not present

## 2015-02-11 DIAGNOSIS — Z791 Long term (current) use of non-steroidal anti-inflammatories (NSAID): Secondary | ICD-10-CM | POA: Insufficient documentation

## 2015-02-11 DIAGNOSIS — I2510 Atherosclerotic heart disease of native coronary artery without angina pectoris: Secondary | ICD-10-CM

## 2015-02-11 DIAGNOSIS — Z9861 Coronary angioplasty status: Secondary | ICD-10-CM

## 2015-02-11 DIAGNOSIS — I25118 Atherosclerotic heart disease of native coronary artery with other forms of angina pectoris: Secondary | ICD-10-CM

## 2015-02-11 HISTORY — DX: Pylorospasm, not elsewhere classified: K31.3

## 2015-02-11 HISTORY — DX: Angina pectoris, unspecified: I20.9

## 2015-02-11 HISTORY — PX: CARDIAC CATHETERIZATION: SHX172

## 2015-02-11 HISTORY — DX: Cardiac murmur, unspecified: R01.1

## 2015-02-11 HISTORY — DX: Cerebral infarction, unspecified: I63.9

## 2015-02-11 HISTORY — DX: Type 2 diabetes mellitus without complications: E11.9

## 2015-02-11 HISTORY — DX: Pneumonia, unspecified organism: J18.9

## 2015-02-11 LAB — GLUCOSE, CAPILLARY
GLUCOSE-CAPILLARY: 89 mg/dL (ref 65–99)
GLUCOSE-CAPILLARY: 102 mg/dL — AB (ref 65–99)
Glucose-Capillary: 135 mg/dL — ABNORMAL HIGH (ref 65–99)
Glucose-Capillary: 121 mg/dL — ABNORMAL HIGH (ref 65–99)

## 2015-02-11 LAB — POCT ACTIVATED CLOTTING TIME
Activated Clotting Time: 209 seconds
Activated Clotting Time: 245 seconds

## 2015-02-11 SURGERY — LEFT HEART CATH AND CORONARY ANGIOGRAPHY
Anesthesia: LOCAL

## 2015-02-11 MED ORDER — TICAGRELOR 90 MG PO TABS
ORAL_TABLET | ORAL | Status: DC | PRN
  Administered 2015-02-11: 180 mg via ORAL

## 2015-02-11 MED ORDER — SODIUM CHLORIDE 0.9 % IV SOLN: 250.0000 mL | INTRAVENOUS | Status: DC | PRN

## 2015-02-11 MED ORDER — CLOPIDOGREL BISULFATE 75 MG PO TABS
75.0000 mg | ORAL_TABLET | Freq: Every day | ORAL | Status: DC
  Administered 2015-02-12: 75 mg via ORAL
  Filled 2015-02-11: qty 1

## 2015-02-11 MED ORDER — NITROGLYCERIN 1 MG/10 ML FOR IR/CATH LAB
INTRA_ARTERIAL | Status: DC | PRN
  Administered 2015-02-11: 9 mL

## 2015-02-11 MED ORDER — ASPIRIN 81 MG PO CHEW
81.0000 mg | CHEWABLE_TABLET | ORAL | Status: AC
  Administered 2015-02-11: 81 mg via ORAL

## 2015-02-11 MED ORDER — SODIUM CHLORIDE 0.9 % IJ SOLN: 3.0000 mL | Freq: Two times a day (BID) | INTRAMUSCULAR | Status: DC

## 2015-02-11 MED ORDER — HEPARIN SODIUM (PORCINE) 1000 UNIT/ML IJ SOLN
INTRAMUSCULAR | Status: DC | PRN
  Administered 2015-02-11: 3000 [IU] via INTRAVENOUS
  Administered 2015-02-11: 2000 [IU] via INTRAVENOUS
  Administered 2015-02-11: 6500 [IU] via INTRAVENOUS
  Administered 2015-02-11: 2000 [IU] via INTRAVENOUS
  Administered 2015-02-11: 3000 [IU] via INTRAVENOUS

## 2015-02-11 MED ORDER — ASPIRIN 81 MG PO CHEW: 81.0000 mg | CHEWABLE_TABLET | Freq: Every day | ORAL | Status: DC

## 2015-02-11 MED ORDER — TICAGRELOR 90 MG PO TABS
ORAL_TABLET | ORAL | Status: AC
  Filled 2015-02-11: qty 1

## 2015-02-11 MED ORDER — DIAZEPAM 5 MG PO TABS: 5.0000 mg | ORAL_TABLET | Freq: Every evening | ORAL | Status: DC | PRN

## 2015-02-11 MED ORDER — LIDOCAINE HCL (PF) 1 % IJ SOLN
INTRAMUSCULAR | Status: AC
  Filled 2015-02-11: qty 30

## 2015-02-11 MED ORDER — MIDAZOLAM HCL 2 MG/2ML IJ SOLN
INTRAMUSCULAR | Status: AC
  Filled 2015-02-11: qty 2

## 2015-02-11 MED ORDER — VERAPAMIL HCL 2.5 MG/ML IV SOLN
INTRAVENOUS | Status: AC
  Filled 2015-02-11: qty 2

## 2015-02-11 MED ORDER — VERAPAMIL HCL 2.5 MG/ML IV SOLN
INTRA_ARTERIAL | Status: DC | PRN
  Administered 2015-02-11: 10:00:00 via INTRA_ARTERIAL

## 2015-02-11 MED ORDER — LIVING WELL WITH DIABETES BOOK
Freq: Once | Status: AC
  Administered 2015-02-11: 21:00:00
  Filled 2015-02-11: qty 1

## 2015-02-11 MED ORDER — IOHEXOL 350 MG/ML SOLN
INTRAVENOUS | Status: DC | PRN
  Administered 2015-02-11: 235 mL via INTRA_ARTERIAL

## 2015-02-11 MED ORDER — ASPIRIN 81 MG PO CHEW
81.0000 mg | CHEWABLE_TABLET | Freq: Every day | ORAL | Status: DC
Start: 2015-02-12 — End: 2015-02-12
  Administered 2015-02-12: 81 mg via ORAL
  Filled 2015-02-11: qty 1

## 2015-02-11 MED ORDER — HYDROMORPHONE HCL 1 MG/ML IJ SOLN
INTRAMUSCULAR | Status: DC | PRN
  Administered 2015-02-11: 0.5 mg via INTRAVENOUS

## 2015-02-11 MED ORDER — TRAMADOL HCL 50 MG PO TABS
50.0000 mg | ORAL_TABLET | Freq: Four times a day (QID) | ORAL | Status: DC | PRN
  Administered 2015-02-11: 50 mg via ORAL
  Filled 2015-02-11: qty 1

## 2015-02-11 MED ORDER — SODIUM CHLORIDE 0.9 % WEIGHT BASED INFUSION
1.0000 mL/kg/h | INTRAVENOUS | Status: DC
  Administered 2015-02-11: 1 mL/kg/h via INTRAVENOUS

## 2015-02-11 MED ORDER — ANGIOPLASTY BOOK
Freq: Once | Status: AC
Start: 2015-02-11 — End: 2015-02-11
  Administered 2015-02-11: 21:00:00
  Filled 2015-02-11: qty 1

## 2015-02-11 MED ORDER — NITROGLYCERIN 1 MG/10 ML FOR IR/CATH LAB
INTRA_ARTERIAL | Status: AC
  Filled 2015-02-11: qty 10

## 2015-02-11 MED ORDER — SODIUM CHLORIDE 0.9 % WEIGHT BASED INFUSION
3.0000 mL/kg/h | INTRAVENOUS | Status: AC
Start: 2015-02-11 — End: 2015-02-11

## 2015-02-11 MED ORDER — SODIUM CHLORIDE 0.9 % IJ SOLN: 3.0000 mL | INTRAMUSCULAR | Status: DC | PRN

## 2015-02-11 MED ORDER — HEPARIN (PORCINE) IN NACL 2-0.9 UNIT/ML-% IJ SOLN
INTRAMUSCULAR | Status: AC
  Filled 2015-02-11: qty 1000

## 2015-02-11 MED ORDER — NITROGLYCERIN 1 MG/10 ML FOR IR/CATH LAB
INTRA_ARTERIAL | Status: AC
Start: 2015-02-11 — End: 2015-02-11
  Filled 2015-02-11: qty 10

## 2015-02-11 MED ORDER — CLOPIDOGREL BISULFATE 75 MG PO TABS: 75.0000 mg | ORAL_TABLET | Freq: Every day | ORAL | Status: AC

## 2015-02-11 MED ORDER — ASPIRIN 81 MG PO CHEW
CHEWABLE_TABLET | ORAL | Status: AC
  Filled 2015-02-11: qty 1

## 2015-02-11 MED ORDER — MIDAZOLAM HCL 2 MG/2ML IJ SOLN
INTRAMUSCULAR | Status: DC | PRN
  Administered 2015-02-11: 2 mg via INTRAVENOUS

## 2015-02-11 MED ORDER — SODIUM CHLORIDE 0.9 % WEIGHT BASED INFUSION
3.0000 mL/kg/h | INTRAVENOUS | Status: DC
  Administered 2015-02-11: 3 mL/kg/h via INTRAVENOUS

## 2015-02-11 MED ORDER — HYDROMORPHONE HCL 1 MG/ML IJ SOLN
INTRAMUSCULAR | Status: AC
  Filled 2015-02-11: qty 1

## 2015-02-11 MED ORDER — NITROGLYCERIN 1 MG/10 ML FOR IR/CATH LAB
INTRA_ARTERIAL | Status: DC | PRN
  Administered 2015-02-11 (×2): 300 ug via INTRACORONARY

## 2015-02-11 MED ORDER — HEPARIN SODIUM (PORCINE) 1000 UNIT/ML IJ SOLN
INTRAMUSCULAR | Status: AC
  Filled 2015-02-11: qty 1

## 2015-02-11 MED ORDER — METFORMIN HCL 500 MG PO TABS: 500.0000 mg | ORAL_TABLET | Freq: Every day | ORAL | Status: AC

## 2015-02-11 SURGICAL SUPPLY — 17 items
BALLN EMERGE MR 2.5X15 (BALLOONS) ×2
BALLOON EMERGE MR 2.5X15 (BALLOONS) IMPLANT
CATH INFINITI 5FR JL5 (CATHETERS) ×1 IMPLANT
CATH OPTITORQUE TIG 4.0 5F (CATHETERS) ×2 IMPLANT
CATH VISTA GUIDE 6FR XB4 (CATHETERS) ×1 IMPLANT
CUTTING BAL FLEXTOME RX 2.5X6 (BALLOONS) ×1 IMPLANT
DEVICE RAD COMP TR BAND LRG (VASCULAR PRODUCTS) ×2 IMPLANT
GLIDESHEATH SLEND A-KIT 6F 20G (SHEATH) ×2 IMPLANT
KIT ENCORE 26 ADVANTAGE (KITS) ×1 IMPLANT
KIT HEART LEFT (KITS) ×2 IMPLANT
PACK CARDIAC CATHETERIZATION (CUSTOM PROCEDURE TRAY) ×2 IMPLANT
STENT RESOLUTE INTEG 2.75X14 (Permanent Stent) ×1 IMPLANT
STENT RESOLUTE INTEG 2.75X22 (Permanent Stent) ×1 IMPLANT
TRANSDUCER W/STOPCOCK (MISCELLANEOUS) ×2 IMPLANT
TUBING CIL FLEX 10 FLL-RA (TUBING) ×2 IMPLANT
WIRE COUGAR XT STRL 190CM (WIRE) ×1 IMPLANT
WIRE SAFE-T 1.5MM-J .035X260CM (WIRE) ×2 IMPLANT

## 2015-02-11 NOTE — Progress Notes (Signed)
TR BAND REMOVAL  LOCATION:    right radial  DEFLATED PER PROTOCOL:    Yes.    TIME BAND OFF / DRESSING APPLIED:    1700   SITE UPON ARRIVAL:    Level 1 ( bruised soft)  SITE AFTER BAND REMOVAL:    Level 1( bruised ,soft)  CIRCULATION SENSATION AND MOVEMENT:    Within Normal Limits   Yes.    COMMENTS:   Tolerated procedure well, Post TRB instructions given

## 2015-02-11 NOTE — Interval H&P Note (Signed)
History and Physical Interval Note:  02/11/2015 9:09 AM  [name]  has presented today for surgery, with the diagnosis of sob  The various methods of treatment have been discussed with the patient and family. After consideration of risks, benefits and other options for treatment, the patient has consented to  Procedure(s): Left Heart Cath and Coronary Angiography (N/A) and possible PTA as a surgical intervention .  The patient's history has been reviewed, patient examined, no change in status, stable for surgery.  I have reviewed the patient's chart and labs.  Questions were answered to the patient's satisfaction.    Ischemic Symptoms? CCS III (Marked limitation of ordinary activity) Anti-ischemic Medical Therapy? Maximal Medical Therapy (2 or more classes of medications) Non-invasive Test Results? Intermediate-risk stress test findings: cardiac mortality 1-3%/year Prior CABG? No Previous CABG   Patient Information:   1-2V CAD, no prox LAD  A (8)  Indication: 17; Score: 8   Patient Information:   CTO of 1 vessel, no other CAD  A (7)  Indication: 27; Score: 7   Patient Information:   1V CAD with prox LAD  A (9)  Indication: 33; Score: 9   Patient Information:   2V-CAD with prox LAD  A (9)  Indication: 39; Score: 9   Patient Information:   3V-CAD without LMCA  A (9)  Indication: 45; Score: 9   Patient Information:   3V-CAD without LMCA With Abnormal LV systolic function  A (9)  Indication: 48; Score: 9   Patient Information:   LMCA-CAD  A (9)  Indication: 49; Score: 9   Patient Information:   2V-CAD with prox LAD PCI  A (7)  Indication: 62; Score: 7   Patient Information:   2V-CAD with prox LAD CABG  A (8)  Indication: 62; Score: 8   Patient Information:   3V-CAD without LMCA With Low CAD burden(i.e., 3 focal stenoses, low SYNTAX score) PCI  A (7)  Indication: 63; Score: 7   Patient Information:   3V-CAD without LMCA With  Low CAD burden(i.e., 3 focal stenoses, low SYNTAX score) CABG  A (9)  Indication: 63; Score: 9   Patient Information:   3V-CAD without LMCA E06c - Intermediate-high CAD burden (i.e., multiple diffuse lesions, presence of CTO, or high SYNTAX score) PCI  U (4)  Indication: 64; Score: 4   Patient Information:   3V-CAD without LMCA E06c - Intermediate-high CAD burden (i.e., multiple diffuse lesions, presence of CTO, or high SYNTAX score) CABG  A (9)  Indication: 64; Score: 9   Patient Information:   LMCA-CAD With Isolated LMCA stenosis  PCI  U (6)  Indication: 65; Score: 6   Patient Information:   LMCA-CAD With Isolated LMCA stenosis  CABG  A (9)  Indication: 65; Score: 9   Patient Information:   LMCA-CAD Additional CAD, low CAD burden (i.e., 1- to 2-vessel additional involvement, low SYNTAX score) PCI  U (5)  Indication: 66; Score: 5   Patient Information:   LMCA-CAD Additional CAD, low CAD burden (i.e., 1- to 2-vessel additional involvement, low SYNTAX score) CABG  A (9)  Indication: 66; Score: 9   Patient Information:   LMCA-CAD Additional CAD, intermediate-high CAD burden (i.e., 3-vessel involvement, presence of CTO, or high SYNTAX score) PCI  I (3)  Indication: 67; Score: 3   Patient Information:   LMCA-CAD Additional CAD, intermediate-high CAD burden (i.e., 3-vessel involvement, presence of CTO, or high SYNTAX score) CABG  A (9)  Indication: 67; Score: 9  Adrian Prows

## 2015-02-11 NOTE — Progress Notes (Signed)
Hematoma just proximal to band w/bruising. Manual pressure X 20 minutes w/some improvement. Elevated on pillow. Instructions reviewed w/patient. Eating [place] sandwich. Waiting on 6500 bed

## 2015-02-12 DIAGNOSIS — E1122 Type 2 diabetes mellitus with diabetic chronic kidney disease: Secondary | ICD-10-CM | POA: Diagnosis not present

## 2015-02-12 DIAGNOSIS — E782 Mixed hyperlipidemia: Secondary | ICD-10-CM | POA: Diagnosis not present

## 2015-02-12 DIAGNOSIS — I714 Abdominal aortic aneurysm, without rupture: Secondary | ICD-10-CM | POA: Diagnosis not present

## 2015-02-12 DIAGNOSIS — I25119 Atherosclerotic heart disease of native coronary artery with unspecified angina pectoris: Secondary | ICD-10-CM | POA: Diagnosis not present

## 2015-02-12 LAB — CBC
HCT: 38.4 % — ABNORMAL LOW (ref 39.0–52.0)
HEMOGLOBIN: 12.2 g/dL — AB (ref 13.0–17.0)
MCH: 31.6 pg (ref 26.0–34.0)
MCHC: 31.8 g/dL (ref 30.0–36.0)
MCV: 99.5 fL (ref 78.0–100.0)
Platelets: 198 10*3/uL (ref 150–400)
RBC: 3.86 MIL/uL — AB (ref 4.22–5.81)
RDW: 14.6 % (ref 11.5–15.5)
WBC: 7 10*3/uL (ref 4.0–10.5)

## 2015-02-12 LAB — BASIC METABOLIC PANEL
ANION GAP: 6 (ref 5–15)
BUN: 15 mg/dL (ref 6–20)
CHLORIDE: 111 mmol/L (ref 101–111)
CO2: 25 mmol/L (ref 22–32)
CREATININE: 1.22 mg/dL (ref 0.61–1.24)
Calcium: 8.8 mg/dL — ABNORMAL LOW (ref 8.9–10.3)
GFR calc non Af Amer: 58 mL/min — ABNORMAL LOW (ref >60)
Glucose, Bld: 116 mg/dL — ABNORMAL HIGH (ref 65–99)
Potassium: 4 mmol/L (ref 3.5–5.1)
Sodium: 142 mmol/L (ref 135–145)

## 2015-02-12 LAB — GLUCOSE, CAPILLARY: Glucose-Capillary: 105 mg/dL — ABNORMAL HIGH (ref 65–99)

## 2015-02-12 NOTE — Discharge Instructions (Signed)
Coronary Angiogram With Stent, Care After °Refer to this sheet in the next few weeks. These instructions provide you with information about caring for yourself after your procedure. Your health care provider may also give you more specific instructions. Your treatment has been planned according to current medical practices, but problems sometimes occur. Call your health care provider if you have any problems or questions after your procedure. °WHAT TO EXPECT AFTER THE PROCEDURE  °After your procedure, it is typical to have the following: °· Bruising at the catheter insertion site that usually fades within 1-2 weeks. °· Blood collecting in the tissue (hematoma) that may be painful to the touch. It should usually decrease in size and tenderness within 1-2 weeks. °HOME CARE INSTRUCTIONS °· Take medicines only as directed by your health care provider. Blood thinners may be prescribed after your procedure to improve blood flow through the stent. °· You may shower 24-48 hours after the procedure or as directed by your health care provider. Remove the bandage (dressing) and gently wash the catheter insertion site with plain soap and water. Pat the area dry with a clean towel. Do not rub the site, because this may cause bleeding. °· Do not take baths, swim, or use a hot tub until your health care provider approves. °· Check your catheter insertion site every day for redness, swelling, or drainage. °· Do not apply powder or lotion to the site. °· Do not lift over 10 lb (4.5 kg) for 5 days after your procedure or as directed by your health care provider. °· Ask your health care provider when it is okay to: °¨ Return to work or school. °¨ Resume usual physical activities or sports. °¨ Resume sexual activity. °· Eat a heart-healthy diet. This should include plenty of fresh fruits and vegetables. Meat should be lean cuts. Avoid the following types of food: °¨ Food that is high in salt. °¨ Canned or highly processed food. °¨ Food  that is high in saturated fat or sugar. °¨ Fried food. °· Make any other lifestyle changes as recommended by your health care provider. These may include: °¨ Not using any tobacco products, including cigarettes, chewing tobacco, or electronic cigarettes. If you need help quitting, ask your health care provider. °¨ Managing your weight. °¨ Getting regular exercise. °¨ Managing your blood pressure. °¨ Limiting your alcohol intake. °¨ Managing other health problems, such as diabetes. °· If you need an MRI after your heart stent has been placed, be sure to tell the health care provider who orders the MRI that you have a heart stent. °· Keep all follow-up visits as directed by your health care provider. This is important. °SEEK MEDICAL CARE IF: °· You have a fever. °· You have chills. °· You have increased bleeding from the catheter insertion site. Hold pressure on the site. °SEEK IMMEDIATE MEDICAL CARE IF: °· You develop chest pain or shortness of breath, feel faint, or pass out. °· You have unusual pain at the catheter insertion site. °· You have redness, warmth, or swelling at the catheter insertion site. °· You have drainage (other than a small amount of blood on the dressing) from the catheter insertion site. °· The catheter insertion site is bleeding, and the bleeding does not stop after 30 minutes of holding steady pressure on the site. °· You develop bleeding from any other place, such as from your rectum. There may be bright red blood in your urine or stool, or it may appear as black, tarry stool. °  °  This information is not intended to replace advice given to you by your health care provider. Make sure you discuss any questions you have with your health care provider. °  °Document Released: 07/31/2004 Document Revised: 02/01/2014 Document Reviewed: 06/05/2012 °Elsevier Interactive Patient Education ©2016 [employer] ° °

## 2015-02-12 NOTE — Discharge Summary (Signed)
Physician Discharge Summary  Patient ID: [name] MRN: [id number] DOB/AGE: 1945/09/15 70 y.o.  Admit date: 02/11/2015 Discharge date: 02/12/2015  Discharge Diagnoses: 1. CAD s/p PTCA and stenting of the OM1 and Cx  2. Mixed hyperlipidemia 3. Benign Essential Hypertension 4. 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 AAA without rupture  Significant Diagnostic Studies: Coronary angiogram 02/11/2015:  1. PTCA andtwo overlapping stents 2.75 x 22 mm and a 2.75 x 14 mm resolute integrity stents applied in the large OM1 branch of Circumflex coronary artery, stenosis reduced from 95% to 0% with maintenance of TIMI-3 flow. 2. Although patient has history of angioplasty in 1999, no stents were visualized. There is mild disease in the circumflex and mild scattered disease in the LAD and nondominant RCA. 3. LVEF 45%. Mild inferior hypokinesis, mild global hypokinesis.  Hospital Course: [name] is a 70 year old caucasian male with history of known coronary artery disease, history of MI x 2 and PTCA in the past, previously has had multiple stents placed to his circumflex coronary artery. He was scheduled for right knee surgery and had undergone stress testing for preoperative risk stratification due to his cardiac risk factors, presents here for follow-up. He had previously been seen to discuss the results of the stress test, and although abnormal, medical therapy had been recommended as he was asymptomatic, however, he had been advised to increase his physical activity. Patient returned to the office stating that after he increased his physical activity, he noticed marked dyspnea on exertion. Given abnormal stress test and echocardiogram revealing decreased LVEF, as well as new symptoms consistent with Class II-III angina, he was scheduled for coronary angiogram.   He underwent coronary angiogram on 02/11/2015 with successful PTCA andstenting with two overlapping stents; 2.75 x 22 mm and a 2.75 x 14 mm  resolute integrity stents; to the large OM1 branch of Circumflex coronary artery, stenosis reduced from 95% to 0%. He initially reported shortness of breath yesterday after receiving Brilinta. This was changed to Plavix and he has not had any recurrence.   Recommendations on discharge:  Follow up outpatient as scheduled. Will need Dual antiplatelet therapy with Plavix and ASA 81 mg for at least one year, patient aware to postpone his knee replacement which is an elective procedure. Ambulated with cardiac rehab, mild SOB, no chest pain.   Discharge Exam: Blood pressure 149/84, pulse 62, temperature 97.7 F (36.5 C), temperature source Oral, resp. rate 15, height  (1.803 m), weight 87 kg (191 lb 12.8 oz), SpO2 98 %.    General appearance: alert, cooperative, appears stated age and no distress Neck: no adenopathy, no carotid bruit, no JVD, supple, symmetrical, trachea midline and thyroid not enlarged, symmetric, no tenderness/mass/nodules Resp: clear to auscultation bilaterally Chest wall: no tenderness Cardio: regular rate and rhythm, S1, S2 normal and systolic murmur: early systolic 2/6,   at 2nd right intercostal space Extremities: extremities normal, atraumatic, no cyanosis or edema Pulses: 2+ and symmetric Skin: Skin color, texture, turgor normal. No rashes or lesions Neurologic: Grossly normal  Labs:   Lab Results  Component Value Date   WBC 7.0 02/12/2015   HGB 12.2* 02/12/2015   HCT 38.4* 02/12/2015   MCV 99.5 02/12/2015   PLT 198 02/12/2015     Recent Labs Lab 02/12/15 0955  NA 142  K 4.0  CL 111  CO2 25  BUN 15  CREATININE 1.22  CALCIUM 8.8*  GLUCOSE 116*    Lipid Panel  No results  found for: CHOL, TRIG, HDL, CHOLHDL, VLDL, LDLCALC  BNP (last 3 results) No results for input(s): BNP in the last 8760 hours.  HEMOGLOBIN A1C Lab Results  Component Value Date   HGBA1C 6.7* 12/13/2014   MPG 146 12/13/2014    Cardiac Panel (last 3 results) No results for  input(s): CKTOTAL, CKMB, TROPONINI, RELINDX in the last 8760 hours.  Lab Results  Component Value Date   TROPONINI <0.30 07/11/2011     TSH No results for input(s): TSH in the last 8760 hours.  EKG 02/12/2015: sinus bradycardia at rate 48 bpm, left atrial enlargement, left axis deviation, left anterior fascicular block. Right bundle branch block. LVH with repolarization abnormality. No significant change from EKG 12/23/2014.  Radiology: No results found.    FOLLOW UP PLANS AND APPOINTMENTS Discharge Instructions    AMB Referral to Cardiac Rehabilitation - Phase II    Complete by:  As directed   Diagnosis:  PCI            Medication List    STOP taking these medications        ibuprofen 200 MG tablet  Commonly known as:  ADVIL,MOTRIN      TAKE these medications        aspirin 81 MG chewable tablet  Chew 1 tablet (81 mg total) by mouth daily.     b complex vitamins tablet  Take 1 tablet by mouth daily.     cholecalciferol 1000 units tablet  Commonly known as:  VITAMIN D  Take 5,000 Units by mouth daily.     clopidogrel 75 MG tablet  Commonly known as:  PLAVIX  Take 1 tablet (75 mg total) by mouth daily.     DULERA 200-5 MCG/ACT Aero  Generic drug:  mometasone-formoterol  Inhale 2 puffs into the lungs Twice daily.     fexofenadine 180 MG tablet  Commonly known as:  ALLEGRA  Take 1 tablet (180 mg total) by mouth daily.     fish oil-omega-3 fatty acids 1000 MG capsule  Take 1 g by mouth 2 (two) times daily.     fluticasone 50 MCG/ACT nasal spray  Commonly known as:  FLONASE  Place 1 spray into both nostrils 2 (two) times daily.     furosemide 40 MG tablet  Commonly known as:  LASIX  Take 40 mg by mouth daily.     galantamine 16 MG 24 hr capsule  Commonly known as:  RAZADYNE ER  Take 1 capsule (16 mg total) by mouth daily with breakfast.     galantamine 16 MG 24 hr capsule  Commonly known as:  RAZADYNE ER  TAKE ONE CAPSULE BY MOUTH ONCE DAILY      isosorbide mononitrate 30 MG 24 hr tablet  Commonly known as:  IMDUR  Take 30 mg by mouth daily.     metFORMIN 500 MG tablet  Commonly known as:  GLUCOPHAGE  Take 1 tablet (500 mg total) by mouth daily.  Start taking on:  02/13/2015     metoprolol succinate 25 MG 24 hr tablet  Commonly known as:  TOPROL-XL  Take 12.5 mg by mouth daily.     NAMENDA 10 MG tablet  Generic drug:  memantine  TAKE 1 TABLET BY MOUTH TWICE DAILY     nitroGLYCERIN 0.4 MG/SPRAY spray  Commonly known as:  NITROLINGUAL  Place 1 spray under the tongue every 5 (five) minutes as needed. For chest pain     ramipril 10 MG capsule  Commonly known  as:  ALTACE  Take 10 mg by mouth daily.     simvastatin 40 MG tablet  Commonly known as:  ZOCOR  Take 40 mg by mouth daily.     venlafaxine XR 75 MG 24 hr capsule  Commonly known as:  EFFEXOR-XR  Take 75 mg by mouth 2 (two) times daily.     VENTOLIN HFA 108 (90 Base) MCG/ACT inhaler  Generic drug:  albuterol  Inhale 2 puffs into the lungs every 6 (six) hours as needed for wheezing or shortness of breath.     VITAMIN B 12 PO  Take 1,000 mcg by mouth 2 (two) times daily.     vitamin C 500 MG tablet  Commonly known as:  ASCORBIC ACID  Take 500 mg by mouth 2 (two) times daily.     Vitamin D (Ergocalciferol) 50000 units Caps capsule  Commonly known as:  DRISDOL  Take 50,000 Units by mouth every 7 (seven) days.     vitamin E 200 UNIT capsule  Take 200 Units by mouth 2 (two) times daily.     ZETIA 10 MG tablet  Generic drug:  ezetimibe  Take 10 mg by mouth daily.       Follow-up Information    Follow up with Yates Decamp, MD.   Specialty:  Cardiology   Why:  Keep previous appointment   Contact information:   [address] [place] [address] [phone]        Erling Conte, NP-C  02/12/2015, 7:43 AM [facility] Pager: [phone] Office: [phone]

## 2015-02-12 NOTE — Progress Notes (Signed)
CARDIAC REHAB PHASE I   PRE:  Rate/Rhythm: 62 SR  BP:  Supine: 149/84  Sitting:   Standing:    SaO2:   MODE:  Ambulation: 275 ft   POST:  Rate/Rhythm: 79 SR  BP:  Supine:   Sitting: 151/88  Standing:    SaO2:  0750-0842 Pt walked 275 ft with steady gait. No CP. Slightly SOB. Did not have knee brace here so only took short walk. Discussed CRP 2 and will refer to [place]. Pt has attended before. Discussed carb counting and heart healthy food choices. Stressed importance of plavix with stents. Reviewed NTG use. Pt voiced understanding of ed.   Luetta Nutting, RN BSN  02/12/2015 8:39 AM

## 2015-02-18 ENCOUNTER — Encounter: Payer: Self-pay | Admitting: Vascular Surgery

## 2015-02-26 ENCOUNTER — Ambulatory Visit (HOSPITAL_COMMUNITY)
Admission: RE | Admit: 2015-02-26 | Discharge: 2015-02-26 | Disposition: A | Discharge status: Home / Self Care | Payer: Medicare Other | Source: Ambulatory Visit | Attending: Family | Admitting: Family

## 2015-02-26 ENCOUNTER — Encounter (HOSPITAL_COMMUNITY): Payer: Self-pay

## 2015-02-26 DIAGNOSIS — I714 Abdominal aortic aneurysm, without rupture, unspecified: Secondary | ICD-10-CM

## 2015-02-26 DIAGNOSIS — I700 Atherosclerosis of aorta: Secondary | ICD-10-CM | POA: Insufficient documentation

## 2015-02-26 DIAGNOSIS — Z87891 Personal history of nicotine dependence: Secondary | ICD-10-CM

## 2015-02-26 MED ORDER — IOHEXOL 350 MG/ML SOLN
100.0000 mL | Freq: Once | INTRAVENOUS | Status: AC | PRN
  Administered 2015-02-26: 100 mL via INTRAVENOUS

## 2015-02-27 ENCOUNTER — Other Ambulatory Visit: Payer: Self-pay

## 2015-02-27 ENCOUNTER — Encounter: Payer: Self-pay | Admitting: Vascular Surgery

## 2015-02-27 ENCOUNTER — Ambulatory Visit (INDEPENDENT_AMBULATORY_CARE_PROVIDER_SITE_OTHER): Payer: Medicare Other | Admitting: Vascular Surgery

## 2015-02-27 VITALS — BP 119/71 | HR 66 | Temp 97.5°F | Resp 16 | Ht 71.0 in | Wt 207.0 lb

## 2015-02-27 DIAGNOSIS — I714 Abdominal aortic aneurysm, without rupture, unspecified: Secondary | ICD-10-CM

## 2015-02-27 NOTE — Progress Notes (Signed)
HISTORY AND PHYSICAL   History of Present Illness:  Patient is a 70 y.o. male who presents for evaluation of AAA.    He has been followed for many years by Dr. Darrick Penna.  He was seen by our NP Rosalita Chessman Nickel on 01/30/2015 for a 6 month repeat AAA duplex.  He reports no back pain or abdominal pain.  He recently had a a cardiac cath by Dr. Jacinto Halim  Resulting in PTCA and Two overlapping stents 2.75 x 22 mm and a 2.75 x 14 mm resolute integrity stents applied in the large OM1 branch of Circumflex coronary artery.  He is now taking Asprin and Plavix daily.    He is here today for a follow up CTA  To compare with the duplex which shows an increase in the diameter of his AAA to 5.2 cm from 4.8 cm in six months.   Other medical problems include has Aneurysm of iliac artery (HCC); Essential tremor; Mild cognitive impairment with memory loss; Abdominal aneurysm without mention of rupture; AAA (abdominal aortic aneurysm) without rupture (HCC); Angina pectoris (HCC); CAD (coronary artery disease), native coronary artery; and Post PTCA on his problem list.  Past Medical History  Diagnosis Date  . Hypertension   . Coronary artery disease   . AAA (abdominal aortic aneurysm) (HCC)   . High cholesterol   . Depression   . Anxiety   . Essential tremor 08/18/2012  . Mild cognitive impairment with memory loss 08/18/2012  . CAD (coronary artery disease)   . Dementia   . Asthma   . Vitamin D deficiency   . H. pylori infection   . COPD (chronic obstructive pulmonary disease) (HCC)   . Anemia 2016  . Heart murmur     slight  . Myocardial infarction (HCC)     "I've had 3-4; last one was maybe 2006" (02/11/2015)  . Anginal pain (HCC)   . Pneumonia and influenza   . Pneumonia     "2-3 times" (02/11/2015)  . Type II diabetes mellitus (HCC)     Metformin  . Pyloric spasm 1970s-1980s  . Migraine     "havent' had one in years" (02/11/2015)  . Stroke [facility])     "Dr. Jacinto Halim suspects I've had a mild stroke or 2"  (02/11/2015)  . Arthritis     "knees" (02/11/2015)    Past Surgical History  Procedure Laterality Date  . Tonsillectomy  1964  . Appendectomy  1962  . Splenectomy  1952  . Coronary angioplasty with stent placement    . Cardiac catheterization N/A 02/11/2015    Procedure: Left Heart Cath and Coronary Angiography;  Surgeon: Yates Decamp, MD;  Location: [facility] INVASIVE CV LAB;  Service: Cardiovascular;  Laterality: N/A;    Social History Social History  Substance Use Topics  . Smoking status: Former Smoker -- 1.00 packs/day for 50 years    Types: Cigarettes    Quit date: 07/09/2014  . Smokeless tobacco: Never Used  . 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 Cancer Paternal Grandmother   . Cirrhosis Mother     Current Outpatient Prescriptions  Medication Sig Dispense Refill  . aspirin 81 MG chewable tablet Chew 1 tablet (81 mg total) by mouth daily.    Marland Kitchen  b complex vitamins tablet Take 1 tablet by mouth daily. (Patient taking differently: Take 1 tablet by mouth 2 (two) times daily. ) 90 tablet 3  . cholecalciferol (VITAMIN D) 1000 UNITS tablet Take 5,000 Units by mouth daily.    . clopidogrel (PLAVIX) 75 MG tablet Take 1 tablet (75 mg total) by mouth daily. 90 tablet 3  . Cyanocobalamin (VITAMIN B 12 PO) Take 1,000 mcg by mouth 2 (two) times daily.     . DULERA 200-5 MCG/ACT AERO Inhale 2 puffs into the lungs Twice daily.     . fexofenadine (ALLEGRA) 180 MG tablet Take 1 tablet (180 mg total) by mouth daily. 90 tablet 3  . fish oil-omega-3 fatty acids 1000 MG capsule Take 1 g by mouth 2 (two) times daily.    . fluticasone (FLONASE) 50 MCG/ACT nasal spray Place 1 spray into both nostrils 2 (two) times daily.     . furosemide (LASIX) 40 MG tablet Take 40 mg by mouth daily.    Marland Kitchen galantamine  (RAZADYNE ER) 16 MG 24 hr capsule Take 1 capsule (16 mg total) by mouth daily with breakfast. 90 capsule 3  . galantamine (RAZADYNE ER) 16 MG 24 hr capsule TAKE ONE CAPSULE BY MOUTH ONCE DAILY 90 capsule 3  . isosorbide mononitrate (IMDUR) 30 MG 24 hr tablet Take 30 mg by mouth daily.    . metFORMIN (GLUCOPHAGE) 500 MG tablet Take 1 tablet (500 mg total) by mouth daily.  11  . metoprolol succinate (TOPROL-XL) 25 MG 24 hr tablet Take 12.5 mg by mouth daily.    Marland Kitchen NAMENDA 10 MG tablet TAKE 1 TABLET BY MOUTH TWICE DAILY 180 tablet 3  . nitroGLYCERIN (NITROLINGUAL) 0.4 MG/SPRAY spray Place 1 spray under the tongue every 5 (five) minutes as needed. For chest pain    . ramipril (ALTACE) 10 MG capsule Take 10 mg by mouth daily.    . simvastatin (ZOCOR) 40 MG tablet Take 40 mg by mouth daily.    Marland Kitchen venlafaxine XR (EFFEXOR-XR) 75 MG 24 hr capsule Take 75 mg by mouth 2 (two) times daily.     . VENTOLIN HFA 108 (90 BASE) MCG/ACT inhaler Inhale 2 puffs into the lungs every 6 (six) hours as needed for wheezing or shortness of breath.   12  . vitamin C (ASCORBIC ACID) 500 MG tablet Take 500 mg by mouth 2 (two) times daily.    . Vitamin D, Ergocalciferol, (DRISDOL) 50000 UNITS CAPS capsule Take 50,000 Units by mouth every 7 (seven) days.    . vitamin E 200 UNIT capsule Take 200 Units by mouth 2 (two) times daily.    Marland Kitchen ZETIA 10 MG tablet Take 10 mg by mouth daily.   4   No current facility-administered medications for this visit.    ROS:   General:  No weight loss, Fever, chills  HEENT: No recent headaches, no nasal bleeding, no visual changes, no sore throat  Neurologic: No dizziness, blackouts, seizures. No recent symptoms of stroke or mini- stroke. No recent episodes of slurred speech, or temporary blindness.  Cardiac: No recent episodes of chest pain/pressure, no shortness of breath at rest.  positive shortness of breath with exertion.  Denies history of atrial fibrillation or irregular  heartbeat  Vascular: No history of rest pain in feet.  No history of claudication.  No history of non-healing ulcer, No history of DVT   Pulmonary: No home oxygen, no productive cough, no hemoptysis,  No asthma or wheezing  Musculoskeletal:  [  x ] Arthritis,  Low back pain,  [x ] Joint pain  Hematologic:No history of hypercoagulable state.  No history of easy bleeding.  No history of anemia  Gastrointestinal: No hematochezia or melena,  No gastroesophageal reflux, no trouble swallowing  Urinary:  chronic Kidney disease,  on HD -  MWF or  TTHS,  Burning with urination,  Frequent urination,  Difficulty urinating;   Skin: No rashes  Psychological: No history of anxiety,  No history of depression   Physical Examination  Filed Vitals:   02/27/15 1414  BP: 119/71  Pulse: 66  Temp: 97.5 F (36.4 C)  TempSrc: Oral  Resp: 16  Height:  (1.803 m)  Weight: 207 lb (93.895 kg)  SpO2: 97%    Body mass index is 28.88 kg/(m^2).  General:  Alert and oriented, no acute distress HEENT: Normal Neck: No bruit or JVD Pulmonary: Clear to auscultation bilaterally Cardiac: Regular Rate and Rhythm without murmur Abdomen: Soft, non-tender, non-distended, no mass, no scars Skin: No rash Extremity Pulses:  2+ radial, brachial, femoral, dorsalis pedis  pulses bilaterally Musculoskeletal: No deformity or edema  Neurologic: Upper and lower extremity motor 5/5 and symmetric  DATA:   CTA  02/26/2015 IMPRESSION: 5.7 cm infrarenal fusiform atherosclerotic abdominal aortic aneurysm, previously measuring 5.2 cm  ASSESSMENT/PLAN:   Expanding asymptomatics AAA 5.7 cm in diameter Dr. Darrick Penna reviewed both the CTA and the AAA duplex.  He is recommending EVAR AAA repair.  We will schedule the surgery for Feb 27 th.   If the AAA can not be fixed via EVAR we would have to perform an open AAA repair.   We may have to wait 1 year since he has just had a cardiac stent placed and  needs to be on Plavix for 1 year.       Thomasena Edis EMMA [facility] PA-C [facility] [place] Office: [phone]  Dr. Darrick Penna saw the patient in clinic today  History and exam details as above. Patient has had a slowly growing infrarenal abdominal aortic aneurysm. I reviewed his CT images today. The aneurysm is now 5.7 cm in diameter. It is infrarenal. However it is a fairly short and dilated neck. He will most likely require 36 graft. I will confer with the [employer] rep to make sure that we have adequate seal zone. If so we will proceed with endovascular repair on February 27. If he requires open repair instead we may defer this for a while until his coronary stents are completely endothelialized because I would not want to do an open repair while on Plavix and aspirin combined. I am willing to do endovascular repair with both of these antiplatelet agents on board. All of this was discussed with the patient today as well as follow-up required for endovascular aneurysm repair.  Fabienne Bruns, MD [facility] [place] Office: [phone] Pager: [phone]

## 2015-02-28 ENCOUNTER — Telehealth: Payer: Self-pay | Admitting: Vascular Surgery

## 2015-02-28 NOTE — Telephone Encounter (Signed)
Pt CT measured today.  Not a candidate for stent graft due to large reverse taper neck  Will proceed with open repair  In light of his recent cardiac cath and stenting will need to wait 3 months to hold plavix.  Discussed with Dr Nadara Eaton and pt.  Follow up appt in 3 months to plan open  AAA repair  Pt told to go to ER if severe abd or back pain  Fabienne Bruns, MD [facility] [place] Office: [phone] Pager: [phone]

## 2015-03-07 ENCOUNTER — Telehealth: Payer: Self-pay | Admitting: Vascular Surgery

## 2015-03-07 NOTE — Telephone Encounter (Signed)
-----   Message from Glori Bickers, RN sent at 03/06/2015 10:33 AM EST ----- Regarding: 3 month OV with CE[facility] Mr. Votta needs a 3 month OV with Dr. Darrick Penna to discuss open AAA repair.  Thanks, Judeth Cornfield ----- Message -----    From: Sherren Kerns, MD    Sent: 03/02/2015   2:44 PM      To: Glori Bickers, RN  Thanks.  I called him and discussed it with him  Leonette Most ----- Message -----    From: Glori Bickers, RN    Sent: 02/28/2015   3:36 PM      To: Sherren Kerns, MD  Okay, so I am going to cancel his surgery and wait to call him next week for the 3 month appointment.   Thanks, Judeth Cornfield ----- Message -----    From: Sherren Kerns, MD    Sent: 02/28/2015   3:32 PM      To: Glori Bickers, RN  I need to call pt.  I talked to [place].  We need to see him in 3 months and discuss open repair ----- Message -----    From: Glori Bickers, RN    Sent: 02/28/2015   1:21 PM      To: Sherren Kerns, MD  Per Jonny Ruiz, patient is not an EVAR candidate. Do you want me to keep him on 2/27 but schedule for an Open AAA Repair?  Thanks, Judeth Cornfield

## 2015-03-07 NOTE — Telephone Encounter (Signed)
LVM for pt re appt, dpm °

## 2015-03-18 ENCOUNTER — Telehealth (HOSPITAL_COMMUNITY): Payer: Self-pay | Admitting: *Deleted

## 2015-03-19 ENCOUNTER — Ambulatory Visit (INDEPENDENT_AMBULATORY_CARE_PROVIDER_SITE_OTHER): Payer: Medicare Other | Admitting: Neurology

## 2015-03-19 ENCOUNTER — Encounter: Payer: Self-pay | Admitting: Neurology

## 2015-03-19 VITALS — BP 158/92 | HR 88 | Resp 20 | Ht 71.0 in | Wt 206.0 lb

## 2015-03-19 DIAGNOSIS — G3184 Mild cognitive impairment, so stated: Secondary | ICD-10-CM

## 2015-03-19 DIAGNOSIS — R930 Abnormal findings on diagnostic imaging of skull and head, not elsewhere classified: Secondary | ICD-10-CM | POA: Insufficient documentation

## 2015-03-19 DIAGNOSIS — G3101 Pick's disease: Secondary | ICD-10-CM

## 2015-03-19 DIAGNOSIS — F0280 Dementia in other diseases classified elsewhere without behavioral disturbance: Secondary | ICD-10-CM | POA: Diagnosis not present

## 2015-03-19 MED ORDER — GALANTAMINE HYDROBROMIDE ER 16 MG PO CP24: 16.0000 mg | ORAL_CAPSULE | Freq: Every day | ORAL | Status: AC

## 2015-03-19 MED ORDER — MEMANTINE HCL 10 MG PO TABS: 10.0000 mg | ORAL_TABLET | Freq: Two times a day (BID) | ORAL | Status: AC

## 2015-03-19 NOTE — Patient Instructions (Signed)
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
## 2015-03-20 ENCOUNTER — Ambulatory Visit (HOSPITAL_COMMUNITY): Payer: Medicare Other

## 2015-03-24 ENCOUNTER — Ambulatory Visit (HOSPITAL_COMMUNITY): Payer: Medicare Other

## 2015-03-24 ENCOUNTER — Inpatient Hospital Stay: Admit: 2015-03-24 | Payer: Medicare Other | Admitting: Vascular Surgery

## 2015-03-24 SURGERY — INSERTION, ENDOVASCULAR STENT GRAFT, AORTA, ABDOMINAL
Anesthesia: General

## 2015-03-24 NOTE — Progress Notes (Signed)
The patient can follow up on a yearly basis .The patient is known to me .   Wyatt Dommer, MD

## 2015-03-26 ENCOUNTER — Ambulatory Visit (HOSPITAL_COMMUNITY): Payer: Medicare Other

## 2015-03-27 ENCOUNTER — Ambulatory Visit (HOSPITAL_COMMUNITY): Payer: Medicare Other

## 2015-03-28 ENCOUNTER — Ambulatory Visit (HOSPITAL_COMMUNITY): Payer: Medicare Other

## 2015-03-31 ENCOUNTER — Ambulatory Visit (HOSPITAL_COMMUNITY): Payer: Medicare Other

## 2015-04-02 ENCOUNTER — Ambulatory Visit (HOSPITAL_COMMUNITY): Payer: Medicare Other

## 2015-04-04 ENCOUNTER — Ambulatory Visit (HOSPITAL_COMMUNITY): Payer: Medicare Other

## 2015-04-07 ENCOUNTER — Ambulatory Visit (HOSPITAL_COMMUNITY): Payer: Medicare Other

## 2015-04-09 ENCOUNTER — Ambulatory Visit (HOSPITAL_COMMUNITY): Payer: Medicare Other

## 2015-04-11 ENCOUNTER — Ambulatory Visit (HOSPITAL_COMMUNITY): Payer: Medicare Other

## 2015-04-12 ENCOUNTER — Other Ambulatory Visit: Payer: Self-pay | Admitting: Neurology

## 2015-04-14 ENCOUNTER — Ambulatory Visit (HOSPITAL_COMMUNITY): Payer: Medicare Other

## 2015-04-16 ENCOUNTER — Ambulatory Visit (HOSPITAL_COMMUNITY): Payer: Medicare Other

## 2015-04-18 ENCOUNTER — Ambulatory Visit (HOSPITAL_COMMUNITY): Payer: Medicare Other

## 2015-04-21 ENCOUNTER — Ambulatory Visit (HOSPITAL_COMMUNITY): Payer: Medicare Other

## 2015-04-23 ENCOUNTER — Ambulatory Visit (HOSPITAL_COMMUNITY): Payer: Medicare Other

## 2015-04-25 ENCOUNTER — Ambulatory Visit (HOSPITAL_COMMUNITY): Payer: Medicare Other

## 2015-04-28 ENCOUNTER — Ambulatory Visit (HOSPITAL_COMMUNITY): Payer: Medicare Other

## 2015-04-30 ENCOUNTER — Ambulatory Visit (HOSPITAL_COMMUNITY): Payer: Medicare Other

## 2015-05-02 ENCOUNTER — Ambulatory Visit (HOSPITAL_COMMUNITY): Payer: Medicare Other

## 2015-05-05 ENCOUNTER — Ambulatory Visit (HOSPITAL_COMMUNITY): Payer: Medicare Other

## 2015-05-07 ENCOUNTER — Ambulatory Visit (HOSPITAL_COMMUNITY): Payer: Medicare Other

## 2015-05-09 ENCOUNTER — Ambulatory Visit (HOSPITAL_COMMUNITY): Payer: Medicare Other

## 2015-05-12 ENCOUNTER — Ambulatory Visit (HOSPITAL_COMMUNITY): Payer: Medicare Other

## 2015-05-14 ENCOUNTER — Ambulatory Visit (HOSPITAL_COMMUNITY): Payer: Medicare Other

## 2015-05-16 ENCOUNTER — Ambulatory Visit (HOSPITAL_COMMUNITY): Payer: Medicare Other

## 2015-05-19 ENCOUNTER — Ambulatory Visit (HOSPITAL_COMMUNITY): Payer: Medicare Other

## 2015-05-21 ENCOUNTER — Ambulatory Visit (HOSPITAL_COMMUNITY): Payer: Medicare Other

## 2015-05-23 ENCOUNTER — Ambulatory Visit (HOSPITAL_COMMUNITY): Payer: Medicare Other

## 2015-05-26 ENCOUNTER — Ambulatory Visit (HOSPITAL_COMMUNITY): Payer: Medicare Other

## 2015-05-28 ENCOUNTER — Ambulatory Visit (HOSPITAL_COMMUNITY): Payer: Medicare Other

## 2015-05-30 ENCOUNTER — Ambulatory Visit (HOSPITAL_COMMUNITY): Payer: Medicare Other

## 2015-05-30 ENCOUNTER — Encounter: Payer: Self-pay | Admitting: Vascular Surgery

## 2015-06-02 ENCOUNTER — Ambulatory Visit (HOSPITAL_COMMUNITY): Payer: Medicare Other

## 2015-06-04 ENCOUNTER — Ambulatory Visit (HOSPITAL_COMMUNITY): Payer: Medicare Other

## 2015-06-05 ENCOUNTER — Ambulatory Visit (INDEPENDENT_AMBULATORY_CARE_PROVIDER_SITE_OTHER): Payer: Medicare Other | Admitting: Vascular Surgery

## 2015-06-05 ENCOUNTER — Encounter: Payer: Self-pay | Admitting: Vascular Surgery

## 2015-06-05 VITALS — BP 126/77 | HR 69 | Ht 71.0 in | Wt 213.8 lb

## 2015-06-05 DIAGNOSIS — I714 Abdominal aortic aneurysm, without rupture, unspecified: Secondary | ICD-10-CM

## 2015-06-05 NOTE — Progress Notes (Signed)
HISTORY AND PHYSICAL     CC:  Discussion for AAA repair Referring Provider:  Pearson Grippe, MD  HPI: This is a 70 y.o. male who has been followed for many years by Dr. Darrick Penna for AAA.  He did have a cardiac catheterization in January requiring cardiac stents and is now on Plavix and aspirin daily.  He was seen in February with CTA after his duplex revealed an increase in size from 4.8cm to 5.2cm.  The CTA revealed a 5.7cm AAA.  Unfortunately, the pt is not a candidate for stent graft repair and is here today for discussion for open repair.  He has done well since the last visit.  He does have an occasional abdominal pain, but this is not constant.     He does take a beta blocker and ACEI for blood pressure management.  He is on metformin for diabetes.  He does take a statin for cholesterol management.  He is on Plavix and aspirin daily.   Past Medical History  Diagnosis Date  . Hypertension   . Coronary artery disease   . AAA (abdominal aortic aneurysm) (HCC)   . High cholesterol   . Depression   . Anxiety   . Essential tremor 08/18/2012  . Mild cognitive impairment with memory loss 08/18/2012  . CAD (coronary artery disease)   . Dementia   . Asthma   . Vitamin D deficiency   . H. pylori infection   . COPD (chronic obstructive pulmonary disease) (HCC)   . Anemia 2016  . Heart murmur     slight  . Myocardial infarction (HCC)     "I've had 3-4; last one was maybe 2006" (02/11/2015)  . Anginal pain (HCC)   . Pneumonia and influenza   . Pneumonia     "2-3 times" (02/11/2015)  . Type II diabetes mellitus (HCC)     Metformin  . Pyloric spasm 1970s-1980s  . Migraine     "havent' had one in years" (02/11/2015)  . Stroke [facility])     "Dr. Jacinto Halim suspects I've had a mild stroke or 2" (02/11/2015)  . Arthritis     "knees" (02/11/2015)    Past Surgical History  Procedure Laterality Date  . Tonsillectomy  1964  . Appendectomy  1962  . Splenectomy  1952  . Coronary angioplasty with stent  placement    . Cardiac catheterization N/A 02/11/2015    Procedure: Left Heart Cath and Coronary Angiography;  Surgeon: Yates Decamp, MD;  Location: [facility] INVASIVE CV LAB;  Service: Cardiovascular;  Laterality: N/A;     Current Outpatient Prescriptions  Medication Sig Dispense Refill  . aspirin 81 MG chewable tablet Chew 1 tablet (81 mg total) by mouth daily.    Marland Kitchen b complex vitamins tablet Take 1 tablet by mouth daily. (Patient taking differently: Take 1 tablet by mouth 2 (two) times daily. ) 90 tablet 3  . cholecalciferol (VITAMIN D) 1000 UNITS tablet Take 5,000 Units by mouth daily.    . clopidogrel (PLAVIX) 75 MG tablet Take 1 tablet (75 mg total) by mouth daily. 90 tablet 3  . Cyanocobalamin (VITAMIN B 12 PO) Take 1,000 mcg by mouth 2 (two) times daily.     . DULERA 200-5 MCG/ACT AERO Inhale 2 puffs into the lungs Twice daily.     . fexofenadine (ALLEGRA) 180 MG tablet Take 1 tablet (180 mg total) by mouth daily. 90 tablet 3  . fish oil-omega-3 fatty acids 1000 MG capsule Take 1 g by mouth 2 (  two) times daily.    . fluticasone (FLONASE) 50 MCG/ACT nasal spray Place 1 spray into both nostrils 2 (two) times daily.     . furosemide (LASIX) 40 MG tablet Take 40 mg by mouth daily.    Marland Kitchen. galantamine (RAZADYNE ER) 16 MG 24 hr capsule Take 1 capsule (16 mg total) by mouth daily with breakfast. 90 capsule 3  . galantamine (RAZADYNE ER) 16 MG 24 hr capsule Take 1 capsule (16 mg total) by mouth daily. 90 capsule 3  . isosorbide mononitrate (IMDUR) 30 MG 24 hr tablet Take 30 mg by mouth daily.    . memantine (NAMENDA) 10 MG tablet Take 1 tablet (10 mg total) by mouth 2 (two) times daily. 180 tablet 3  . metFORMIN (GLUCOPHAGE) 500 MG tablet Take 1 tablet (500 mg total) by mouth daily.  11  . metoprolol succinate (TOPROL-XL) 25 MG 24 hr tablet Take 12.5 mg by mouth daily.    . nitroGLYCERIN (NITROLINGUAL) 0.4 MG/SPRAY spray Place 1 spray under the tongue every 5 (five) minutes as needed. For chest pain    .  ramipril (ALTACE) 10 MG capsule Take 10 mg by mouth daily.    . simvastatin (ZOCOR) 40 MG tablet Take 40 mg by mouth daily.    Marland Kitchen. venlafaxine XR (EFFEXOR-XR) 75 MG 24 hr capsule Take 75 mg by mouth 2 (two) times daily.     . VENTOLIN HFA 108 (90 BASE) MCG/ACT inhaler Inhale 2 puffs into the lungs every 6 (six) hours as needed for wheezing or shortness of breath.   12  . vitamin C (ASCORBIC ACID) 500 MG tablet Take 500 mg by mouth 2 (two) times daily.    . Vitamin D, Ergocalciferol, (DRISDOL) 50000 UNITS CAPS capsule Take 50,000 Units by mouth every 7 (seven) days.    . vitamin E 200 UNIT capsule Take 200 Units by mouth 2 (two) times daily.    Marland Kitchen. ZETIA 10 MG tablet Take 10 mg by mouth daily.   4   No current facility-administered medications for this visit.    Family History  Problem Relation Age of Onset  . Cancer Paternal Grandmother   . Cirrhosis Mother     Social History   Social History  . Marital Status: Divorced    Spouse Name: N/A  . Number of Children: 1  . Years of Education: BS   Occupational History  . retired    Social History Main Topics  . Smoking status: Former Smoker -- 1.00 packs/day for 50 years    Types: Cigarettes    Quit date: 07/09/2014  . Smokeless tobacco: Never Used  . 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 Drug Use: No  . Sexual Activity: Not Currently   Other Topics Concern  . Not on file   Social History Narrative   Patient is divorced.   Patient has one child.   Patient is retired.   Patient has a BS degree in LobbyistComputer Science.   Patient is right-handed.   Patient drinks soda, coffee and tea- about 3-4 cups daily.           REVIEW OF SYSTEMS:   [X]  denotes positive finding, [ ]  denotes negative finding Cardiac  Comments:  Chest pain or chest pressure:    Shortness of breath upon exertion:  Short of breath when lying flat:    Irregular heart rhythm:        Vascular    Pain in calf, thigh, or hip brought on by ambulation:    Pain in feet at night that wakes you up from your sleep:     Blood clot in your veins:    Leg swelling:         Pulmonary    Oxygen at home:    Productive cough:     Wheezing:         Neurologic    Sudden weakness in arms or legs:     Sudden numbness in arms or legs:     Sudden onset of difficulty speaking or slurred speech:    Temporary loss of vision in one eye:     Problems with dizziness:         Gastrointestinal    Blood in stool:     Vomited blood:         Genitourinary    Burning when urinating:     Blood in urine:        Psychiatric    Major depression:         Hematologic    Bleeding problems:    Problems with blood clotting too easily:        Skin    Rashes or ulcers:        Constitutional    Fever or chills:      PHYSICAL EXAMINATION:  Filed Vitals:   06/05/15 1429  BP: 126/77  Pulse: 69   Body mass index is 29.83 kg/(m^2).  General:  WDWN in NAD; vital signs documented above Gait: Not observed HENT: WNL, normocephalic Pulmonary: normal non-labored breathing , without Rales, rhonchi,  wheezing Cardiac: regular HR, without  Murmurs, rubs or gallops; without carotid bruits Abdomen: soft, NT, no masses Skin: without rashes Vascular Exam/Pulses:  Right Left  Radial 2+ (normal) 2+ (normal)  Femoral 2+ (normal) 2+ (normal)  DP 2+ (normal) 2+ (normal)  PT 2+ (normal) Unable to palpate    Extremities: without ischemic changes, without Gangrene , without cellulitis; without open wounds;  Musculoskeletal: no muscle wasting or atrophy  Neurologic: A&O X 3;  No focal weakness or paresthesias are detected Psychiatric:  The pt has Normal affect.   Non-Invasive Vascular Imaging:   None today  CTA 02/26/15: IMPRESSION: 5.7 cm infrarenal fusiform atherosclerotic abdominal aortic aneurysm, previously measuring  5.2 cm  AAA duplex 01/30/15: Mild increase in the maximum diameter of the AAA when compared to previous exam (5.2cm)  Pt meds includes: Statin:  Yes.   Beta Blocker:  Yes.   Aspirin:  Yes.   ACEI:  Yes.   ARB:  No. Other Antiplatelet/Anticoagulant:  Yes.   Plavix   ASSESSMENT/PLAN:: 70 y.o. male with AAA, which has increased in size and in need of repair and here for further discussion for surgery   -Dr. Darrick PennaFields reviewed the pt's CTA today and the pt is not a candidate for endograft repair and will need open repair.  There is a possibility that he may need to clamp above the renals, which puts the pt at a slightly higher risk for renal failure.   -there is <5% mortality rate -the pt is on Plavix for his cardiac stents that were placed in January by Dr. Jacinto HalimGanji.  He will need to be contacted for discussion on discontinuing Plavix prior to surgery and keeping just the aspirin.   -will plan  to schedule this the first week of June -all of pt's questions are answered by Dr. Darrick Penna as well as risks and benefits of the surgery.    Doreatha Massed, PA-C [facility] [phone]  Clinic MD:  Pt seen and examined in conjunction with Dr. Darrick Penna   History and exam details as above. Patient needs repair of infrarenal abdominal aortic aneurysm to prevent rupture. Current aneurysm diameter 5.7 cm. Review of his CT scan shows that he will most likely need suprarenal clamping and open repair. We had delayed his aneurysm repair for several months until he can be on single antiplatelet agent therapy with aspirin alone due to his previous coronary disease. We have tentatively scheduled him to have his aneurysm repair on 2015/07/03. Risks benefits possible consultations and procedure details including but not limited to leading risk of transfusion 20%, infection 1% myocardial events 5% death less than 5% renal failure less than 5% patient understands and agrees to proceed. I will touch base with  Dr. Nadara Eaton to make sure it is okay to stop his Plavix 10 days prior to his abdominal aortic aneurysm repair. We can then restart this postoperatively.  Fabienne Bruns, MD [facility] [place] Office: [phone] Pager: [phone]

## 2015-06-06 ENCOUNTER — Ambulatory Visit (HOSPITAL_COMMUNITY): Payer: Medicare Other

## 2015-06-06 ENCOUNTER — Other Ambulatory Visit: Payer: Self-pay

## 2015-06-09 ENCOUNTER — Ambulatory Visit (HOSPITAL_COMMUNITY): Payer: Medicare Other

## 2015-06-11 ENCOUNTER — Ambulatory Visit (HOSPITAL_COMMUNITY): Payer: Medicare Other

## 2015-06-13 ENCOUNTER — Ambulatory Visit (HOSPITAL_COMMUNITY): Payer: Medicare Other

## 2015-06-16 ENCOUNTER — Ambulatory Visit (HOSPITAL_COMMUNITY): Payer: Medicare Other

## 2015-06-18 ENCOUNTER — Ambulatory Visit (HOSPITAL_COMMUNITY): Payer: Medicare Other

## 2015-06-19 ENCOUNTER — Encounter: Payer: Self-pay | Admitting: Vascular Surgery

## 2015-06-20 ENCOUNTER — Ambulatory Visit (HOSPITAL_COMMUNITY): Payer: Medicare Other

## 2015-06-23 ENCOUNTER — Ambulatory Visit (HOSPITAL_COMMUNITY): Payer: Medicare Other

## 2015-06-25 ENCOUNTER — Encounter (HOSPITAL_COMMUNITY)
Admission: RE | Admit: 2015-06-25 | Discharge: 2015-06-25 | Disposition: A | Discharge status: Home / Self Care | Payer: Medicare Other | Source: Ambulatory Visit | Attending: Vascular Surgery | Admitting: Vascular Surgery

## 2015-06-25 ENCOUNTER — Ambulatory Visit (HOSPITAL_COMMUNITY): Payer: Medicare Other

## 2015-06-25 ENCOUNTER — Encounter (HOSPITAL_COMMUNITY): Payer: Self-pay

## 2015-06-25 ENCOUNTER — Other Ambulatory Visit (HOSPITAL_COMMUNITY): Payer: Self-pay | Admitting: *Deleted

## 2015-06-25 DIAGNOSIS — J449 Chronic obstructive pulmonary disease, unspecified: Secondary | ICD-10-CM | POA: Insufficient documentation

## 2015-06-25 DIAGNOSIS — I6523 Occlusion and stenosis of bilateral carotid arteries: Secondary | ICD-10-CM | POA: Insufficient documentation

## 2015-06-25 DIAGNOSIS — I252 Old myocardial infarction: Secondary | ICD-10-CM | POA: Insufficient documentation

## 2015-06-25 DIAGNOSIS — Z87891 Personal history of nicotine dependence: Secondary | ICD-10-CM | POA: Insufficient documentation

## 2015-06-25 DIAGNOSIS — I10 Essential (primary) hypertension: Secondary | ICD-10-CM | POA: Insufficient documentation

## 2015-06-25 DIAGNOSIS — F0390 Unspecified dementia without behavioral disturbance: Secondary | ICD-10-CM | POA: Diagnosis not present

## 2015-06-25 DIAGNOSIS — E119 Type 2 diabetes mellitus without complications: Secondary | ICD-10-CM | POA: Insufficient documentation

## 2015-06-25 DIAGNOSIS — Z79899 Other long term (current) drug therapy: Secondary | ICD-10-CM | POA: Diagnosis not present

## 2015-06-25 DIAGNOSIS — Z955 Presence of coronary angioplasty implant and graft: Secondary | ICD-10-CM | POA: Insufficient documentation

## 2015-06-25 DIAGNOSIS — Z01818 Encounter for other preprocedural examination: Secondary | ICD-10-CM | POA: Diagnosis present

## 2015-06-25 DIAGNOSIS — Z01812 Encounter for preprocedural laboratory examination: Secondary | ICD-10-CM | POA: Diagnosis not present

## 2015-06-25 DIAGNOSIS — Z7984 Long term (current) use of oral hypoglycemic drugs: Secondary | ICD-10-CM | POA: Insufficient documentation

## 2015-06-25 DIAGNOSIS — Z0183 Encounter for blood typing: Secondary | ICD-10-CM | POA: Diagnosis not present

## 2015-06-25 DIAGNOSIS — Z8673 Personal history of transient ischemic attack (TIA), and cerebral infarction without residual deficits: Secondary | ICD-10-CM | POA: Diagnosis not present

## 2015-06-25 DIAGNOSIS — Z7902 Long term (current) use of antithrombotics/antiplatelets: Secondary | ICD-10-CM | POA: Insufficient documentation

## 2015-06-25 DIAGNOSIS — I714 Abdominal aortic aneurysm, without rupture: Secondary | ICD-10-CM | POA: Diagnosis not present

## 2015-06-25 DIAGNOSIS — I2510 Atherosclerotic heart disease of native coronary artery without angina pectoris: Secondary | ICD-10-CM | POA: Diagnosis not present

## 2015-06-25 DIAGNOSIS — Z7982 Long term (current) use of aspirin: Secondary | ICD-10-CM | POA: Insufficient documentation

## 2015-06-25 DIAGNOSIS — E785 Hyperlipidemia, unspecified: Secondary | ICD-10-CM | POA: Insufficient documentation

## 2015-06-25 LAB — BLOOD GAS, ARTERIAL
ACID-BASE DEFICIT: 1.6 mmol/L (ref 0.0–2.0)
Bicarbonate: 21.9 mEq/L (ref 20.0–24.0)
DRAWN BY: 421801
FIO2: 0.21
O2 SAT: 95.6 %
PCO2 ART: 32.9 mmHg — AB (ref 35.0–45.0)
PO2 ART: 78.1 mmHg — AB (ref 80.0–100.0)
Patient temperature: 98.6
TCO2: 23 mmol/L (ref 0–100)
pH, Arterial: 7.439 (ref 7.350–7.450)

## 2015-06-25 LAB — URINALYSIS, ROUTINE W REFLEX MICROSCOPIC
Bilirubin Urine: NEGATIVE
GLUCOSE, UA: NEGATIVE mg/dL
HGB URINE DIPSTICK: NEGATIVE
Ketones, ur: NEGATIVE mg/dL
LEUKOCYTES UA: NEGATIVE
Nitrite: NEGATIVE
Protein, ur: NEGATIVE mg/dL
SPECIFIC GRAVITY, URINE: 1.009 (ref 1.005–1.030)
pH: 5.5 (ref 5.0–8.0)

## 2015-06-25 LAB — COMPREHENSIVE METABOLIC PANEL
ALK PHOS: 56 U/L (ref 38–126)
ALT: 34 U/L (ref 17–63)
AST: 27 U/L (ref 15–41)
Albumin: 3.8 g/dL (ref 3.5–5.0)
Anion gap: 10 (ref 5–15)
BUN: 12 mg/dL (ref 6–20)
CALCIUM: 8.9 mg/dL (ref 8.9–10.3)
CHLORIDE: 109 mmol/L (ref 101–111)
CO2: 21 mmol/L — ABNORMAL LOW (ref 22–32)
Creatinine, Ser: 1.32 mg/dL — ABNORMAL HIGH (ref 0.61–1.24)
GFR calc non Af Amer: 53 mL/min — ABNORMAL LOW (ref >60)
Glucose, Bld: 108 mg/dL — ABNORMAL HIGH (ref 65–99)
Potassium: 4.1 mmol/L (ref 3.5–5.1)
SODIUM: 140 mmol/L (ref 135–145)
TOTAL PROTEIN: 6.6 g/dL (ref 6.5–8.1)
Total Bilirubin: 0.6 mg/dL (ref 0.3–1.2)

## 2015-06-25 LAB — CBC
HCT: 41 % (ref 39.0–52.0)
HEMOGLOBIN: 13 g/dL (ref 13.0–17.0)
MCH: 31.3 pg (ref 26.0–34.0)
MCHC: 31.7 g/dL (ref 30.0–36.0)
MCV: 98.6 fL (ref 78.0–100.0)
PLATELETS: 205 10*3/uL (ref 150–400)
RBC: 4.16 MIL/uL — AB (ref 4.22–5.81)
RDW: 14.1 % (ref 11.5–15.5)
WBC: 6.5 10*3/uL (ref 4.0–10.5)

## 2015-06-25 LAB — APTT: aPTT: 27 seconds (ref 24–37)

## 2015-06-25 LAB — PROTIME-INR
INR: 1.03 (ref 0.00–1.49)
Prothrombin Time: 13.7 seconds (ref 11.6–15.2)

## 2015-06-25 LAB — SURGICAL PCR SCREEN
MRSA, PCR: NEGATIVE
Staphylococcus aureus: NEGATIVE

## 2015-06-25 LAB — GLUCOSE, CAPILLARY: Glucose-Capillary: 103 mg/dL — ABNORMAL HIGH (ref 65–99)

## 2015-06-25 NOTE — Pre-Procedure Instructions (Addendum)
[name]  06/25/2015      Your procedure is scheduled on Tuesday, 2015-07-10   Report to [facility] Entrance "A" Admitting Office at 5:30 AM.  Call this number if you have problems the morning of surgery: [phone]  Any questions prior to day of surgery, please call [phone] between 8 & 4 PM.   Remember:  Do not eat food or drink liquids after midnight Monday, 06/30/15.  Take these medicines the morning of surgery with A SIP OF WATER: Galantamine (Razadyne), Isosorbide Mononitrate (Imdur), Memantine (Namenda), Metoprolol (Toprol XL), Venlafaxine XR (Effexor-XR), Flonase, Ventolin inhaler - if needed (Bring with you day of surgery).  Stop Plavix as instructed by physician (06/26/15 per dr Jacinto Halim). Do not stop aspirin   Stop Vitamin E,D,C,B-12, Fish Oil and NSAIDS (Ibuprofen, Aleve, etc.) as of today  No metformin day of surgery    How to Manage Your Diabetes Before Surgery   Why is it important to control my blood sugar before and after surgery?   Improving blood sugar levels before and after surgery helps healing and can limit problems.  A way of improving blood sugar control is eating a healthy diet by:  - Eating less sugar and carbohydrates  - Increasing activity/exercise  - Talk with your doctor about reaching your blood sugar goals  High blood sugars (greater than 180 mg/dL) can raise your risk of infections and slow down your recovery so you will need to focus on controlling your diabetes during the weeks before surgery.  Make sure that the doctor who takes care of your diabetes knows about your planned surgery including the date and location.  How do I manage my blood sugars before surgery?   Check your blood sugar at least 4 times a day, 2 days before surgery to make sure that they are not too high or low.  Check your blood sugar the morning of your surgery when you wake up and every 2 hours until you get to the Short-Stay unit.  Treat a low  blood sugar (less than 70 mg/dL) with 1/2 cup of clear juice (cranberry or apple), 4 glucose tablets, OR glucose gel.  Recheck blood sugar in 15 minutes after treatment (to make sure it is greater than 70 mg/dL).  If blood sugar is not greater than 70 mg/dL on re-check, call [phone] for further instructions.   Report your blood sugar to the Short-Stay nurse when you get to Short-Stay.  References:  [facility], 2007 "How to Manage your Diabetes Before and After Surgery".  What do I do about my diabetes medications?   Do not take oral diabetes medicines (pills) the morning of surgery.(No metformin day of surgery)   Do not wear jewelry.  Do not wear lotions, powders, or cologne.  You may NOT wear deodorant.  Men may shave face and neck.  Do not bring valuables to the hospital.  [facility] is not responsible for any belongings or valuables.  Contacts, dentures or bridgework may not be worn into surgery.  Leave your suitcase in the car.  After surgery it may be brought to your room.  For patients admitted to the hospital, discharge time will be determined by your treatment team.  Special instructions:  [place] - Preparing for Surgery  Before surgery, you can play an important role.  Because skin is not sterile, your skin needs to be as free of germs as possible.  You can reduce the  number of germs on you skin by washing with CHG (chlorahexidine gluconate) soap before surgery.  CHG is an antiseptic cleaner which kills germs and bonds with the skin to continue killing germs even after washing.  Please DO NOT use if you have an allergy to CHG or antibacterial soaps.  If your skin becomes reddened/irritated stop using the CHG and inform your nurse when you arrive at Short Stay.  Do not shave (including legs and underarms) for at least 48 hours prior to the first CHG shower.  You may shave your face.  Please follow these instructions carefully:   1.   Shower with CHG Soap the night before surgery and the                                morning of Surgery.  2.  If you choose to wash your hair, wash your hair first as usual with your       normal shampoo.  3.  After you shampoo, rinse your hair and body thoroughly to remove the                      Shampoo.  4.  Use CHG as you would any other liquid soap.  You can apply chg directly       to the skin and wash gently with scrungie or a clean washcloth.  5.  Apply the CHG Soap to your body ONLY FROM THE NECK DOWN.        Do not use on open wounds or open sores.  Avoid contact with your eyes, ears, mouth and genitals (private parts).  Wash genitals (private parts) with your normal soap.  6.  Wash thoroughly, paying special attention to the area where your surgery        will be performed.  7.  Thoroughly rinse your body with warm water from the neck down.  8.  DO NOT shower/wash with your normal soap after using and rinsing off       the CHG Soap.  9.  Pat yourself dry with a clean towel.            10.  Wear clean pajamas.            11.  Place clean sheets on your bed the night of your first shower and do not        sleep with pets.  Day of Surgery  Do not apply any lotions/deodorants the morning of surgery.  Please wear clean clothes to the hospital.   Please read over the following fact sheets that you were given. Pain Booklet, Coughing and Deep Breathing, MRSA Information and Surgical Site Infection Prevention

## 2015-06-25 NOTE — Progress Notes (Signed)
Stent done in jan 17.plavix d/c'd starting today 06/25/15 lovenox 100 mg starting tomorrow 06/26/15. Per pt per dr Jacinto Halimganji.note in media dr Jacinto Halimganji.pt to continue aspirin.

## 2015-06-26 LAB — HEMOGLOBIN A1C
Hgb A1c MFr Bld: 6.8 % — ABNORMAL HIGH (ref 4.8–5.6)
Mean Plasma Glucose: 148 mg/dL

## 2015-06-26 NOTE — Progress Notes (Signed)
Anesthesia Chart Review:  Pt is a 70 year old male scheduled for abdominal aortic aneurysm repair on 07/04/2015 with Dr. Darrick PennaFields.   Cardiologist is Dr. Yates DecampJay Ganji who cleared pt at low risk and gave permission to stop plavix for 6 days with lovenox bridging.   PMH includes:  CAD (PTCA and 2 stents to OM1 02/11/15), MI, HTN, DM, stroke, heart murmur, AAA, anemia, COPD, hyperlipidemia, dementia. Former smoker. BMI 30  Medications include: ASA, plavix, lovenox, lasix, razadyne, imdur, memantine, metformin, metoprolol, dulera, rampril, simvastatin, albuterol, zetia. Pt to stop plavix 06/25/15 and start lovenox 06/26/15.   Preoperative labs reviewed.  HgbA1c 6.8, glucose 108  Chest x-ray 12/13/14 reviewed. No acute cardiopulmonary disease  EKG 02/12/15: Sinus bradycardia (47 bpm). RBBB. LAFB. Bifascicular block   Cardiac cath 02/11/15:  1. PTCA and Two overlapping stents 2.75 x 22 mm and a 2.75 x 14 mm resolute integrity stents applied in the large OM1 branch of Circumflex coronary artery, stenosis reduced from 95% to 0% with maintenance of TIMI-3 flow. 2. Although patient has history of angioplasty in 1999, no stents were visualized. There is mild disease in the circumflex and mild scattered disease in the LAD and nondominant RCA. 3. LVEF 45%. Mild inferior hypokinesis, mild global hypokinesis. 4. A total of 2 and 35 mL of contrast was utilized for diagnostic and intervention procedure. 5. Will need Dual antiplatelet therapy with Plavix and ASA 81 mg for at least one year, patient aware to postpone his knee replacement which is an elective procedure.   Echocardiogram12/06/2014:  - Left ventricle cavity is normal in size. Basal inferolateral, Mid inferolateral, Mid inferior and Apical inferior hypokinesis. LVEF mildly depressed and estimated at 45% Moderate concentric hypertrophy of the left ventricle.  - Doppler evidence of grade Left atrial cavity is mildly dilated.  - Moderate aortic regurgitation.   - Mild mitral regurgitation.  Carotid Doppler12/06/2014 Minimal stenosis in the right internal carotid artery (minimal). Diffuse mixed plaque bilateral. Antegrade right vertebral artery flow. Antegrade left vertebral artery flow. No significant chagne since 08/12/2011.  If no changes, I anticipate pt can proceed with surgery as scheduled.   Rica Mastngela Cecil Bixby, FNP-BC [facility]/Anesthesiology Phone: [phone] 06/26/2015 10:15 AM

## 2015-06-27 ENCOUNTER — Ambulatory Visit (HOSPITAL_COMMUNITY): Payer: Medicare Other

## 2015-06-30 MED ORDER — VANCOMYCIN HCL IN DEXTROSE 1-5 GM/200ML-% IV SOLN
1000.0000 mg | INTRAVENOUS | Status: AC
  Administered 2015-07-01: 1000 mg via INTRAVENOUS
  Filled 2015-06-30: qty 200

## 2015-06-30 NOTE — Anesthesia Preprocedure Evaluation (Addendum)
Anesthesia Evaluation  Patient identified by MRN, date of birth, ID band Patient awake  General Assessment Comment:CAD (PTCA and 2 stents to OM1 02/11/15), MI, HTN, DM, stroke, heart murmur, AAA, anemia, COPD, hyperlipidemia, dementia. Former smoker. BMI 30  Reviewed: Allergy & Precautions, NPO status , Patient's Chart, lab work & pertinent test results, reviewed documented beta blocker date and time   History of Anesthesia Complications Negative for: history of anesthetic complications  Airway Mallampati: II  TM Distance: >3 FB Neck ROM: Full    Dental  (+) Dental Advisory Given, Edentulous Upper, Edentulous Lower   Pulmonary asthma , COPD,  COPD inhaler, former smoker,    Pulmonary exam normal breath sounds clear to auscultation       Cardiovascular hypertension, Pt. on medications and Pt. on home beta blockers + angina + CAD, + Past MI, + Cardiac Stents (2 stents to OM1 02/11/15) and + Peripheral Vascular Disease  Normal cardiovascular exam+ Valvular Problems/Murmurs AI and MR  Rhythm:Regular Rate:Normal  EKG 02/12/15: Sinus bradycardia (47 bpm). RBBB. LAFB. Bifascicular block   Cardiac cath 02/11/15:  1. PTCA and Two overlapping stents 2.75 x 22 mm and a 2.75 x 14 mm resolute integrity stents applied in the large OM1 branch of Circumflex coronary artery, stenosis reduced from 95% to 0% with maintenance of TIMI-3 flow. 2. Although patient has history of angioplasty in 1999, no stents were visualized. There is mild disease in the circumflex and mild scattered disease in the LAD and nondominant RCA. 3. LVEF 45%. Mild inferior hypokinesis, mild global hypokinesis. 4. A total of 2 and 35 mL of contrast was utilized for diagnostic and intervention procedure. 5. Will need Dual antiplatelet therapy with Plavix and ASA 81 mg for at least one year, patient aware to postpone his knee replacement which is an elective procedure.    Echocardiogram12/06/2014:  - Left ventricle cavity is normal in size. Basal inferolateral, Mid inferolateral, Mid inferior and Apical inferior hypokinesis. LVEF mildly depressed and estimated at 45% Moderate concentric hypertrophy of the left ventricle.  - Doppler evidence of grade Left atrial cavity is mildly dilated.  - Moderate aortic regurgitation.  - Mild mitral regurgitation.   Neuro/Psych  Headaches, PSYCHIATRIC DISORDERS Anxiety Depression Dementia   Neuromuscular disease CVA    GI/Hepatic negative GI ROS, Neg liver ROS,   Endo/Other  diabetes, Type 2, Oral Hypoglycemic Agents  Renal/GU Renal InsufficiencyRenal disease     Musculoskeletal  (+) Arthritis , Osteoarthritis,    Abdominal   Peds  Hematology  (+) Blood dyscrasia (Plavix, ASA DAPT; Lovenox bridge), ,   Anesthesia Other Findings Day of surgery medications reviewed with the patient.  Reproductive/Obstetrics                      Anesthesia Physical Anesthesia Plan  ASA: IV  Anesthesia Plan: General   Post-op Pain Management:    Induction: Intravenous  Airway Management Planned: Oral ETT  Additional Equipment: Arterial line, CVP and Ultrasound Guidance Line Placement  Intra-op Plan:   Post-operative Plan: Extubation in OR and Possible Post-op intubation/ventilation  Informed Consent: I have reviewed the patients History and Physical, chart, labs and discussed the procedure including the risks, benefits and alternatives for the proposed anesthesia with the patient or authorized representative who has indicated his/her understanding and acceptance.   Dental advisory given and Dental Advisory Given  Plan Discussed with: CRNA and Anesthesiologist  Anesthesia Plan Comments: (Risks/benefits of general anesthesia discussed with patient including risk of damage  to teeth, lips, gum, and tongue, nausea/vomiting, allergic reactions to medications, and the possibility of heart  attack, stroke and death.  All patient questions answered.  Patient wishes to proceed.  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

## 2015-07-01 ENCOUNTER — Inpatient Hospital Stay (HOSPITAL_COMMUNITY)
Admission: RE | Admit: 2015-07-01 | Discharge: 2015-07-26 | DRG: 268 | Disposition: E | Payer: Medicare Other | Source: Ambulatory Visit | Attending: Vascular Surgery | Admitting: Vascular Surgery

## 2015-07-01 ENCOUNTER — Inpatient Hospital Stay (HOSPITAL_COMMUNITY): Payer: Medicare Other | Admitting: Certified Registered Nurse Anesthetist

## 2015-07-01 ENCOUNTER — Inpatient Hospital Stay (HOSPITAL_COMMUNITY): Payer: Medicare Other

## 2015-07-01 ENCOUNTER — Encounter (HOSPITAL_COMMUNITY): Admission: RE | Disposition: E | Payer: Self-pay | Source: Ambulatory Visit | Attending: Vascular Surgery

## 2015-07-01 ENCOUNTER — Encounter (HOSPITAL_COMMUNITY): Payer: Self-pay | Admitting: Surgery

## 2015-07-01 ENCOUNTER — Inpatient Hospital Stay (HOSPITAL_COMMUNITY): Payer: Medicare Other | Admitting: Emergency Medicine

## 2015-07-01 DIAGNOSIS — I129 Hypertensive chronic kidney disease with stage 1 through stage 4 chronic kidney disease, or unspecified chronic kidney disease: Secondary | ICD-10-CM | POA: Diagnosis present

## 2015-07-01 DIAGNOSIS — D62 Acute posthemorrhagic anemia: Secondary | ICD-10-CM | POA: Diagnosis not present

## 2015-07-01 DIAGNOSIS — R230 Cyanosis: Secondary | ICD-10-CM | POA: Diagnosis not present

## 2015-07-01 DIAGNOSIS — J449 Chronic obstructive pulmonary disease, unspecified: Secondary | ICD-10-CM | POA: Diagnosis present

## 2015-07-01 DIAGNOSIS — E1165 Type 2 diabetes mellitus with hyperglycemia: Secondary | ICD-10-CM | POA: Diagnosis present

## 2015-07-01 DIAGNOSIS — Y713 Surgical instruments, materials and cardiovascular devices (including sutures) associated with adverse incidents: Secondary | ICD-10-CM | POA: Diagnosis not present

## 2015-07-01 DIAGNOSIS — E870 Hyperosmolality and hypernatremia: Secondary | ICD-10-CM | POA: Diagnosis not present

## 2015-07-01 DIAGNOSIS — R319 Hematuria, unspecified: Secondary | ICD-10-CM | POA: Diagnosis not present

## 2015-07-01 DIAGNOSIS — R6521 Severe sepsis with septic shock: Secondary | ICD-10-CM | POA: Diagnosis not present

## 2015-07-01 DIAGNOSIS — I472 Ventricular tachycardia: Secondary | ICD-10-CM | POA: Diagnosis not present

## 2015-07-01 DIAGNOSIS — N179 Acute kidney failure, unspecified: Secondary | ICD-10-CM | POA: Diagnosis not present

## 2015-07-01 DIAGNOSIS — E43 Unspecified severe protein-calorie malnutrition: Secondary | ICD-10-CM | POA: Diagnosis not present

## 2015-07-01 DIAGNOSIS — F419 Anxiety disorder, unspecified: Secondary | ICD-10-CM | POA: Diagnosis not present

## 2015-07-01 DIAGNOSIS — D696 Thrombocytopenia, unspecified: Secondary | ICD-10-CM | POA: Diagnosis not present

## 2015-07-01 DIAGNOSIS — J440 Chronic obstructive pulmonary disease with acute lower respiratory infection: Secondary | ICD-10-CM | POA: Diagnosis not present

## 2015-07-01 DIAGNOSIS — E1122 Type 2 diabetes mellitus with diabetic chronic kidney disease: Secondary | ICD-10-CM | POA: Diagnosis present

## 2015-07-01 DIAGNOSIS — Z9911 Dependence on respirator [ventilator] status: Secondary | ICD-10-CM | POA: Diagnosis not present

## 2015-07-01 DIAGNOSIS — Z7984 Long term (current) use of oral hypoglycemic drugs: Secondary | ICD-10-CM | POA: Diagnosis not present

## 2015-07-01 DIAGNOSIS — E871 Hypo-osmolality and hyponatremia: Secondary | ICD-10-CM | POA: Diagnosis not present

## 2015-07-01 DIAGNOSIS — E559 Vitamin D deficiency, unspecified: Secondary | ICD-10-CM | POA: Diagnosis present

## 2015-07-01 DIAGNOSIS — I9751 Accidental puncture and laceration of a circulatory system organ or structure during a circulatory system procedure: Secondary | ICD-10-CM | POA: Diagnosis not present

## 2015-07-01 DIAGNOSIS — Z7902 Long term (current) use of antithrombotics/antiplatelets: Secondary | ICD-10-CM

## 2015-07-01 DIAGNOSIS — Y832 Surgical operation with anastomosis, bypass or graft as the cause of abnormal reaction of the patient, or of later complication, without mention of misadventure at the time of the procedure: Secondary | ICD-10-CM | POA: Diagnosis not present

## 2015-07-01 DIAGNOSIS — Z452 Encounter for adjustment and management of vascular access device: Secondary | ICD-10-CM | POA: Diagnosis not present

## 2015-07-01 DIAGNOSIS — Z87891 Personal history of nicotine dependence: Secondary | ICD-10-CM

## 2015-07-01 DIAGNOSIS — E7800 Pure hypercholesterolemia, unspecified: Secondary | ICD-10-CM | POA: Diagnosis present

## 2015-07-01 DIAGNOSIS — A419 Sepsis, unspecified organism: Secondary | ICD-10-CM | POA: Diagnosis not present

## 2015-07-01 DIAGNOSIS — J9601 Acute respiratory failure with hypoxia: Secondary | ICD-10-CM

## 2015-07-01 DIAGNOSIS — E876 Hypokalemia: Secondary | ICD-10-CM | POA: Diagnosis not present

## 2015-07-01 DIAGNOSIS — Z9081 Acquired absence of spleen: Secondary | ICD-10-CM

## 2015-07-01 DIAGNOSIS — I252 Old myocardial infarction: Secondary | ICD-10-CM

## 2015-07-01 DIAGNOSIS — R410 Disorientation, unspecified: Secondary | ICD-10-CM | POA: Diagnosis not present

## 2015-07-01 DIAGNOSIS — R001 Bradycardia, unspecified: Secondary | ICD-10-CM | POA: Diagnosis not present

## 2015-07-01 DIAGNOSIS — Z955 Presence of coronary angioplasty implant and graft: Secondary | ICD-10-CM

## 2015-07-01 DIAGNOSIS — E669 Obesity, unspecified: Secondary | ICD-10-CM | POA: Diagnosis present

## 2015-07-01 DIAGNOSIS — J810 Acute pulmonary edema: Secondary | ICD-10-CM | POA: Diagnosis not present

## 2015-07-01 DIAGNOSIS — I4891 Unspecified atrial fibrillation: Secondary | ICD-10-CM | POA: Diagnosis not present

## 2015-07-01 DIAGNOSIS — I714 Abdominal aortic aneurysm, without rupture, unspecified: Secondary | ICD-10-CM | POA: Diagnosis present

## 2015-07-01 DIAGNOSIS — E872 Acidosis, unspecified: Secondary | ICD-10-CM | POA: Insufficient documentation

## 2015-07-01 DIAGNOSIS — N183 Chronic kidney disease, stage 3 (moderate): Secondary | ICD-10-CM | POA: Diagnosis present

## 2015-07-01 DIAGNOSIS — Z4659 Encounter for fitting and adjustment of other gastrointestinal appliance and device: Secondary | ICD-10-CM

## 2015-07-01 DIAGNOSIS — J189 Pneumonia, unspecified organism: Secondary | ICD-10-CM | POA: Diagnosis not present

## 2015-07-01 DIAGNOSIS — R040 Epistaxis: Secondary | ICD-10-CM

## 2015-07-01 DIAGNOSIS — F329 Major depressive disorder, single episode, unspecified: Secondary | ICD-10-CM | POA: Diagnosis present

## 2015-07-01 DIAGNOSIS — J9600 Acute respiratory failure, unspecified whether with hypoxia or hypercapnia: Secondary | ICD-10-CM | POA: Diagnosis not present

## 2015-07-01 DIAGNOSIS — R570 Cardiogenic shock: Secondary | ICD-10-CM | POA: Diagnosis not present

## 2015-07-01 DIAGNOSIS — I214 Non-ST elevation (NSTEMI) myocardial infarction: Secondary | ICD-10-CM | POA: Diagnosis not present

## 2015-07-01 DIAGNOSIS — I25119 Atherosclerotic heart disease of native coronary artery with unspecified angina pectoris: Secondary | ICD-10-CM | POA: Diagnosis present

## 2015-07-01 DIAGNOSIS — Z7982 Long term (current) use of aspirin: Secondary | ICD-10-CM | POA: Diagnosis not present

## 2015-07-01 DIAGNOSIS — E874 Mixed disorder of acid-base balance: Secondary | ICD-10-CM | POA: Diagnosis not present

## 2015-07-01 DIAGNOSIS — Z66 Do not resuscitate: Secondary | ICD-10-CM | POA: Diagnosis not present

## 2015-07-01 DIAGNOSIS — Z6829 Body mass index (BMI) 29.0-29.9, adult: Secondary | ICD-10-CM | POA: Diagnosis not present

## 2015-07-01 DIAGNOSIS — R579 Shock, unspecified: Secondary | ICD-10-CM

## 2015-07-01 DIAGNOSIS — R112 Nausea with vomiting, unspecified: Secondary | ICD-10-CM | POA: Diagnosis not present

## 2015-07-01 DIAGNOSIS — T8110XA Postprocedural shock unspecified, initial encounter: Secondary | ICD-10-CM | POA: Diagnosis not present

## 2015-07-01 DIAGNOSIS — Z8673 Personal history of transient ischemic attack (TIA), and cerebral infarction without residual deficits: Secondary | ICD-10-CM

## 2015-07-01 DIAGNOSIS — K913 Postprocedural intestinal obstruction: Secondary | ICD-10-CM | POA: Diagnosis not present

## 2015-07-01 DIAGNOSIS — R0602 Shortness of breath: Secondary | ICD-10-CM

## 2015-07-01 DIAGNOSIS — K567 Ileus, unspecified: Secondary | ICD-10-CM | POA: Diagnosis not present

## 2015-07-01 DIAGNOSIS — G250 Essential tremor: Secondary | ICD-10-CM | POA: Diagnosis present

## 2015-07-01 DIAGNOSIS — N170 Acute kidney failure with tubular necrosis: Secondary | ICD-10-CM | POA: Diagnosis not present

## 2015-07-01 DIAGNOSIS — J9690 Respiratory failure, unspecified, unspecified whether with hypoxia or hypercapnia: Secondary | ICD-10-CM

## 2015-07-01 DIAGNOSIS — E1151 Type 2 diabetes mellitus with diabetic peripheral angiopathy without gangrene: Secondary | ICD-10-CM | POA: Diagnosis present

## 2015-07-01 DIAGNOSIS — E875 Hyperkalemia: Secondary | ICD-10-CM | POA: Diagnosis not present

## 2015-07-01 DIAGNOSIS — Z7951 Long term (current) use of inhaled steroids: Secondary | ICD-10-CM | POA: Diagnosis not present

## 2015-07-01 DIAGNOSIS — Z9889 Other specified postprocedural states: Secondary | ICD-10-CM

## 2015-07-01 DIAGNOSIS — D689 Coagulation defect, unspecified: Secondary | ICD-10-CM | POA: Diagnosis not present

## 2015-07-01 DIAGNOSIS — Z9289 Personal history of other medical treatment: Secondary | ICD-10-CM

## 2015-07-01 DIAGNOSIS — N19 Unspecified kidney failure: Secondary | ICD-10-CM

## 2015-07-01 DIAGNOSIS — L8990 Pressure ulcer of unspecified site, unspecified stage: Secondary | ICD-10-CM | POA: Insufficient documentation

## 2015-07-01 DIAGNOSIS — R0902 Hypoxemia: Secondary | ICD-10-CM | POA: Diagnosis not present

## 2015-07-01 DIAGNOSIS — Z6828 Body mass index (BMI) 28.0-28.9, adult: Secondary | ICD-10-CM

## 2015-07-01 DIAGNOSIS — R578 Other shock: Secondary | ICD-10-CM | POA: Insufficient documentation

## 2015-07-01 DIAGNOSIS — I10 Essential (primary) hypertension: Secondary | ICD-10-CM | POA: Diagnosis not present

## 2015-07-01 DIAGNOSIS — R0603 Acute respiratory distress: Secondary | ICD-10-CM

## 2015-07-01 DIAGNOSIS — F0390 Unspecified dementia without behavioral disturbance: Secondary | ICD-10-CM | POA: Diagnosis present

## 2015-07-01 DIAGNOSIS — Z8679 Personal history of other diseases of the circulatory system: Secondary | ICD-10-CM

## 2015-07-01 HISTORY — PX: EMBOLECTOMY: SHX44

## 2015-07-01 HISTORY — PX: VEIN REPAIR: SHX6524

## 2015-07-01 HISTORY — PX: ABDOMINAL AORTIC ANEURYSM REPAIR: SHX42

## 2015-07-01 LAB — POCT I-STAT 7, (LYTES, BLD GAS, ICA,H+H)
ACID-BASE DEFICIT: 7 mmol/L — AB (ref 0.0–2.0)
ACID-BASE DEFICIT: 4 mmol/L — AB (ref 0.0–2.0)
Acid-base deficit: 2 mmol/L (ref 0.0–2.0)
Acid-base deficit: 8 mmol/L — ABNORMAL HIGH (ref 0.0–2.0)
BICARBONATE: 26.2 meq/L — AB (ref 20.0–24.0)
BICARBONATE: 19 meq/L — AB (ref 20.0–24.0)
Bicarbonate: 22.1 mEq/L (ref 20.0–24.0)
Bicarbonate: 18.4 mEq/L — ABNORMAL LOW (ref 20.0–24.0)
Bicarbonate: 20.1 mEq/L (ref 20.0–24.0)
CALCIUM ION: 1.21 mmol/L (ref 1.13–1.30)
CALCIUM ION: 1.02 mmol/L — AB (ref 1.13–1.30)
CALCIUM ION: 1.14 mmol/L (ref 1.13–1.30)
Calcium, Ion: 1.31 mmol/L — ABNORMAL HIGH (ref 1.13–1.30)
Calcium, Ion: 1.17 mmol/L (ref 1.13–1.30)
HCT: 32 % — ABNORMAL LOW (ref 39.0–52.0)
HEMATOCRIT: 33 % — AB (ref 39.0–52.0)
HEMATOCRIT: 25 % — AB (ref 39.0–52.0)
HEMATOCRIT: 33 % — AB (ref 39.0–52.0)
HEMATOCRIT: 32 % — AB (ref 39.0–52.0)
HEMOGLOBIN: 11.2 g/dL — AB (ref 13.0–17.0)
HEMOGLOBIN: 10.9 g/dL — AB (ref 13.0–17.0)
HEMOGLOBIN: 10.9 g/dL — AB (ref 13.0–17.0)
Hemoglobin: 11.2 g/dL — ABNORMAL LOW (ref 13.0–17.0)
Hemoglobin: 8.5 g/dL — ABNORMAL LOW (ref 13.0–17.0)
O2 SAT: 100 %
O2 SAT: 100 %
O2 SAT: 100 %
O2 Saturation: 100 %
O2 Saturation: 100 %
PCO2 ART: 46 mmHg — AB (ref 35.0–45.0)
PCO2 ART: 33.5 mmHg — AB (ref 35.0–45.0)
PCO2 ART: 34.7 mmHg — AB (ref 35.0–45.0)
PCO2 ART: 41.3 mmHg (ref 35.0–45.0)
PH ART: 7.363 (ref 7.350–7.450)
PH ART: 7.331 — AB (ref 7.350–7.450)
PH ART: 7.266 — AB (ref 7.350–7.450)
PH ART: 7.408 (ref 7.350–7.450)
PO2 ART: 272 mmHg — AB (ref 80.0–100.0)
PO2 ART: 264 mmHg — AB (ref 80.0–100.0)
POTASSIUM: 4 mmol/L (ref 3.5–5.1)
POTASSIUM: 4.6 mmol/L (ref 3.5–5.1)
POTASSIUM: 4.8 mmol/L (ref 3.5–5.1)
Patient temperature: 36.6
Patient temperature: 36.4
Potassium: 4.2 mmol/L (ref 3.5–5.1)
Potassium: 3.8 mmol/L (ref 3.5–5.1)
SODIUM: 142 mmol/L (ref 135–145)
SODIUM: 143 mmol/L (ref 135–145)
Sodium: 142 mmol/L (ref 135–145)
Sodium: 143 mmol/L (ref 135–145)
Sodium: 144 mmol/L (ref 135–145)
TCO2: 28 mmol/L (ref 0–100)
TCO2: 23 mmol/L (ref 0–100)
TCO2: 19 mmol/L (ref 0–100)
TCO2: 20 mmol/L (ref 0–100)
TCO2: 21 mmol/L (ref 0–100)
pCO2 arterial: 31.6 mmHg — ABNORMAL LOW (ref 35.0–45.0)
pH, Arterial: 7.426 (ref 7.350–7.450)
pO2, Arterial: 284 mmHg — ABNORMAL HIGH (ref 80.0–100.0)
pO2, Arterial: 217 mmHg — ABNORMAL HIGH (ref 80.0–100.0)
pO2, Arterial: 315 mmHg — ABNORMAL HIGH (ref 80.0–100.0)

## 2015-07-01 LAB — CBC
HCT: 36.6 % — ABNORMAL LOW (ref 39.0–52.0)
HEMATOCRIT: 30.7 % — AB (ref 39.0–52.0)
Hemoglobin: 11.8 g/dL — ABNORMAL LOW (ref 13.0–17.0)
Hemoglobin: 9.8 g/dL — ABNORMAL LOW (ref 13.0–17.0)
MCH: 31.1 pg (ref 26.0–34.0)
MCH: 30.3 pg (ref 26.0–34.0)
MCHC: 32.2 g/dL (ref 30.0–36.0)
MCHC: 31.9 g/dL (ref 30.0–36.0)
MCV: 96.3 fL (ref 78.0–100.0)
MCV: 95 fL (ref 78.0–100.0)
PLATELETS: 111 10*3/uL — AB (ref 150–400)
PLATELETS: 152 10*3/uL (ref 150–400)
RBC: 3.8 MIL/uL — ABNORMAL LOW (ref 4.22–5.81)
RBC: 3.23 MIL/uL — ABNORMAL LOW (ref 4.22–5.81)
RDW: 15.7 % — ABNORMAL HIGH (ref 11.5–15.5)
RDW: 16 % — AB (ref 11.5–15.5)
WBC: 14.8 10*3/uL — ABNORMAL HIGH (ref 4.0–10.5)
WBC: 12.1 10*3/uL — AB (ref 4.0–10.5)

## 2015-07-01 LAB — BASIC METABOLIC PANEL
Anion gap: 6 (ref 5–15)
BUN: 22 mg/dL — AB (ref 6–20)
CO2: 18 mmol/L — ABNORMAL LOW (ref 22–32)
CREATININE: 1.75 mg/dL — AB (ref 0.61–1.24)
Calcium: 8.1 mg/dL — ABNORMAL LOW (ref 8.9–10.3)
Chloride: 113 mmol/L — ABNORMAL HIGH (ref 101–111)
GFR calc Af Amer: 44 mL/min — ABNORMAL LOW (ref >60)
GFR calc non Af Amer: 38 mL/min — ABNORMAL LOW (ref >60)
GLUCOSE: 214 mg/dL — AB (ref 65–99)
Potassium: 5.8 mmol/L — ABNORMAL HIGH (ref 3.5–5.1)
SODIUM: 137 mmol/L (ref 135–145)

## 2015-07-01 LAB — POCT I-STAT 3, ART BLOOD GAS (G3+)
Acid-base deficit: 4 mmol/L — ABNORMAL HIGH (ref 0.0–2.0)
Bicarbonate: 21 mEq/L (ref 20.0–24.0)
O2 Saturation: 98 %
PCO2 ART: 37.7 mmHg (ref 35.0–45.0)
PH ART: 7.354 (ref 7.350–7.450)
TCO2: 22 mmol/L (ref 0–100)
pO2, Arterial: 101 mmHg — ABNORMAL HIGH (ref 80.0–100.0)

## 2015-07-01 LAB — PROTIME-INR
INR: 1.74 — ABNORMAL HIGH (ref 0.00–1.49)
INR: 1.36 (ref 0.00–1.49)
INR: 1.32 (ref 0.00–1.49)
Prothrombin Time: 20.3 seconds — ABNORMAL HIGH (ref 11.6–15.2)
Prothrombin Time: 16.9 seconds — ABNORMAL HIGH (ref 11.6–15.2)
Prothrombin Time: 16.5 seconds — ABNORMAL HIGH (ref 11.6–15.2)

## 2015-07-01 LAB — GLUCOSE, CAPILLARY
GLUCOSE-CAPILLARY: 102 mg/dL — AB (ref 65–99)
GLUCOSE-CAPILLARY: 188 mg/dL — AB (ref 65–99)
Glucose-Capillary: 210 mg/dL — ABNORMAL HIGH (ref 65–99)

## 2015-07-01 LAB — PREPARE RBC (CROSSMATCH)

## 2015-07-01 LAB — APTT
APTT: 49 s — AB (ref 24–37)
aPTT: >200 seconds (ref 24–37)
aPTT: 86 seconds — ABNORMAL HIGH (ref 24–37)

## 2015-07-01 LAB — PROCALCITONIN: Procalcitonin: 0.9 ng/mL

## 2015-07-01 LAB — MAGNESIUM: MAGNESIUM: 1.7 mg/dL (ref 1.7–2.4)

## 2015-07-01 LAB — FIBRINOGEN: FIBRINOGEN: 180 mg/dL — AB (ref 204–475)

## 2015-07-01 SURGERY — ANEURYSM ABDOMINAL AORTIC REPAIR
Anesthesia: General | Site: Leg Lower

## 2015-07-01 MED ORDER — SODIUM CHLORIDE 0.9 % IV SOLN
INTRAVENOUS | Status: DC | PRN
  Administered 2015-07-01 (×2): via INTRAVENOUS

## 2015-07-01 MED ORDER — ROCURONIUM BROMIDE 50 MG/5ML IV SOLN
INTRAVENOUS | Status: AC
  Filled 2015-07-01: qty 1

## 2015-07-01 MED ORDER — SODIUM CHLORIDE 0.9 % IV SOLN: Freq: Once | INTRAVENOUS | Status: AC

## 2015-07-01 MED ORDER — MORPHINE SULFATE (PF) 2 MG/ML IV SOLN
2.0000 mg | INTRAVENOUS | Status: DC | PRN
  Administered 2015-07-01: 2 mg via INTRAVENOUS
  Filled 2015-07-01: qty 1

## 2015-07-01 MED ORDER — ONDANSETRON HCL 4 MG/2ML IJ SOLN
4.0000 mg | Freq: Four times a day (QID) | INTRAMUSCULAR | Status: DC | PRN
  Administered 2015-07-05 – 2015-07-06 (×2): 4 mg via INTRAVENOUS
  Filled 2015-07-01 (×2): qty 2

## 2015-07-01 MED ORDER — EPHEDRINE 5 MG/ML INJ
INTRAVENOUS | Status: AC
  Filled 2015-07-01: qty 10

## 2015-07-01 MED ORDER — METOPROLOL TARTRATE 5 MG/5ML IV SOLN: 2.0000 mg | INTRAVENOUS | Status: DC | PRN

## 2015-07-01 MED ORDER — SODIUM CHLORIDE 0.9 % IV SOLN: 10.0000 mL/h | Freq: Once | INTRAVENOUS | Status: DC

## 2015-07-01 MED ORDER — PROPOFOL 10 MG/ML IV BOLUS
INTRAVENOUS | Status: AC
  Filled 2015-07-01: qty 40

## 2015-07-01 MED ORDER — SUCCINYLCHOLINE CHLORIDE 200 MG/10ML IV SOSY
PREFILLED_SYRINGE | INTRAVENOUS | Status: AC
Start: 2015-07-01 — End: 2015-07-01
  Filled 2015-07-01: qty 10

## 2015-07-01 MED ORDER — THROMBIN 20000 UNITS EX SOLR
CUTANEOUS | Status: AC
  Filled 2015-07-01: qty 20000

## 2015-07-01 MED ORDER — DEXAMETHASONE SODIUM PHOSPHATE 10 MG/ML IJ SOLN
INTRAMUSCULAR | Status: AC
  Filled 2015-07-01: qty 1

## 2015-07-01 MED ORDER — PHENOL 1.4 % MT LIQD: 1.0000 | OROMUCOSAL | Status: DC | PRN

## 2015-07-01 MED ORDER — ALBUMIN HUMAN 5 % IV SOLN
INTRAVENOUS | Status: DC | PRN
  Administered 2015-07-01 (×3): via INTRAVENOUS

## 2015-07-01 MED ORDER — HEPARIN SODIUM (PORCINE) 1000 UNIT/ML IJ SOLN
INTRAMUSCULAR | Status: AC
  Filled 2015-07-01: qty 1

## 2015-07-01 MED ORDER — MIDAZOLAM HCL 2 MG/2ML IJ SOLN
INTRAMUSCULAR | Status: AC
  Filled 2015-07-01: qty 2

## 2015-07-01 MED ORDER — NOREPINEPHRINE BITARTRATE 1 MG/ML IV SOLN
0.0000 ug/min | INTRAVENOUS | Status: DC
  Administered 2015-07-01: 3 ug/min via INTRAVENOUS
  Administered 2015-07-01: 8 ug/min via INTRAVENOUS
  Filled 2015-07-01 (×4): qty 4

## 2015-07-01 MED ORDER — NOREPINEPHRINE BITARTRATE 1 MG/ML IV SOLN
INTRAVENOUS | Status: DC | PRN
  Administered 2015-07-01 (×2): 1 mL via INTRAVENOUS

## 2015-07-01 MED ORDER — SODIUM CHLORIDE 0.9 % IV SOLN
Freq: Once | INTRAVENOUS | Status: AC
  Administered 2015-07-01: 16:00:00 via INTRAVENOUS

## 2015-07-01 MED ORDER — FENTANYL BOLUS VIA INFUSION
25.0000 ug | INTRAVENOUS | Status: DC | PRN
  Filled 2015-07-01: qty 25

## 2015-07-01 MED ORDER — INSULIN ASPART 100 UNIT/ML ~~LOC~~ SOLN
0.0000 [IU] | Freq: Three times a day (TID) | SUBCUTANEOUS | Status: DC
  Administered 2015-07-01: 5 [IU] via SUBCUTANEOUS

## 2015-07-01 MED ORDER — ANTISEPTIC ORAL RINSE SOLUTION (CORINZ)
7.0000 mL | OROMUCOSAL | Status: DC
  Administered 2015-07-01 – 2015-07-05 (×37): 7 mL via OROMUCOSAL

## 2015-07-01 MED ORDER — CHLORHEXIDINE GLUCONATE CLOTH 2 % EX PADS: 6.0000 | MEDICATED_PAD | Freq: Once | CUTANEOUS | Status: DC

## 2015-07-01 MED ORDER — FENTANYL CITRATE (PF) 100 MCG/2ML IJ SOLN
25.0000 ug | INTRAMUSCULAR | Status: AC | PRN
  Administered 2015-07-02: 50 ug via INTRAVENOUS
  Administered 2015-07-04: 25 ug via INTRAVENOUS
  Administered 2015-07-05 (×4): 50 ug via INTRAVENOUS
  Filled 2015-07-01 (×5): qty 2

## 2015-07-01 MED ORDER — LACTATED RINGERS IV SOLN
INTRAVENOUS | Status: DC | PRN
  Administered 2015-07-01 (×4): via INTRAVENOUS

## 2015-07-01 MED ORDER — HYDRALAZINE HCL 20 MG/ML IJ SOLN
5.0000 mg | INTRAMUSCULAR | Status: AC | PRN
  Administered 2015-07-06 – 2015-07-07 (×2): 5 mg via INTRAVENOUS
  Filled 2015-07-01 (×2): qty 1

## 2015-07-01 MED ORDER — CISATRACURIUM BESYLATE 20 MG/10ML IV SOLN
INTRAVENOUS | Status: AC
  Filled 2015-07-01: qty 30

## 2015-07-01 MED ORDER — ATROPINE SULFATE 1 MG/ML IJ SOLN
INTRAMUSCULAR | Status: AC
  Filled 2015-07-01: qty 1

## 2015-07-01 MED ORDER — DEXMEDETOMIDINE HCL IN NACL 200 MCG/50ML IV SOLN
0.4000 ug/kg/h | Freq: Once | INTRAVENOUS | Status: AC
  Administered 2015-07-01: 0.7 ug/kg/h via INTRAVENOUS
  Filled 2015-07-01: qty 50

## 2015-07-01 MED ORDER — CHLORHEXIDINE GLUCONATE 0.12% ORAL RINSE (MEDLINE KIT)
15.0000 mL | Freq: Two times a day (BID) | OROMUCOSAL | Status: DC
  Administered 2015-07-01 – 2015-07-05 (×8): 15 mL via OROMUCOSAL

## 2015-07-01 MED ORDER — NITROGLYCERIN IN D5W 200-5 MCG/ML-% IV SOLN
0.0000 ug/min | INTRAVENOUS | Status: DC
  Administered 2015-07-01: 5 ug/min via INTRAVENOUS
  Filled 2015-07-01: qty 250

## 2015-07-01 MED ORDER — NOREPINEPHRINE BITARTRATE 1 MG/ML IV SOLN
4000.0000 ug | INTRAVENOUS | Status: DC | PRN
  Administered 2015-07-01: 4 ug/min via INTRAVENOUS

## 2015-07-01 MED ORDER — ONDANSETRON HCL 4 MG/2ML IJ SOLN: 4.0000 mg | Freq: Once | INTRAMUSCULAR | Status: DC | PRN

## 2015-07-01 MED ORDER — ACETAMINOPHEN 325 MG PO TABS: 325.0000 mg | ORAL_TABLET | ORAL | Status: DC | PRN

## 2015-07-01 MED ORDER — DOCUSATE SODIUM 100 MG PO CAPS
100.0000 mg | ORAL_CAPSULE | Freq: Every day | ORAL | Status: DC
  Filled 2015-07-01: qty 1

## 2015-07-01 MED ORDER — VANCOMYCIN HCL 10 G IV SOLR
1500.0000 mg | INTRAVENOUS | Status: DC
  Administered 2015-07-03: 1500 mg via INTRAVENOUS
  Filled 2015-07-01: qty 1500

## 2015-07-01 MED ORDER — HEPARIN SODIUM (PORCINE) 1000 UNIT/ML IJ SOLN
INTRAMUSCULAR | Status: DC | PRN
  Administered 2015-07-01: 10000 [IU] via INTRAVENOUS
  Administered 2015-07-01: 5000 [IU] via INTRAVENOUS
  Administered 2015-07-01: 10000 [IU] via INTRAVENOUS

## 2015-07-01 MED ORDER — GUAIFENESIN-DM 100-10 MG/5ML PO SYRP: 15.0000 mL | ORAL_SOLUTION | ORAL | Status: DC | PRN

## 2015-07-01 MED ORDER — SODIUM CHLORIDE 0.9 % IV SOLN: 500.0000 mL | Freq: Once | INTRAVENOUS | Status: DC | PRN

## 2015-07-01 MED ORDER — DEXTROSE 5 % IV SOLN
10.0000 mg | INTRAVENOUS | Status: DC | PRN
  Administered 2015-07-01: 10 ug/min via INTRAVENOUS

## 2015-07-01 MED ORDER — PROTAMINE SULFATE 10 MG/ML IV SOLN
INTRAVENOUS | Status: AC
  Filled 2015-07-01: qty 5

## 2015-07-01 MED ORDER — NOREPINEPHRINE BITARTRATE 1 MG/ML IV SOLN
0.0000 ug/min | INTRAVENOUS | Status: DC
  Administered 2015-07-01: 3 ug/min via INTRAVENOUS
  Filled 2015-07-01: qty 4

## 2015-07-01 MED ORDER — DEXMEDETOMIDINE HCL IN NACL 200 MCG/50ML IV SOLN
0.4000 ug/kg/h | INTRAVENOUS | Status: DC
  Administered 2015-07-01: 0.6 ug/kg/h via INTRAVENOUS
  Filled 2015-07-01 (×2): qty 50

## 2015-07-01 MED ORDER — INSULIN ASPART 100 UNIT/ML ~~LOC~~ SOLN
0.0000 [IU] | SUBCUTANEOUS | Status: DC
Start: 2015-07-01 — End: 2015-07-06
  Administered 2015-07-01: 3 [IU] via SUBCUTANEOUS
  Administered 2015-07-03 – 2015-07-05 (×6): 2 [IU] via SUBCUTANEOUS
  Administered 2015-07-06: 3 [IU] via SUBCUTANEOUS

## 2015-07-01 MED ORDER — PANTOPRAZOLE SODIUM 40 MG PO TBEC: 40.0000 mg | DELAYED_RELEASE_TABLET | Freq: Every day | ORAL | Status: DC

## 2015-07-01 MED ORDER — SODIUM CHLORIDE 0.9 % IV SOLN: Freq: Once | INTRAVENOUS | Status: DC

## 2015-07-01 MED ORDER — FENTANYL CITRATE (PF) 100 MCG/2ML IJ SOLN
50.0000 ug | Freq: Once | INTRAMUSCULAR | Status: AC
  Administered 2015-07-01: 50 ug via INTRAVENOUS

## 2015-07-01 MED ORDER — SODIUM BICARBONATE 8.4 % IV SOLN
INTRAVENOUS | Status: DC | PRN
  Administered 2015-07-01 (×2): 25 mL via INTRAVENOUS

## 2015-07-01 MED ORDER — LIDOCAINE HCL (CARDIAC) 20 MG/ML IV SOLN
INTRAVENOUS | Status: DC | PRN
  Administered 2015-07-01: 50 mg via INTRAVENOUS

## 2015-07-01 MED ORDER — EPHEDRINE SULFATE 50 MG/ML IJ SOLN
INTRAMUSCULAR | Status: DC | PRN
  Administered 2015-07-01 (×2): 10 mg via INTRAVENOUS
  Administered 2015-07-01: 5 mg via INTRAVENOUS

## 2015-07-01 MED ORDER — CISATRACURIUM BESYLATE (PF) 10 MG/5ML IV SOLN
INTRAVENOUS | Status: DC | PRN
  Administered 2015-07-01: 2 mg via INTRAVENOUS
  Administered 2015-07-01: 4 mg via INTRAVENOUS
  Administered 2015-07-01: 2 mg via INTRAVENOUS
  Administered 2015-07-01: 4 mg via INTRAVENOUS
  Administered 2015-07-01: 8 mg via INTRAVENOUS
  Administered 2015-07-01: 4 mg via INTRAVENOUS
  Administered 2015-07-01: 8 mg via INTRAVENOUS
  Administered 2015-07-01: 4 mg via INTRAVENOUS

## 2015-07-01 MED ORDER — ALBUTEROL SULFATE (2.5 MG/3ML) 0.083% IN NEBU: 3.0000 mL | INHALATION_SOLUTION | Freq: Four times a day (QID) | RESPIRATORY_TRACT | Status: DC | PRN

## 2015-07-01 MED ORDER — SODIUM CHLORIDE 0.9 % IV SOLN: INTRAVENOUS | Status: DC

## 2015-07-01 MED ORDER — FENTANYL CITRATE (PF) 100 MCG/2ML IJ SOLN
INTRAMUSCULAR | Status: DC | PRN
  Administered 2015-07-01: 100 ug via INTRAVENOUS
  Administered 2015-07-01 (×6): 50 ug via INTRAVENOUS
  Administered 2015-07-01: 100 ug via INTRAVENOUS

## 2015-07-01 MED ORDER — SUCCINYLCHOLINE CHLORIDE 20 MG/ML IJ SOLN
INTRAMUSCULAR | Status: DC | PRN
  Administered 2015-07-01: 100 mg via INTRAVENOUS

## 2015-07-01 MED ORDER — 0.9 % SODIUM CHLORIDE (POUR BTL) OPTIME
TOPICAL | Status: DC | PRN
  Administered 2015-07-01: 3000 mL

## 2015-07-01 MED ORDER — SODIUM CHLORIDE 0.9 % IV SOLN
Freq: Once | INTRAVENOUS | Status: AC
  Administered 2015-07-01: 23:00:00 via INTRAVENOUS

## 2015-07-01 MED ORDER — SODIUM CHLORIDE 0.9 % IJ SOLN
INTRAMUSCULAR | Status: AC
  Filled 2015-07-01: qty 10

## 2015-07-01 MED ORDER — DEXAMETHASONE SODIUM PHOSPHATE 10 MG/ML IJ SOLN
INTRAMUSCULAR | Status: DC | PRN
  Administered 2015-07-01: 10 mg via INTRAVENOUS

## 2015-07-01 MED ORDER — ACETAMINOPHEN 325 MG RE SUPP
325.0000 mg | RECTAL | Status: DC | PRN
  Administered 2015-07-13: 650 mg via RECTAL
  Filled 2015-07-01: qty 2

## 2015-07-01 MED ORDER — FENTANYL CITRATE (PF) 250 MCG/5ML IJ SOLN
INTRAMUSCULAR | Status: AC
  Filled 2015-07-01: qty 5

## 2015-07-01 MED ORDER — PROPOFOL 10 MG/ML IV BOLUS
INTRAVENOUS | Status: DC | PRN
  Administered 2015-07-01: 150 mg via INTRAVENOUS

## 2015-07-01 MED ORDER — CALCIUM GLUCONATE 10 % IV SOLN
INTRAVENOUS | Status: DC | PRN
  Administered 2015-07-01: 500 mg via INTRAVENOUS
  Administered 2015-07-01: 1000 mg via INTRAVENOUS

## 2015-07-01 MED ORDER — SODIUM CHLORIDE 0.9 % IV SOLN
25.0000 ug/h | INTRAVENOUS | Status: DC
  Administered 2015-07-01: 50 ug/h via INTRAVENOUS
  Administered 2015-07-02: 375 ug/h via INTRAVENOUS
  Administered 2015-07-02: 350 ug/h via INTRAVENOUS
  Administered 2015-07-02: 375 ug/h via INTRAVENOUS
  Administered 2015-07-02: 400 ug/h via INTRAVENOUS
  Administered 2015-07-03: 300 ug/h via INTRAVENOUS
  Administered 2015-07-03 (×2): 400 ug/h via INTRAVENOUS
  Administered 2015-07-04: 150 ug/h via INTRAVENOUS
  Filled 2015-07-01 (×9): qty 50

## 2015-07-01 MED ORDER — VANCOMYCIN HCL IN DEXTROSE 1-5 GM/200ML-% IV SOLN
1000.0000 mg | Freq: Two times a day (BID) | INTRAVENOUS | Status: AC
  Administered 2015-07-01 – 2015-07-02 (×2): 1000 mg via INTRAVENOUS
  Filled 2015-07-01 (×2): qty 200

## 2015-07-01 MED ORDER — ATROPINE SULFATE 1 MG/ML IJ SOLN
INTRAMUSCULAR | Status: DC | PRN
  Administered 2015-07-01: 0.2 mg via INTRAVENOUS

## 2015-07-01 MED ORDER — INSULIN ASPART 100 UNIT/ML ~~LOC~~ SOLN: 0.0000 [IU] | SUBCUTANEOUS | Status: DC

## 2015-07-01 MED ORDER — SODIUM BICARBONATE 8.4 % IV SOLN
INTRAVENOUS | Status: AC
  Filled 2015-07-01: qty 50

## 2015-07-01 MED ORDER — SODIUM CHLORIDE 0.9 % IV SOLN
10.0000 mL/h | Freq: Once | INTRAVENOUS | Status: AC
  Administered 2015-07-05: 10 mL/h via INTRAVENOUS

## 2015-07-01 MED ORDER — SODIUM CHLORIDE 0.9 % IV SOLN
2.0000 g | INTRAVENOUS | Status: AC
  Administered 2015-07-01: 2 g via INTRAVENOUS
  Filled 2015-07-01: qty 20

## 2015-07-01 MED ORDER — MAGNESIUM SULFATE 2 GM/50ML IV SOLN: 2.0000 g | Freq: Once | INTRAVENOUS | Status: DC | PRN

## 2015-07-01 MED ORDER — ARTIFICIAL TEARS OP OINT
TOPICAL_OINTMENT | OPHTHALMIC | Status: DC | PRN
  Administered 2015-07-01: 1 via OPHTHALMIC

## 2015-07-01 MED ORDER — DEXTROSE-NACL 5-0.45 % IV SOLN: INTRAVENOUS | Status: DC

## 2015-07-01 MED ORDER — HEPARIN SODIUM (PORCINE) 1000 UNIT/ML IJ SOLN
INTRAMUSCULAR | Status: AC
  Filled 2015-07-01: qty 2

## 2015-07-01 MED ORDER — MOMETASONE FURO-FORMOTEROL FUM 100-5 MCG/ACT IN AERO
2.0000 | INHALATION_SPRAY | Freq: Two times a day (BID) | RESPIRATORY_TRACT | Status: DC
  Administered 2015-07-06 – 2015-07-07 (×4): 2 via RESPIRATORY_TRACT
  Filled 2015-07-01 (×2): qty 8.8

## 2015-07-01 MED ORDER — SODIUM CHLORIDE 0.9 % IV SOLN
1.0000 g | Freq: Once | INTRAVENOUS | Status: AC
  Administered 2015-07-01: 1 g via INTRAVENOUS
  Filled 2015-07-01: qty 10

## 2015-07-01 MED ORDER — MANNITOL 25 % IV SOLN
50.0000 g | INTRAVENOUS | Status: AC
  Administered 2015-07-01: 12.5 g via INTRAVENOUS
  Filled 2015-07-01: qty 200

## 2015-07-01 MED ORDER — FAMOTIDINE IN NACL 20-0.9 MG/50ML-% IV SOLN
20.0000 mg | Freq: Two times a day (BID) | INTRAVENOUS | Status: DC
  Administered 2015-07-01 – 2015-07-02 (×4): 20 mg via INTRAVENOUS
  Filled 2015-07-01 (×4): qty 50

## 2015-07-01 MED ORDER — METOPROLOL TARTRATE 5 MG/5ML IV SOLN: 2.5000 mg | Freq: Four times a day (QID) | INTRAVENOUS | Status: DC

## 2015-07-01 MED ORDER — LABETALOL HCL 5 MG/ML IV SOLN: 10.0000 mg | INTRAVENOUS | Status: DC | PRN

## 2015-07-01 MED ORDER — MIDAZOLAM HCL 5 MG/5ML IJ SOLN
INTRAMUSCULAR | Status: DC | PRN
  Administered 2015-07-01: 2 mg via INTRAVENOUS
  Administered 2015-07-01 (×2): 1 mg via INTRAVENOUS

## 2015-07-01 MED ORDER — SODIUM CHLORIDE 0.9 % IV SOLN
INTRAVENOUS | Status: DC | PRN
  Administered 2015-07-01: 500 mL

## 2015-07-01 MED ORDER — SODIUM CHLORIDE 0.9 % IV SOLN
INTRAVENOUS | Status: DC
  Administered 2015-07-02 (×2): via INTRAVENOUS

## 2015-07-01 MED ORDER — LIDOCAINE 2% (20 MG/ML) 5 ML SYRINGE
INTRAMUSCULAR | Status: AC
  Filled 2015-07-01: qty 5

## 2015-07-01 MED ORDER — BISACODYL 10 MG RE SUPP
10.0000 mg | Freq: Every day | RECTAL | Status: DC | PRN
  Administered 2015-07-13: 10 mg via RECTAL
  Filled 2015-07-01 (×2): qty 1

## 2015-07-01 MED ORDER — SODIUM CHLORIDE 0.9 % IV SOLN
Freq: Once | INTRAVENOUS | Status: AC
  Administered 2015-07-01: 17:00:00 via INTRAVENOUS

## 2015-07-01 MED FILL — Sodium Chloride IV Soln 0.9%: INTRAVENOUS | Qty: 4000 | Status: AC

## 2015-07-01 MED FILL — Heparin Sodium (Porcine) Inj 1000 Unit/ML: INTRAMUSCULAR | Qty: 30 | Status: AC

## 2015-07-01 SURGICAL SUPPLY — 68 items
ATTRACTOMAT 16X20 MAGNETIC DRP (DRAPES) ×3 IMPLANT
BAG ISL DRAPE 18X18 STRL (DRAPES) ×6
BAG ISOLATION DRAPE 18X18 (DRAPES) IMPLANT
CANISTER SUCTION 2500CC (MISCELLANEOUS) ×5 IMPLANT
CANNULA VESSEL 3MM 2 BLNT TIP (CANNULA) ×7 IMPLANT
CATH EMB 3FR 80CM (CATHETERS) ×2 IMPLANT
CATH EMB 4FR 80CM (CATHETERS) ×2 IMPLANT
CLIP TI MEDIUM 24 (CLIP) ×5 IMPLANT
CLIP TI WIDE RED SMALL 24 (CLIP) ×5 IMPLANT
DRAPE ISOLATION BAG 18X18 (DRAPES) ×4
DRAPE PROXIMA HALF (DRAPES) ×6 IMPLANT
DRSG COVADERM 4X14 (GAUZE/BANDAGES/DRESSINGS) ×2 IMPLANT
DRSG COVADERM 4X8 (GAUZE/BANDAGES/DRESSINGS) ×2 IMPLANT
ELECT BLADE 4.0 EZ CLEAN MEGAD (MISCELLANEOUS) ×5
ELECT BLADE 6.5 EXT (BLADE) IMPLANT
ELECT REM PT RETURN 9FT ADLT (ELECTROSURGICAL) ×5
ELECTRODE BLDE 4.0 EZ CLN MEGD (MISCELLANEOUS) IMPLANT
ELECTRODE REM PT RTRN 9FT ADLT (ELECTROSURGICAL) ×3 IMPLANT
FELT TEFLON 1X6 (MISCELLANEOUS) ×2 IMPLANT
GAUZE SPONGE 4X4 16PLY XRAY LF (GAUZE/BANDAGES/DRESSINGS) ×2 IMPLANT
GEL ULTRASOUND 20GR AQUASONIC (MISCELLANEOUS) ×2 IMPLANT
GLOVE BIO SURGEON STRL SZ 6.5 (GLOVE) ×2 IMPLANT
GLOVE BIO SURGEON STRL SZ7.5 (GLOVE) ×5 IMPLANT
GLOVE BIO SURGEONS STRL SZ 6.5 (GLOVE) ×2
GLOVE BIOGEL PI IND STRL 6.5 (GLOVE) IMPLANT
GLOVE BIOGEL PI IND STRL 7.0 (GLOVE) IMPLANT
GLOVE BIOGEL PI INDICATOR 6.5 (GLOVE) ×8
GLOVE BIOGEL PI INDICATOR 7.0 (GLOVE) ×2
GLOVE ECLIPSE 6.5 STRL STRAW (GLOVE) ×8 IMPLANT
GLOVE SURG SS PI 6.5 STRL IVOR (GLOVE) ×2 IMPLANT
GOWN STRL REUS W/ TWL LRG LVL3 (GOWN DISPOSABLE) ×9 IMPLANT
GOWN STRL REUS W/TWL LRG LVL3 (GOWN DISPOSABLE) ×20
GRAFT HEMASHIELD 16MM (Vascular Products) ×2 IMPLANT
INSERT FOGARTY 61MM (MISCELLANEOUS) ×10 IMPLANT
INSERT FOGARTY SM (MISCELLANEOUS) ×20 IMPLANT
KIT BASIN OR (CUSTOM PROCEDURE TRAY) ×5 IMPLANT
KIT ROOM TURNOVER OR (KITS) ×5 IMPLANT
LIQUID BAND (GAUZE/BANDAGES/DRESSINGS) ×2 IMPLANT
LOOP VESSEL MINI RED (MISCELLANEOUS) ×4 IMPLANT
NS IRRIG 1000ML POUR BTL (IV SOLUTION) ×10 IMPLANT
PACK AORTA (CUSTOM PROCEDURE TRAY) ×5 IMPLANT
PAD ARMBOARD 7.5X6 YLW CONV (MISCELLANEOUS) ×10 IMPLANT
RETAINER VISCERA MED (MISCELLANEOUS) ×5 IMPLANT
SPONGE LAP 18X18 X RAY DECT (DISPOSABLE) ×2 IMPLANT
SPONGE SURGIFOAM ABS GEL 100 (HEMOSTASIS) IMPLANT
STAPLER VISISTAT 35W (STAPLE) ×10 IMPLANT
SUT PDS AB 1 TP1 54 (SUTURE) ×10 IMPLANT
SUT PROLENE 3 0 SH DA (SUTURE) IMPLANT
SUT PROLENE 3 0 SH1 36 (SUTURE) ×28 IMPLANT
SUT PROLENE 5 0 C 1 36 (SUTURE) ×4 IMPLANT
SUT PROLENE 5 0 CC 1 (SUTURE) ×10 IMPLANT
SUT PROLENE 6 0 C 1 30 (SUTURE) ×3 IMPLANT
SUT SILK 2 0 (SUTURE) ×10
SUT SILK 2 0SH CR/8 30 (SUTURE) ×5 IMPLANT
SUT SILK 2-0 18XBRD TIE 12 (SUTURE) IMPLANT
SUT SILK 3 0 (SUTURE) ×10
SUT SILK 3-0 18XBRD TIE 12 (SUTURE) IMPLANT
SUT SILK 4 0 (SUTURE) ×10
SUT SILK 4-0 18XBRD TIE 12 (SUTURE) IMPLANT
SUT VIC AB 2-0 SH 27 (SUTURE)
SUT VIC AB 2-0 SH 27XBRD (SUTURE) IMPLANT
SUT VIC AB 3-0 SH 27 (SUTURE) ×25
SUT VIC AB 3-0 SH 27X BRD (SUTURE) ×3 IMPLANT
SUT VIC AB 4-0 PS2 27 (SUTURE) ×2 IMPLANT
SYR 3ML LL SCALE MARK (SYRINGE) ×4 IMPLANT
TRAY FOLEY W/METER SILVER 16FR (SET/KITS/TRAYS/PACK) ×5 IMPLANT
WATER STERILE IRR 1000ML POUR (IV SOLUTION) ×8 IMPLANT
YANKAUER SUCT BULB TIP NO VENT (SUCTIONS) ×2 IMPLANT

## 2015-07-01 NOTE — Transfer of Care (Signed)
Immediate Anesthesia Transfer of Care Note  Patient: Wyatt CarrelRalph C Miller  Procedure(s) Performed: Procedure(s): REPAIR OF JUXTARENAL ABDOMINAL AORTIC  ANEURYSM REPAIR USING 16MM HEMASHEILD GRAFT (N/A) REPAIR of inferiorvenacava LEFT POPLITEAL TO TIBIAL ARTERY EMBOLECTOMY  Patient Location: PACU  Anesthesia Type:General  Level of Consciousness: Patient remains intubated per anesthesia plan  Airway & Oxygen Therapy: Patient remains intubated per anesthesia plan and Patient placed on Ventilator (see vital sign flow sheet for setting)  Post-op Assessment: Report given to RN  Post vital signs: Reviewed and stable  Last Vitals:  Filed Vitals:   06/30/2015 0654  BP: 150/87  Pulse: 66  Temp: 36.7 C  Resp: 20    Last Pain: There were no vitals filed for this visit.       Complications: No apparent anesthesia complications

## 2015-07-01 NOTE — Progress Notes (Addendum)
PCCM INTERVAL PROGRESS NOTE  [facility] to [facility] patient for epistaxis. In brief Mr Alysia Pennarvin is a 70 year old gentleman who presented 6/6 for elective AAA repair. Perioperative course complicated by significant blood loss requiring transfusion and vasoactive infusions (which have since been weaned). Since arrival to ICU he has had oozing of blood from nose, CVL site, abdominal incision. His abdomen is also somewhat more distended than it was immediately post-op. Bedside ultrasound of abdomen illicits no obvious dependent fluid in the peritoneal or retroperitoneal spaces. BLE with some mottling. Blood products as ordered by vascular surgery still infusing. 2 units FFP remain to be given.   Plan: Nasal packing to hopefully limit epistaxis HIT panel DIC panel Continue blood product transfusion Will add 1G calcium gluconate Will extend course of vancomycin with nasal packing in place  Additional critical care time 30 minutes  Joneen RoachPaul Shawnna Pancake, AGACNP-BC [facility]/Critical [facility] Pager [phone] or [phone]  07/17/2015 7:17 PM

## 2015-07-01 NOTE — Op Note (Addendum)
Procedure: Repair of juxtarenal abdominal aortic aneurysm (16 mm Dacron graft), repair of inferior vena cava, left popliteal and tibial embolectomy  Preoperative diagnosis: Abdominal aortic aneurysm  Postoperative diagnosis: Same  Anesthesia: Gen.  Assistant: Doreatha MassedSamantha Rhyne PA-C  Operative findings: #116 mm Dacron graft from just below the renal arteries to the aortic bifurcation #2 ligation of inferior mesenteric artery #3 no significant thrombus or embolic material retrieved on popliteal and tibial embolectomy left leg  Operative details: After informed consent, the patient however. The patient's placed in supine position operating table. After induction of general anesthesia and endotracheal ablation, patient was prepped and draped in usual sterile fashion from the nipples to the toes.  Next the midline laparotomy incision was made extending from the xiphoid down the pubis. Subcutaneous tissues taken out cautery. The fascia peritoneum were incised for the full length the incision. The abdomen was inspected and the liver was normal in appearance. The small bowel and colon were also normal in appearance. The gallbladder was normal in appearance. Next the transverse colon and omentum was reflected superiorly the small bowel was reflected the right and an Omni retractor was brought up in the operative field to assist in retraction. Retroperitoneum was entered by making an incision just medial to the area of the duodenum over the aorta. Due to the patient's obesity dissection was fairly tedious in a fairly deep hole. Retroperitoneum was opened although at the level of the left renal vein. This is fully mobilized on its undersurface. The left and right renal arteries were identified. Vessel loops were placed around both of these. The aortic neck was freed up circumferentially. There was minimal neck just below the renal arteries so I prepared a site just above the renal arteries for clamping. Dissection  was then carried down to level the inferior mesenteric artery. Vessel loop was placed around this. Dissection was then carried down to the proximal common iliac arteries bilaterally. These were dissected free circumferentially and umbilical tape placed around them. Patient was then given 10,000 units of intravenous heparin. It was also given an additional 5000 units of heparin and 10,000 units of heparin during the course of the case. After appropriate circulation time left and right common iliac arteries were controlled with angled coarct clamps. The aorta was clamped just above the renal arteries and renal arteries controlled with vessel loops. The IMA was controlled with a vessel loop as well. Longitudinal opening was made in the abdominal aortic aneurysm and the aneurysm contents removed. Several lumbar arteries were ligated with figure-of-eight silk sutures. A good neck of healthy aortic material was prepared and a 16 mm Dacron graft was brought up in the operative field and sewn in end to this using a running 3-0 Prolene suture. At completion anastomosis it was tested there was some loosening of the suture laterally this was tightened and 2 repair sutures were also placed. This was then hemostatic. I then prepared the distals section of the aorta for grafting. However, on dissecting out the wall of the distal aorta I inadvertently made a laceration in the inferior vena cava. There was significant bleeding from this until it can be controlled proximally and distally with sponge sticks. I then repaired the hole in the vena cava with 3 interrupted 3-0 Prolene pledgeted sutures. After hemostasis was obtained in this area attention was turned back to the distal aorta. The graft was cut to length and sewn end-to-end to the distal aorta using a running 3-0 Prolene suture. Prior completion anastomosis  was forebled backbled and thoroughly flushed anastomosis was secured clamps first release restoring flow to the left  leg. There was a transient pressure dropped which then returned after administration of fluid. In similar fashion the right iliac was unclamped and again there was a brief transient pressure dropped followed by rise in pressure. Unfortunately both feet had no Doppler signals in them. Attention was turned back to the abdominal aorta. There was a good pulse within the common iliac arteries and within the femoral artery bilaterally. I proceeded to go ahead and obtain hemostasis in the abdomen. The retroperitoneum and aortic sac closed with a running 3-0 Vicryl suture. The Omni retractor was removed and the viscera returned to normal position. The fascia was then reapproximated using a running #1 PDS suture. The skin was closed with staples. Attention was then turned back on the feet. I could still obtain a Doppler signals in these. Therefore I decided to do an embolectomy of the left leg. A longitudinal opening was made in the left medial leg just below the knee. Incision was carried out through the saphenous tissues down level of fascia. The fascia was opened and the below-knee popliteal space was entered. Popliteal artery was dissected free circumferentially. This was a large good quality artery and there is an excellent pulse within it. I then proceeded to dissect out the anterior tibial and anterior tibial peroneal trunk and vessel loops placed around these. The patient was given 10000 units of heparin. Transverse arteriotomy was made in the popliteal artery just above the takeoff the tibial vessels. On opening this it was good flow in the popliteal artery. This was controlled with a vessel loop. Then passed a #3 Fogarty catheter down the anterior tibial arteries tibioperoneal trunk multiple times was retrieved and I was able to pass the catheter for the full length of the artery. This point is suspected spasm was the cause of the lack of Doppler signals in the feet and I elected not to expose the right leg. #3  Fogarty catheter was passed for its full length. Proximal popliteal artery and there was brisk arterial inflow. The arteriotomy was reapproximated with a running 6-0 Prolene suture. A completion anastomosis was forebled backbled and thoroughly flushed. Anastomosis was secured Vesseloops released there was a faint monophasic dorsalis pedis in both feet at this point. Hemostasis was obtained in the below-knee popliteal incision. The saphenous tissues reapproximated using a running 3-0 Vicryl suture. The skin was closed with a 4-0 Vicryl subcuticular stitch. The patient tolerated procedure well but was cold and slightly coagulopathic at the end of the procedure. He was taken the intensive care unit in stable but overall critical condition. The instrument sponge and needle counts were correct at the end of the case.  Fabienne Bruns, MD [facility] Office: [phone] Pager: [phone]

## 2015-07-01 NOTE — Progress Notes (Addendum)
Pt with palpable DP pulses bilaterally Profuse nosebleed and hematuria most likely coagulopathic will pack nose PT PTT pending but will transfuse FFP Wean levophed  Fabienne Brunsharles Fields

## 2015-07-01 NOTE — Progress Notes (Signed)
eLink Physician-Brief Progress Note Patient Name: Wyatt CarrelRalph C Miller DOB: 05/18/1945 MRN: [id number]   Date of Service  07/19/2015  HPI/Events of Note  Multiple issues: 1. Norepinephrine IV infusion hanging - no orders and 2. Intubated and Ventilated on AC/HS moderate Novolog SSI.  eICU Interventions  Will order: 1. Norepinephrine IV infusion. Titrate to MAP >= 65. 2. Change to moderate Novolog SSI Q 4 hours.      Intervention Category Major Interventions: Hypotension - evaluation and management;Hyperglycemia - active titration of insulin therapy  Ariaunna Longsworth Dennard Nipugene 06/26/2015, 8:25 PM

## 2015-07-01 NOTE — H&P (View-Only) (Signed)
HISTORY AND PHYSICAL     CC:  Discussion for AAA repair Referring Provider:  Pearson Grippe, MD  HPI: This is a 70 y.o. male who has been followed for many years by Dr. Darrick Penna for AAA.  He did have a cardiac catheterization in January requiring cardiac stents and is now on Plavix and aspirin daily.  He was seen in February with CTA after his duplex revealed an increase in size from 4.8cm to 5.2cm.  The CTA revealed a 5.7cm AAA.  Unfortunately, the pt is not a candidate for stent graft repair and is here today for discussion for open repair.  He has done well since the last visit.  He does have an occasional abdominal pain, but this is not constant.     He does take a beta blocker and ACEI for blood pressure management.  He is on metformin for diabetes.  He does take a statin for cholesterol management.  He is on Plavix and aspirin daily.   Past Medical History  Diagnosis Date  . Hypertension   . Coronary artery disease   . AAA (abdominal aortic aneurysm) (HCC)   . High cholesterol   . Depression   . Anxiety   . Essential tremor 08/18/2012  . Mild cognitive impairment with memory loss 08/18/2012  . CAD (coronary artery disease)   . Dementia   . Asthma   . Vitamin D deficiency   . H. pylori infection   . COPD (chronic obstructive pulmonary disease) (HCC)   . Anemia 2016  . Heart murmur     slight  . Myocardial infarction (HCC)     "I've had 3-4; last one was maybe 2006" (02/11/2015)  . Anginal pain (HCC)   . Pneumonia and influenza   . Pneumonia     "2-3 times" (02/11/2015)  . Type II diabetes mellitus (HCC)     Metformin  . Pyloric spasm 1970s-1980s  . Migraine     "havent' had one in years" (02/11/2015)  . Stroke [facility])     "Dr. Jacinto Halim suspects I've had a mild stroke or 2" (02/11/2015)  . Arthritis     "knees" (02/11/2015)    Past Surgical History  Procedure Laterality Date  . Tonsillectomy  1964  . Appendectomy  1962  . Splenectomy  1952  . Coronary angioplasty with stent  placement    . Cardiac catheterization N/A 02/11/2015    Procedure: Left Heart Cath and Coronary Angiography;  Surgeon: Yates Decamp, MD;  Location: [facility] INVASIVE CV LAB;  Service: Cardiovascular;  Laterality: N/A;     Current Outpatient Prescriptions  Medication Sig Dispense Refill  . aspirin 81 MG chewable tablet Chew 1 tablet (81 mg total) by mouth daily.    Marland Kitchen b complex vitamins tablet Take 1 tablet by mouth daily. (Patient taking differently: Take 1 tablet by mouth 2 (two) times daily. ) 90 tablet 3  . cholecalciferol (VITAMIN D) 1000 UNITS tablet Take 5,000 Units by mouth daily.    . clopidogrel (PLAVIX) 75 MG tablet Take 1 tablet (75 mg total) by mouth daily. 90 tablet 3  . Cyanocobalamin (VITAMIN B 12 PO) Take 1,000 mcg by mouth 2 (two) times daily.     . DULERA 200-5 MCG/ACT AERO Inhale 2 puffs into the lungs Twice daily.     . fexofenadine (ALLEGRA) 180 MG tablet Take 1 tablet (180 mg total) by mouth daily. 90 tablet 3  . fish oil-omega-3 fatty acids 1000 MG capsule Take 1 g by mouth 2 (  two) times daily.    . fluticasone (FLONASE) 50 MCG/ACT nasal spray Place 1 spray into both nostrils 2 (two) times daily.     . furosemide (LASIX) 40 MG tablet Take 40 mg by mouth daily.    Marland Kitchen. galantamine (RAZADYNE ER) 16 MG 24 hr capsule Take 1 capsule (16 mg total) by mouth daily with breakfast. 90 capsule 3  . galantamine (RAZADYNE ER) 16 MG 24 hr capsule Take 1 capsule (16 mg total) by mouth daily. 90 capsule 3  . isosorbide mononitrate (IMDUR) 30 MG 24 hr tablet Take 30 mg by mouth daily.    . memantine (NAMENDA) 10 MG tablet Take 1 tablet (10 mg total) by mouth 2 (two) times daily. 180 tablet 3  . metFORMIN (GLUCOPHAGE) 500 MG tablet Take 1 tablet (500 mg total) by mouth daily.  11  . metoprolol succinate (TOPROL-XL) 25 MG 24 hr tablet Take 12.5 mg by mouth daily.    . nitroGLYCERIN (NITROLINGUAL) 0.4 MG/SPRAY spray Place 1 spray under the tongue every 5 (five) minutes as needed. For chest pain    .  ramipril (ALTACE) 10 MG capsule Take 10 mg by mouth daily.    . simvastatin (ZOCOR) 40 MG tablet Take 40 mg by mouth daily.    Marland Kitchen. venlafaxine XR (EFFEXOR-XR) 75 MG 24 hr capsule Take 75 mg by mouth 2 (two) times daily.     . VENTOLIN HFA 108 (90 BASE) MCG/ACT inhaler Inhale 2 puffs into the lungs every 6 (six) hours as needed for wheezing or shortness of breath.   12  . vitamin C (ASCORBIC ACID) 500 MG tablet Take 500 mg by mouth 2 (two) times daily.    . Vitamin D, Ergocalciferol, (DRISDOL) 50000 UNITS CAPS capsule Take 50,000 Units by mouth every 7 (seven) days.    . vitamin E 200 UNIT capsule Take 200 Units by mouth 2 (two) times daily.    Marland Kitchen. ZETIA 10 MG tablet Take 10 mg by mouth daily.   4   No current facility-administered medications for this visit.    Family History  Problem Relation Age of Onset  . Cancer Paternal Grandmother   . Cirrhosis Mother     Social History   Social History  . Marital Status: Divorced    Spouse Name: N/A  . Number of Children: 1  . Years of Education: BS   Occupational History  . retired    Social History Main Topics  . Smoking status: Former Smoker -- 1.00 packs/day for 50 years    Types: Cigarettes    Quit date: 07/09/2014  . Smokeless tobacco: Never Used  . 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 Drug Use: No  . Sexual Activity: Not Currently   Other Topics Concern  . Not on file   Social History Narrative   Patient is divorced.   Patient has one child.   Patient is retired.   Patient has a BS degree in LobbyistComputer Science.   Patient is right-handed.   Patient drinks soda, coffee and tea- about 3-4 cups daily.           REVIEW OF SYSTEMS:   [X]  denotes positive finding, [ ]  denotes negative finding Cardiac  Comments:  Chest pain or chest pressure:    Shortness of breath upon exertion:  Short of breath when lying flat:    Irregular heart rhythm:        Vascular    Pain in calf, thigh, or hip brought on by ambulation:    Pain in feet at night that wakes you up from your sleep:     Blood clot in your veins:    Leg swelling:         Pulmonary    Oxygen at home:    Productive cough:     Wheezing:         Neurologic    Sudden weakness in arms or legs:     Sudden numbness in arms or legs:     Sudden onset of difficulty speaking or slurred speech:    Temporary loss of vision in one eye:     Problems with dizziness:         Gastrointestinal    Blood in stool:     Vomited blood:         Genitourinary    Burning when urinating:     Blood in urine:        Psychiatric    Major depression:         Hematologic    Bleeding problems:    Problems with blood clotting too easily:        Skin    Rashes or ulcers:        Constitutional    Fever or chills:      PHYSICAL EXAMINATION:  Filed Vitals:   06/05/15 1429  BP: 126/77  Pulse: 69   Body mass index is 29.83 kg/(m^2).  General:  WDWN in NAD; vital signs documented above Gait: Not observed HENT: WNL, normocephalic Pulmonary: normal non-labored breathing , without Rales, rhonchi,  wheezing Cardiac: regular HR, without  Murmurs, rubs or gallops; without carotid bruits Abdomen: soft, NT, no masses Skin: without rashes Vascular Exam/Pulses:  Right Left  Radial 2+ (normal) 2+ (normal)  Femoral 2+ (normal) 2+ (normal)  DP 2+ (normal) 2+ (normal)  PT 2+ (normal) Unable to palpate    Extremities: without ischemic changes, without Gangrene , without cellulitis; without open wounds;  Musculoskeletal: no muscle wasting or atrophy  Neurologic: A&O X 3;  No focal weakness or paresthesias are detected Psychiatric:  The pt has Normal affect.   Non-Invasive Vascular Imaging:   None today  CTA 02/26/15: IMPRESSION: 5.7 cm infrarenal fusiform atherosclerotic abdominal aortic aneurysm, previously measuring  5.2 cm  AAA duplex 01/30/15: Mild increase in the maximum diameter of the AAA when compared to previous exam (5.2cm)  Pt meds includes: Statin:  Yes.   Beta Blocker:  Yes.   Aspirin:  Yes.   ACEI:  Yes.   ARB:  No. Other Antiplatelet/Anticoagulant:  Yes.   Plavix   ASSESSMENT/PLAN:: 70 y.o. male with AAA, which has increased in size and in need of repair and here for further discussion for surgery   -Dr. Darrick PennaFields reviewed the pt's CTA today and the pt is not a candidate for endograft repair and will need open repair.  There is a possibility that he may need to clamp above the renals, which puts the pt at a slightly higher risk for renal failure.   -there is <5% mortality rate -the pt is on Plavix for his cardiac stents that were placed in January by Dr. Jacinto HalimGanji.  He will need to be contacted for discussion on discontinuing Plavix prior to surgery and keeping just the aspirin.   -will plan  to schedule this the first week of June -all of pt's questions are answered by Dr. Darrick Penna as well as risks and benefits of the surgery.    Doreatha Massed, PA-C [facility] [phone]  Clinic MD:  Pt seen and examined in conjunction with Dr. Darrick Penna   History and exam details as above. Patient needs repair of infrarenal abdominal aortic aneurysm to prevent rupture. Current aneurysm diameter 5.7 cm. Review of his CT scan shows that he will most likely need suprarenal clamping and open repair. We had delayed his aneurysm repair for several months until he can be on single antiplatelet agent therapy with aspirin alone due to his previous coronary disease. We have tentatively scheduled him to have his aneurysm repair on 2015/07/03. Risks benefits possible consultations and procedure details including but not limited to leading risk of transfusion 20%, infection 1% myocardial events 5% death less than 5% renal failure less than 5% patient understands and agrees to proceed. I will touch base with  Dr. Nadara Eaton to make sure it is okay to stop his Plavix 10 days prior to his abdominal aortic aneurysm repair. We can then restart this postoperatively.  Fabienne Bruns, MD [facility] [place] Office: [phone] Pager: [phone]

## 2015-07-01 NOTE — Progress Notes (Signed)
eLink Physician-Brief Progress Note Patient Name: [name] DOB: 10/05/1945 MRN: [id number]   Date of Service  06/30/2015  HPI/Events of Note  Labs from 9:10 PM >> Hgb = 9.8, Platelets = 152K, INR = 1.32 and PTT = 49.  eICU Interventions  Will transfuse another 2 units of FFP now.      Intervention Category Intermediate Interventions: Coagulopathy - evaluation and management  Sommer,Steven Eugene 06/26/2015, 10:01 PM

## 2015-07-01 NOTE — Progress Notes (Signed)
Pharmacy Antibiotic Note  Wyatt CarrelRalph C Pring is a 70 y.o. male admitted on 07/02/2015 for a planned abdominal aortic aneurysm repair. The patient received Vancomycin pre-op and post-op. Pharmacy has been consulted to continue Vancomycin dosing while nasal packing remain in place from profuse nosebleed.   The patient received a post-op dose of Vancomycin around 1900 this evening and has another dose scheduled for 0700 on 6/7 AM.   Plan: 1. Start Vancomycin 1500 mg IV every 24 hours on 6/8 AM 2. Consider d/c of prophylactic Vancomycin once nasal packing removed and bleeding controlled 3. Will continue to follow renal function, culture results, LOT, and antibiotic de-escalation plans   Height: 5\' 11"  (180.3 cm) Weight: 213 lb (96.616 kg) IBW/kg (Calculated) : 75.3  Temp (24hrs), Avg:97.4 F (36.3 C), Min:96.5 F (35.8 C), Max:98.2 F (36.8 C)   Recent Labs Lab 06/25/15 1338 22-Oct-2015 1530  WBC 6.5 14.8*  CREATININE 1.32* 1.75*    Estimated Creatinine Clearance: 46.6 mL/min (by C-G formula based on Cr of 1.75).    Antimicrobials this admission: Vanc 6/6 >>  Dose adjustments this admission: n/a  Microbiology results: 5/31 MRSA PCR: negative  Thank you for allowing pharmacy to be a part of this patient's care.  Georgina PillionElizabeth Agapita Savarino, PharmD, BCPS Clinical Pharmacist Pager: [phone] 07/19/2015 7:35 PM

## 2015-07-01 NOTE — Interval H&P Note (Signed)
History and Physical Interval Note:  07/05/2015 7:35 AM  [name]  has presented today for surgery, with the diagnosis of Abdominal aortic aneurysm I71.4  The various methods of treatment have been discussed with the patient and family. After consideration of risks, benefits and other options for treatment, the patient has consented to  Procedure(s): ANEURYSM ABDOMINAL AORTIC REPAIR (N/A) as a surgical intervention .  The patient's history has been reviewed, patient examined, no change in status, stable for surgery.  I have reviewed the patient's chart and labs.  Questions were answered to the patient's satisfaction.     Fabienne BrunsFields, Charles

## 2015-07-01 NOTE — Anesthesia Procedure Notes (Addendum)
Central Venous Catheter Insertion Performed by: anesthesiologist Patient location: Pre-op. Preanesthetic checklist: patient identified, IV checked, site marked, risks and benefits discussed, surgical consent, monitors and equipment checked, pre-op evaluation, timeout performed and anesthesia consent Position: Trendelenburg Lidocaine 1% used for infiltration Landmarks identified Catheter size: 8 Fr Central line was placed.Double lumen Procedure performed using ultrasound guided technique. Attempts: 1 Following insertion, dressing applied, line sutured and Biopatch. Post procedure assessment: blood return through all ports and free fluid flow. Patient tolerated the procedure well with no immediate complications.   Procedure Name: Intubation Date/Time: 07/09/2015 7:56 AM Performed by: Carmela RimaMARTINELLI, Ocie Tino F Pre-anesthesia Checklist: Patient identified, Emergency Drugs available, Suction available and Patient being monitored Patient Re-evaluated:Patient Re-evaluated prior to inductionOxygen Delivery Method: Circle System Utilized Preoxygenation: Pre-oxygenation with 100% oxygen Intubation Type: IV induction Ventilation: Mask ventilation without difficulty Laryngoscope Size: Mac and 4 Grade View: Grade II Tube type: Oral Number of attempts: 1 Airway Equipment and Method: Stylet and Oral airway Placement Confirmation: ETT inserted through vocal cords under direct vision,  positive ETCO2 and breath sounds checked- equal and bilateral Secured at: 22 cm Tube secured with: Tape Dental Injury: Teeth and Oropharynx as per pre-operative assessment

## 2015-07-01 NOTE — Consult Note (Signed)
PULMONARY / CRITICAL CARE MEDICINE   Name: [name] MRN: [id number] DOB: 1945/04/03    ADMISSION DATE:  2015-07-14 CONSULTATION DATE:  07/14/2015  REFERRING MD:  Dr. Darrick Penna  CHIEF COMPLAINT:  Vent Management   HISTORY OF PRESENT ILLNESS:   This is a 70 y.o. Male with a past medical history of Hypertension, CAD, AAA, High Cholesterol, Depression, Anxiety, Essential Tumor, Dementia, Asthma, COPD, former smoker, Anemia, MI, Pneumonia, and Stroke.  He was seen in February with CTA after his duplex revealed an increase in size from 4.8 cm to 5.2 cm.  The CTA revealed a 5.7 AAA.  On 5/11 he was told he was not a candidate for stent graft repair however he was a candidate for open repair.  He was taken to the OR on 6/6 for open AAA repair.  PCCM consulted on 6/6 for vent management  PAST MEDICAL HISTORY :  He  has a past medical history of Hypertension; Coronary artery disease; AAA (abdominal aortic aneurysm) (HCC); High cholesterol; Depression; Anxiety; Essential tremor (08/18/2012); Mild cognitive impairment with memory loss (08/18/2012); CAD (coronary artery disease); Dementia; Asthma; Vitamin D deficiency; H. pylori infection; COPD (chronic obstructive pulmonary disease) (HCC); Anemia (2016); Heart murmur; Myocardial infarction (HCC); Anginal pain (HCC); Pneumonia and influenza; Pneumonia; Type II diabetes mellitus (HCC); Pyloric spasm (1970s-1980s); Arthritis; Stroke [facility]); and Migraine.  PAST SURGICAL HISTORY: He  has past surgical history that includes Tonsillectomy (1964); Appendectomy (1962); Splenectomy (1952); Cardiac catheterization (N/A, 02/11/2015); and Coronary angioplasty with stent (1/17).  No current facility-administered medications on file prior to encounter.   Current Outpatient Prescriptions on File Prior to Encounter  Medication Sig  . clopidogrel (PLAVIX) 75 MG tablet Take 1 tablet (75 mg total) by mouth daily.  . Cyanocobalamin (VITAMIN B 12 PO) Take 1,000 mcg by mouth 2  (two) times daily.   . fexofenadine (ALLEGRA) 180 MG tablet Take 1 tablet (180 mg total) by mouth daily.  . fish oil-omega-3 fatty acids 1000 MG capsule Take 1 g by mouth 2 (two) times daily.  . fluticasone (FLONASE) 50 MCG/ACT nasal spray Place 1 spray into both nostrils 2 (two) times daily.   . furosemide (LASIX) 40 MG tablet Take 40 mg by mouth every morning.   . galantamine (RAZADYNE ER) 16 MG 24 hr capsule Take 1 capsule (16 mg total) by mouth daily with breakfast.  . galantamine (RAZADYNE ER) 16 MG 24 hr capsule Take 1 capsule (16 mg total) by mouth daily. (Patient taking differently: Take 16 mg by mouth every morning. )  . isosorbide mononitrate (IMDUR) 30 MG 24 hr tablet Take 30 mg by mouth every morning.   . memantine (NAMENDA) 10 MG tablet Take 1 tablet (10 mg total) by mouth 2 (two) times daily.  . metFORMIN (GLUCOPHAGE) 500 MG tablet Take 1 tablet (500 mg total) by mouth daily. (Patient taking differently: Take 500 mg by mouth daily with breakfast. )  . metoprolol succinate (TOPROL-XL) 25 MG 24 hr tablet Take 12.5 mg by mouth daily.  . nitroGLYCERIN (NITROLINGUAL) 0.4 MG/SPRAY spray Place 1 spray under the tongue every 5 (five) minutes as needed. For chest pain  . ramipril (ALTACE) 10 MG capsule Take 10 mg by mouth daily.  . simvastatin (ZOCOR) 40 MG tablet Take 40 mg by mouth daily at 6 PM.   . venlafaxine XR (EFFEXOR-XR) 75 MG 24 hr capsule Take 75 mg by mouth 2 (two) times daily.   . VENTOLIN HFA 108 (90 BASE) MCG/ACT inhaler Inhale 2  puffs into the lungs every 6 (six) hours as needed for wheezing or shortness of breath.   . vitamin C (ASCORBIC ACID) 500 MG tablet Take 500 mg by mouth 2 (two) times daily.  . Vitamin D, Ergocalciferol, (DRISDOL) 50000 UNITS CAPS capsule Take 50,000 Units by mouth every Monday.   . vitamin E 200 UNIT capsule Take 200 Units by mouth 2 (two) times daily.  Marland Kitchen. ZETIA 10 MG tablet Take 10 mg by mouth every morning.     FAMILY HISTORY:  His indicated that  his mother is deceased. He indicated that his father is deceased. He indicated that his paternal grandmother is deceased.   SOCIAL HISTORY: He  reports that he quit smoking about a year ago. His smoking use included Cigarettes. He has a 50 pack-year smoking history. He has never used smokeless tobacco. He reports that he drinks alcohol. He reports that he does not use illicit drugs.  REVIEW OF SYSTEMS:   Unable to assess pt intubated  SUBJECTIVE:  Pt intubated and sedated with propofol requiring low dose levophed to maintain map >65  VITAL SIGNS: BP 150/87 mmHg  Pulse 66  Temp(Src) 98.1 F (36.7 C) (Oral)  Resp 20  Ht 5\' 11"  (1.803 m)  Wt 213 lb (96.616 kg)  BMI 29.72 kg/m2  SpO2 96%  HEMODYNAMICS:    VENTILATOR SETTINGS:    INTAKE / OUTPUT:    PHYSICAL EXAMINATION: General:  Ill appearing male intubated Neuro:  Sedated does not follow commands  HEENT:  ETT, NG tube in place, supple, no JVD Cardiovascular:  S1s2, sinus brady Lungs:  Coarse throughout, even, nonlabored Abdomen:  Faint bowel sounds, midline abdominal incision dressing dry and intact Musculoskeletal:  Normal bulk Skin:  Midline abdominal incision dressing dry and intact, left medial thigh incision well approximated dermabond intact  LABS:  BMET  Recent Labs Lab 06/25/15 1338 07/21/2015 0809 07/25/2015 1018  NA 140 142 142  K 4.1 4.2 4.0  CL 109  --   --   CO2 21*  --   --   BUN 12  --   --   CREATININE 1.32*  --   --   GLUCOSE 108*  --   --     Electrolytes  Recent Labs Lab 06/25/15 1338  CALCIUM 8.9    CBC  Recent Labs Lab 06/25/15 1338 07/18/2015 0809 06/28/2015 1018  WBC 6.5  --   --   HGB 13.0 11.2* 8.5*  HCT 41.0 33.0* 25.0*  PLT 205  --   --     Coag's  Recent Labs Lab 06/25/15 1338  APTT 27  INR 1.03    Sepsis Markers No results for input(s): LATICACIDVEN, PROCALCITON, O2SATVEN in the last 168 hours.  ABG  Recent Labs Lab 06/25/15 1339 07/22/2015 0809  06/29/2015 1018  PHART 7.439 7.363 7.426  PCO2ART 32.9* 46.0* 33.5*  PO2ART 78.1* 284.0* 217.0*    Liver Enzymes  Recent Labs Lab 06/25/15 1338  AST 27  ALT 34  ALKPHOS 56  BILITOT 0.6  ALBUMIN 3.8    Cardiac Enzymes No results for input(s): TROPONINI, PROBNP in the last 168 hours.  Glucose  Recent Labs Lab 06/25/15 1302  0632  GLUCAP 103* 102*    Imaging No results found.   STUDIES:  None  CULTURES: None  ANTIBIOTICS: Vancomycin 6/6>>  SIGNIFICANT EVENTS: 6/6-Pt hd open AAA repair remain intubated post surgery   LINES/TUBES: RIJ dual lumen CVC 6/6>> L radial aline 6/6>> PIV x1>>  DISCUSSION:  On 5/11 he was told he was not a candidate for stent graft repair however he was a candidate for open repair.  He was taken to the OR on 6/6 for open AAA repair.  PCCM consulted on 6/6 for vent management  ASSESSMENT / PLAN:  PULMONARY A: Mechanically intubated post surgery Hx: COPD, former smoker  P:   Full vent support Vent Bundle Trend ABG's  CXR in am  CARDIOVASCULAR A: Hypotension, AAA open repair, Sinus Bradycardia Hx: MI, Stroke, Heart Murmur, Anginal Pain, CAD, HTN P:  Vascular consulted appreciate input Titrate Levophed drip to maintain map >65 Telemetry monitoring  Consider changing sedation given his bradycardia   RENAL A:   Acute Kidney injury P:   Trend BMP's Monitor UOP Replace electrolytes as indicated   GASTROINTESTINAL A:   Hx: Pyloric Spasms P:   Keep NPO for now PPI for PUP  HEMATOLOGIC A:   Anemia, hematuria, Profuse nosebleed (nose packed per Vascular Surgery) P:  Trend CBC's daily Obtain PTT, PT, and INR Transfuse FFP and pRBC's per Vascular Surgery recommendations Monitor s/sx for bleeding  INFECTIOUS A:   No problems identified P:   Trend WBC's and monitor fever curve Trend procalcitonin's    ENDOCRINE A:   Type II DM P:   CBG monitoring q4hrs SSI Hyper/Hypoglycemic protocol as  indicated   NEUROLOGIC A:  Pain Hx: Depression, Anxiety, Essential tremor, Dementia P:   RASS goal: -1 Prn Morphine for pain  Precedex drip to maintain RASS goal > if brady continues then consider change to alternative PRN Fentanyl to maintain RASS -1   FAMILY  - Updates: no family at bedside to update  - Inter-disciplinary family meet or Palliative Care meeting due by: July 08, 2015   Sonda Rumble, [place]  Pulmonary/Critical Care   Attending Note:  I have examined patient, reviewed labs, studies and notes. I have discussed the case with D Blakeney, and I agree with the data and plans as amended above. 70 yo man with COPD, hx CVA, now s/p open AAA repair. He returns to the ICU intubated and sedated. He has mild shock currently requiring low dose norepi. On my exam he is agitated, moves frequently. He has clear breath sounds, blood being suctioned from his GT. Nares are packed w gauze and his abdominal dressing is oozing as well. Coags are sent and pending. CXR is clear, ETT in good position.We will add fentanyl for pain control, continue precedex for now but consider changing to an alternative given his bradycardia. Will assess him for WUA and spontaneous breathing in am 6/7. Follow CBC closely.  Independent critical care time is 60  minutes.   Levy Pupa, MD, PhD 2015/07/22, 4:20 PM [facility] [phone] or if no answer [phone]

## 2015-07-01 NOTE — Progress Notes (Signed)
RT no ventilator changes at this time.

## 2015-07-01 NOTE — Brief Op Note (Signed)
07/09/2015  2:12 PM  PATIENT:  [name]  70 y.o. male  PRE-OPERATIVE DIAGNOSIS:  Abdominal aortic aneurysm I71.4  POST-OPERATIVE DIAGNOSIS:  Abdominal aortic aneurysm I71.4  PROCEDURE:  Procedure(s): REPAIR OF JUXTARENAL ABDOMINAL AORTIC  ANEURYSM REPAIR USING 16MM HEMASHEILD GRAFT (N/A) REPAIR of inferiorvenacava LEFT POPLITEAL TO TIBIAL ARTERY EMBOLECTOMY Suprarenal clamp  SURGEON:  Surgeon(s) and Role:    * Sherren Kernsharles E Wyatt Walth, MD - Primary  PHYSICIAN ASSISTANT: Samantha Rhyne   EBL:  Total I/O In: 8540 [I.V.:5250; Blood:2540; IV Piggyback:750] Out: 4850 [Urine:250; Blood:4600]  Delay start of Pharmacological VTE agent (>24hrs) due to surgical blood loss or risk of bleeding

## 2015-07-01 NOTE — Progress Notes (Addendum)
  Day of Surgery Note    Subjective:  intubated  Filed Vitals:   07/03/2015 0654  BP: 150/87  Pulse: 66  Temp: 98.1 F (36.7 C)  Resp: 20    Extremities:  Easily palpable DP pulses bilaterally; bilateral feet are warm. Cardiac:  regular Abdomen:  Soft; non distended   Assessment/Plan:  This is a 70 y.o. 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  Nasal packing has been ordered from the OR -urine in foley is also blood tinged -awaiting post operative labs -pt on Levophed-wean as tolerated -pulmonary/CCM for vent management.   Doreatha MassedSamantha Kaylon Laroche, PA-C 07/11/2015 2:51 PM

## 2015-07-02 ENCOUNTER — Inpatient Hospital Stay (HOSPITAL_COMMUNITY): Payer: Medicare Other

## 2015-07-02 ENCOUNTER — Encounter (HOSPITAL_COMMUNITY): Payer: Self-pay | Admitting: Vascular Surgery

## 2015-07-02 DIAGNOSIS — N170 Acute kidney failure with tubular necrosis: Secondary | ICD-10-CM

## 2015-07-02 DIAGNOSIS — D689 Coagulation defect, unspecified: Secondary | ICD-10-CM

## 2015-07-02 LAB — GLUCOSE, CAPILLARY
GLUCOSE-CAPILLARY: 103 mg/dL — AB (ref 65–99)
GLUCOSE-CAPILLARY: 103 mg/dL — AB (ref 65–99)
GLUCOSE-CAPILLARY: 86 mg/dL (ref 65–99)
GLUCOSE-CAPILLARY: 86 mg/dL (ref 65–99)
Glucose-Capillary: 185 mg/dL — ABNORMAL HIGH (ref 65–99)
Glucose-Capillary: 173 mg/dL — ABNORMAL HIGH (ref 65–99)
Glucose-Capillary: 94 mg/dL (ref 65–99)
Glucose-Capillary: 93 mg/dL (ref 65–99)

## 2015-07-02 LAB — PREPARE PLATELET PHERESIS
UNIT DIVISION: 0
Unit division: 0

## 2015-07-02 LAB — POCT I-STAT 3, ART BLOOD GAS (G3+)
ACID-BASE DEFICIT: 3 mmol/L — AB (ref 0.0–2.0)
Bicarbonate: 22.2 mEq/L (ref 20.0–24.0)
O2 SAT: 93 %
PCO2 ART: 38 mmHg (ref 35.0–45.0)
PO2 ART: 71 mmHg — AB (ref 80.0–100.0)
Patient temperature: 99.5
TCO2: 23 mmol/L (ref 0–100)
pH, Arterial: 7.376 (ref 7.350–7.450)

## 2015-07-02 LAB — COMPREHENSIVE METABOLIC PANEL
ALK PHOS: 35 U/L — AB (ref 38–126)
ALT: 145 U/L — AB (ref 17–63)
AST: 190 U/L — AB (ref 15–41)
Albumin: 2.7 g/dL — ABNORMAL LOW (ref 3.5–5.0)
Anion gap: 7 (ref 5–15)
BILIRUBIN TOTAL: 0.6 mg/dL (ref 0.3–1.2)
BUN: 33 mg/dL — AB (ref 6–20)
CALCIUM: 8.6 mg/dL — AB (ref 8.9–10.3)
CHLORIDE: 112 mmol/L — AB (ref 101–111)
CO2: 22 mmol/L (ref 22–32)
CREATININE: 2.46 mg/dL — AB (ref 0.61–1.24)
GFR, EST AFRICAN AMERICAN: 29 mL/min — AB (ref >60)
GFR, EST NON AFRICAN AMERICAN: 25 mL/min — AB (ref >60)
Glucose, Bld: 131 mg/dL — ABNORMAL HIGH (ref 65–99)
Potassium: 5.5 mmol/L — ABNORMAL HIGH (ref 3.5–5.1)
Sodium: 141 mmol/L (ref 135–145)
TOTAL PROTEIN: 4.8 g/dL — AB (ref 6.5–8.1)

## 2015-07-02 LAB — BLOOD GAS, ARTERIAL
Acid-base deficit: 6 mmol/L — ABNORMAL HIGH (ref 0.0–2.0)
BICARBONATE: 20 meq/L (ref 20.0–24.0)
FIO2: 0.5
O2 SAT: 97.4 %
PEEP: 5 cmH2O
PH ART: 7.253 — AB (ref 7.350–7.450)
Patient temperature: 98.6
RATE: 14 resp/min
TCO2: 21.4 mmol/L (ref 0–100)
VT: 600 mL
pCO2 arterial: 46.8 mmHg — ABNORMAL HIGH (ref 35.0–45.0)
pO2, Arterial: 115 mmHg — ABNORMAL HIGH (ref 80.0–100.0)

## 2015-07-02 LAB — URINALYSIS, ROUTINE W REFLEX MICROSCOPIC
GLUCOSE, UA: NEGATIVE mg/dL
KETONES UR: 15 mg/dL — AB
LEUKOCYTES UA: NEGATIVE
Nitrite: NEGATIVE
PROTEIN: 100 mg/dL — AB
Specific Gravity, Urine: 1.026 (ref 1.005–1.030)
pH: 5 (ref 5.0–8.0)

## 2015-07-02 LAB — URINE MICROSCOPIC-ADD ON

## 2015-07-02 LAB — PREPARE FRESH FROZEN PLASMA
UNIT DIVISION: 0
UNIT DIVISION: 0
UNIT DIVISION: 0
Unit division: 0

## 2015-07-02 LAB — BASIC METABOLIC PANEL
ANION GAP: 7 (ref 5–15)
BUN: 42 mg/dL — ABNORMAL HIGH (ref 6–20)
CHLORIDE: 111 mmol/L (ref 101–111)
CO2: 22 mmol/L (ref 22–32)
Calcium: 7.8 mg/dL — ABNORMAL LOW (ref 8.9–10.3)
Creatinine, Ser: 3.11 mg/dL — ABNORMAL HIGH (ref 0.61–1.24)
GFR calc non Af Amer: 19 mL/min — ABNORMAL LOW (ref >60)
GFR, EST AFRICAN AMERICAN: 22 mL/min — AB (ref >60)
GLUCOSE: 120 mg/dL — AB (ref 65–99)
Potassium: 4.5 mmol/L (ref 3.5–5.1)
Sodium: 140 mmol/L (ref 135–145)

## 2015-07-02 LAB — CBC
HEMATOCRIT: 28.9 % — AB (ref 39.0–52.0)
HEMOGLOBIN: 9.2 g/dL — AB (ref 13.0–17.0)
MCH: 30.7 pg (ref 26.0–34.0)
MCHC: 31.8 g/dL (ref 30.0–36.0)
MCV: 96.3 fL (ref 78.0–100.0)
Platelets: 144 10*3/uL — ABNORMAL LOW (ref 150–400)
RBC: 3 MIL/uL — AB (ref 4.22–5.81)
RDW: 16.2 % — ABNORMAL HIGH (ref 11.5–15.5)
WBC: 12.1 10*3/uL — AB (ref 4.0–10.5)

## 2015-07-02 LAB — SODIUM, URINE, RANDOM: Sodium, Ur: 26 mmol/L

## 2015-07-02 LAB — DIC (DISSEMINATED INTRAVASCULAR COAGULATION)PANEL
INR: 1.17 (ref 0.00–1.49)
Platelets: 132 10*3/uL — ABNORMAL LOW (ref 150–400)

## 2015-07-02 LAB — DIC (DISSEMINATED INTRAVASCULAR COAGULATION) PANEL
APTT: 26 s (ref 24–37)
D DIMER QUANT: 2.21 ug{FEU}/mL — AB (ref 0.00–0.50)
FIBRINOGEN: 367 mg/dL (ref 204–475)
PROTHROMBIN TIME: 15 s (ref 11.6–15.2)
SMEAR REVIEW: NONE SEEN

## 2015-07-02 LAB — PROCALCITONIN: PROCALCITONIN: 1.48 ng/mL

## 2015-07-02 LAB — AMYLASE: AMYLASE: 50 U/L (ref 28–100)

## 2015-07-02 LAB — OSMOLALITY, URINE: OSMOLALITY UR: 368 mosm/kg (ref 300–900)

## 2015-07-02 LAB — MAGNESIUM: MAGNESIUM: 1.7 mg/dL (ref 1.7–2.4)

## 2015-07-02 MED ORDER — SODIUM BICARBONATE 8.4 % IV SOLN
50.0000 meq | Freq: Once | INTRAVENOUS | Status: AC
  Administered 2015-07-02: 50 meq via INTRAVENOUS
  Filled 2015-07-02: qty 50

## 2015-07-02 MED ORDER — DEXTROSE 50 % IV SOLN
50.0000 mL | Freq: Once | INTRAVENOUS | Status: AC
  Administered 2015-07-02: 50 mL via INTRAVENOUS
  Filled 2015-07-02: qty 50

## 2015-07-02 MED ORDER — FUROSEMIDE 10 MG/ML IJ SOLN
160.0000 mg | Freq: Three times a day (TID) | INTRAVENOUS | Status: DC
  Administered 2015-07-02 – 2015-07-03 (×3): 160 mg via INTRAVENOUS
  Filled 2015-07-02 (×4): qty 16

## 2015-07-02 MED ORDER — MIDAZOLAM HCL 2 MG/2ML IJ SOLN
1.0000 mg | INTRAMUSCULAR | Status: DC | PRN
  Administered 2015-07-02 (×2): 1 mg via INTRAVENOUS
  Filled 2015-07-02 (×3): qty 2

## 2015-07-02 MED ORDER — INSULIN REGULAR HUMAN 100 UNIT/ML IJ SOLN
10.0000 [IU] | Freq: Once | INTRAMUSCULAR | Status: AC
  Administered 2015-07-02: 10 [IU] via INTRAVENOUS
  Filled 2015-07-02 (×2): qty 0.1

## 2015-07-02 MED ORDER — FENTANYL BOLUS VIA INFUSION
25.0000 ug | INTRAVENOUS | Status: DC | PRN
  Administered 2015-07-02 – 2015-07-03 (×3): 50 ug via INTRAVENOUS
  Filled 2015-07-02: qty 50

## 2015-07-02 MED ORDER — SODIUM CHLORIDE 0.9 % IV BOLUS (SEPSIS)
500.0000 mL | Freq: Once | INTRAVENOUS | Status: AC
  Administered 2015-07-02: 500 mL via INTRAVENOUS

## 2015-07-02 MED ORDER — MIDAZOLAM HCL 2 MG/2ML IJ SOLN
1.0000 mg | INTRAMUSCULAR | Status: DC | PRN
  Administered 2015-07-03 (×2): 1 mg via INTRAVENOUS
  Filled 2015-07-02 (×3): qty 2

## 2015-07-02 MED ORDER — MIDAZOLAM HCL 2 MG/2ML IJ SOLN
1.0000 mg | INTRAMUSCULAR | Status: DC | PRN
  Administered 2015-07-02 – 2015-07-05 (×8): 1 mg via INTRAVENOUS
  Filled 2015-07-02 (×6): qty 2

## 2015-07-02 NOTE — Progress Notes (Signed)
07/02/2015 6:43 PM Nursing note Pt. Noted to have 10 beat run of Vtach on monitor. Pt. BP did drop with run, with immediate resumption of NSR 63. BP 117/51 (67). Will continue to closely monitor patient.  Rolondo Pierre, Blanchard KelchStephanie Ingold

## 2015-07-02 NOTE — Progress Notes (Signed)
Initial Nutrition Assessment  DOCUMENTATION CODES:   Not applicable  INTERVENTION:    Recommend nutrition support initiation in next 24-48 hours (EN vs TPN)  NUTRITION DIAGNOSIS:   Inadequate oral intake related to inability to eat as evidenced by NPO status  GOAL:   Provide needs based on ASPEN/SCCM guidelines  MONITOR:   Vent status, Labs, Weight trends, I & O's  REASON FOR ASSESSMENT:   Ventilator, Low Braden  ASSESSMENT:   70 yo Male admitted 06/30/15 for elective repair of AAA. The aorta was clamped above the renal arteries and post op he has had issues with hypotension with SBP in the 70's. Pre-OP Scr 06/25/15 was 1.32, 6/617 Scr was 1.75 and today 2.46. UO only 560cc yest and only 35cc so far today. Hx of HTN for >3323yrs and DM x 7-1357yrs per wife. CVP 20. Bp has improved now and is in low 100's on levophed gtt.  Patient s/p procedure 6/6: REPAIR OF JUXTARENAL ABDOMINAL AORTIC ANEURYSM REPAIR OF INFERIOR VENA CAVA LEFT POPLITEAL AND TIBIAL EMBOLECTOMY  Patient is currently intubated on ventilator support Temp (24hrs), Avg:98.5 F (36.9 C), Min:96.5 F (35.8 C), Max:100.1 F (37.8 C)   NGT in place >> had nosebleed (packed). No nutrition problems identified PTA. Spoke with RN >> pt may need CVVHD. No muscle or subcutaneous fat depletion noticed.  Diet Order:  Diet NPO time specified  Skin:  Reviewed, no issues  Last BM:  6/6  Height:   Ht Readings from Last 1 Encounters:  06/30/2015 5\' 11"  (1.803 m)    Weight:   Wt Readings from Last 1 Encounters:  07/02/15 224 lb 13.9 oz (102 kg)    Ideal Body Weight:  78 kg  BMI:  Body mass index is 31.38 kg/(m^2).  Estimated Nutritional Needs:   Kcal:  [id number]  Protein:  150-160 gm  Fluid:  per MD  EDUCATION NEEDS:   No education needs identified at this time  Maureen ChattersKatie Deaveon Schoen, RD, LDN Pager #: [phone] After-Hours Pager #: [phone]

## 2015-07-02 NOTE — Progress Notes (Signed)
PULMONARY / CRITICAL CARE MEDICINE   Name: Wyatt CarrelRalph C Tiano MRN: [id number] DOB: 03/05/1945    ADMISSION DATE:  07/23/2015 CONSULTATION DATE:  07/19/2015  REFERRING MD:  Dr. Darrick PennaFields  CHIEF COMPLAINT:  Vent Management   HISTORY OF PRESENT ILLNESS:   This is a 70 y.o. Male with a past medical history of Hypertension, CAD, AAA, High Cholesterol, Depression, Anxiety, Essential Tumor, Dementia, Asthma, COPD, former smoker, Anemia, MI, Pneumonia, and Stroke.  He was seen in February with CTA after his duplex revealed an increase in size from 4.8 cm to 5.2 cm.  The CTA revealed a 5.7 AAA.  On 5/11 he was told he was not a candidate for stent graft repair however he was a candidate for open repair.  He was taken to the OR on 6/6 for open AAA repair.  PCCM consulted on 6/6 for vent management  SUBJECTIVE:  Pt intubated and sedated  requiring low dose levophed to maintain map >65  VITAL SIGNS: BP 79/59 mmHg  Pulse 56  Temp(Src) 99.4 F (37.4 C) (Axillary)  Resp 14  Ht 5\' 11"  (1.803 m)  Wt 224 lb 13.9 oz (102 kg)  BMI 31.38 kg/m2  SpO2 100%  HEMODYNAMICS: CVP:  [13 mmHg-22 mmHg] 22 mmHg  VENTILATOR SETTINGS: Vent Mode:  [-] PRVC FiO2 (%):  [50 %] 50 % Set Rate:  [14 bmp] 14 bmp Vt Set:  [600 mL] 600 mL PEEP:  [5 cmH20] 5 cmH20 Plateau Pressure:  [22 cmH20-32 cmH20] 31 cmH20  INTAKE / OUTPUT: I/O last 3 completed shifts: In: 16031.5 [I.V.:9706.5; Blood:3955; Other:240; NG/GT:150; IV Piggyback:1980] Out: 5605 [Urine:560; Emesis/NG output:365; Other:80; Blood:4600]  PHYSICAL EXAMINATION: General:  Ill appearing male intubated Neuro:  Sedated does not follow commands  HEENT:  ETT, NG tube in place, supple, no JVD, bilateral bloody nasal packing Cardiovascular:  S1s2, sinus r Lungs:  Coarse throughout, even, nonl abored, on vent Abdomen:No bowel sounds, midline abdominal incision dressing dry and intact. Abd distended Musculoskeletal:  Normal bulk Skin:  Midline abdominal incision  dressing dry and intact, left medial thigh incision well approximated dermabond intact  LABS:  BMET  Recent Labs Lab 06/25/15 1338  06/27/2015 1322 07/19/2015 1530 07/02/15 0400  NA 140  < > 143 137 141  K 4.1  < > 4.8 5.8* 5.5*  CL 109  --   --  113* 112*  CO2 21*  --   --  18* 22  BUN 12  --   --  22* 33*  CREATININE 1.32*  --   --  1.75* 2.46*  GLUCOSE 108*  --   --  214* 131*  < > = values in this interval not displayed.  Electrolytes  Recent Labs Lab 06/25/15 1338 07/12/2015 1530 07/02/15 0400  CALCIUM 8.9 8.1* 8.6*  MG  --  1.7 1.7    CBC  Recent Labs Lab 07/24/2015 1530 07/17/2015 2110 07/02/15 0400  WBC 14.8* 12.1* 12.1*  HGB 11.8* 9.8* 9.2*  HCT 36.6* 30.7* 28.9*  PLT 111* 152 144*    Coag's  Recent Labs Lab 06/28/2015 1530 07/20/2015 1830 07/23/2015 2110  APTT >200* 86* 49*  INR 1.74* 1.36 1.32    Sepsis Markers  Recent Labs Lab 07/18/2015 2110 07/02/15 0400  PROCALCITON 0.90 1.48    ABG  Recent Labs Lab 07/19/2015 1322 07/19/2015 1517 07/02/15 0356  PHART 7.408 7.354 7.253*  PCO2ART 31.6* 37.7 46.8*  PO2ART 315.0* 101.0* 115*    Liver Enzymes  Recent Labs Lab 06/25/15 1338  07/02/15 0400  AST 27 190*  ALT 34 145*  ALKPHOS 56 35*  BILITOT 0.6 0.6  ALBUMIN 3.8 2.7*    Cardiac Enzymes No results for input(s): TROPONINI, PROBNP in the last 168 hours.  Glucose  Recent Labs Lab 06/25/15 1302 07/05/2015 0632 07/14/2015 1636  1948 07/02/15 0007 07/02/15 0410  GLUCAP 103* 102* 210* 188* 103* 94    Imaging Dg Chest Port 1 View  07/02/2015  CLINICAL DATA:  Post abdominal aortic aneurysm repair EXAM: PORTABLE CHEST 1 VIEW COMPARISON:  07/04/2015 FINDINGS: Cardiomegaly again noted. Stable endotracheal and NG tube position. Stable right IJ central line position. There is right base medially platelike atelectasis or early infiltrate. Left basilar atelectasis. No pulmonary edema. IMPRESSION: Cardiomegaly. Stable support apparatus.  Right base medially platelike atelectasis or early infiltrate. Left basilar atelectasis. No pulmonary edema. Electronically Signed   By: Natasha Mead M.D.   On: 07/02/2015 08:14   Dg Chest Port 1 View  06/26/2015  CLINICAL DATA:  Postoperative radiograph.  Central line placement. EXAM: PORTABLE CHEST 1 VIEW COMPARISON:  12/13/2014 FINDINGS: Patient is intubated. Endotracheal tube terminates at the level of the carina. Retraction by 2-4 cm is recommended. Enteric catheter is seen overlying the expected location of gastric body. Right internal jugular approach central venous catheter terminates within the expected location of superior vena cava. Cardiomediastinal silhouette is normal. Mediastinal contours appear intact. There is no evidence of focal airspace consolidation, pleural effusion or pneumothorax. Osseous structures are without acute abnormality. Soft tissues are grossly normal. IMPRESSION: Endotracheal tube at the level of the carina. Retraction by 2-4 cm is recommended. Right internal jugular approach central venous catheter terminates within the expected location of superior vena cava. No evidence of active cardiopulmonary disease, or pneumothorax. Electronically Signed   By: Ted Mcalpine M.D.   On: 06/30/2015 15:05   Dg Abd Portable 1v  07/12/2015  CLINICAL DATA:  Nasogastric tube placement EXAM: PORTABLE ABDOMEN - 1 VIEW COMPARISON:  None. FINDINGS: Nasogastric tube is not appreciable on current image. Bowel gas pattern is unremarkable. There are skin staples to the left of midline. IMPRESSION: Nasogastric tube not appreciable.  Bowel gas pattern unremarkable. These results will be called to the ordering clinician or representative by the Radiologist Assistant, and communication documented in the PACS or zVision Dashboard. Electronically Signed   By: Bretta Bang III M.D.   On: 07/19/2015 15:02     STUDIES:  None  CULTURES: None  ANTIBIOTICS: Vancomycin 6/6>>  SIGNIFICANT  EVENTS: 6/6-Pt hd open AAA repair remains intubated post surgery. Course c/b coagulopathy (likely dilutional), acute renal injury, shock.   LINES/TUBES: RIJ dual lumen CVC 6/6>> L radial aline 6/6>> PIV x1>> ETT 6/6>>  DISCUSSION: On 5/11 he was told he was not a candidate for stent graft repair however he was a candidate for open repair.  He was taken to the OR on 6/6 for open AAA repair.  PCCM consulted on 6/6 for vent management, shock.   ASSESSMENT / PLAN:  PULMONARY A: Mechanically intubated post surgery Hx: COPD, former smoker  P:   Full vent support, increase rr 6/7 to help compensate for metabolic acidosis Vent Bundle Trend ABG's  CXR  No SBT on 6/7 given hemodynamic and metabolic instability  CARDIOVASCULAR A: Hypotension, AAA open repair, Sinus Bradycardia Hx: MI, Stroke, Heart Murmur, Anginal Pain, CAD, HTN P:  Vascular surgery follwing Titrate Levophed drip to maintain map >65 Telemetry monitoring   RENAL Lab Results  Component Value Date  CREATININE 2.46* 07/02/2015   CREATININE 1.75* 07-05-15   CREATININE 1.32* 06/25/2015    Recent Labs Lab July 05, 2015 1322 2015/07/05 1530 07/02/15 0400  K 4.8 5.8* 5.5*   A:   Acute Renal failure, ATN Hyperkalemia  P:   Trend BMP's Monitor UOP CVP 22; suspect we can back off on IVF. Consider diuresis Replace electrolytes as indicated 6/7 renal US for completeness May need CVVH if K+ continues to rise D50/10 units insulin x 1 Increase rr on vent to decrease acidosis to shift K+  GASTROINTESTINAL A:   Hx: Pyloric Spasms P:   Keep NPO for now PPI for PUP  HEMATOLOGIC  Recent Labs  July 05, 2015 2110 07/02/15 0400  HGB 9.8* 9.2*   Lab Results  Component Value Date   INR 1.32 07-05-2015   INR 1.36 07-05-15   INR 1.74* 05-Jul-2015    A:   Anemia, hematuria, Profuse nosebleed (nose packed per Vascular Surgery) P:  Trend CBC's, coags Transfuse FFP and pRBC's per Vascular Surgery  recommendations Monitor s/sx for bleeding Leave nasal packing in for now  INFECTIOUS A:   No problems identified P:   Vancomycin post-op prophylaxis, will need to renal dose Trend WBC's 14->12.1 procalcitonin 1.48   ENDOCRINE CBG (last 3)   Recent Labs  07/05/2015 1948 07/02/15 0007 07/02/15 0410  GLUCAP 188* 103* 94   A:   Type II DM P:   CBG monitoring q4hrs SSI Hyper/Hypoglycemic protocol as indicated   NEUROLOGIC A:  Pain Hx: Depression, Anxiety, Essential tremor, Dementia P:   RASS goal: -1 Prn Morphine for pain  Precedex off Fentanyl drip PRN Fentanyl to maintain RASS -1   FAMILY  - Updates: Dr Delton Coombes updated pt's wife at bedside 6/7.   - Inter-disciplinary family meet or Palliative Care meeting due by: July 08, 2015  Brett Canales Minor ACNP Adolph Pollack PCCM Pager [phone] till 3 pm If no answer page [phone] 07/02/2015, 9:03 AM  Attending Note:  I have examined patient, reviewed labs, studies and notes. I have discussed the case with S Minor, and I agree with the data and plans as amended above. 70 yo man with COPD, hx CVA, now s/p open AAA repair 6/6. He remains intubated, has post-op shock and evidence for evolving ATN + hyperkalemia. On my eval he is comfortable on fentanyl gtt, precedex was stopped. Nasal packing has old blood and I do not see any evidence for persistent active bleeding. Lungs remains clear. Norepi is at 6. ABG shows persistent multifactorial metabolic acidosis. We will increase his Ve. Appreciate renal consultation - note plans to attempt active diuresis in setting CVP 22.  I will defer any SBT today given his hemodynamic and metabolic instability. Hopefully pursue SBT 6/8 depending on overall progress. Independent critical care time is 36 minutes.   Levy Pupa, MD, PhD 07/02/2015, 12:10 PM [facility] [phone] or if no answer [phone]

## 2015-07-02 NOTE — Anesthesia Postprocedure Evaluation (Signed)
Anesthesia Post Note  Patient: [name]  Procedure(s) Performed: Procedure(s) (LRB): REPAIR OF JUXTARENAL ABDOMINAL AORTIC  ANEURYSM REPAIR USING 16MM HEMASHEILD GRAFT (N/A) REPAIR of inferiorvenacava LEFT POPLITEAL TO TIBIAL ARTERY EMBOLECTOMY  Patient location during evaluation: SICU Anesthesia Type: General Level of consciousness: sedated and patient remains intubated per anesthesia plan Pain management: pain level controlled Vital Signs Assessment: vitals unstable Respiratory status: respiratory function stable, patient remains intubated per anesthesia plan and patient on ventilator - see flowsheet for VS Cardiovascular status: norepinephrine gtt. Postop Assessment: no signs of nausea or vomiting Anesthetic complications: no    Last Vitals:  Filed Vitals:   07/02/15 0730 07/02/15 0740  BP:    Pulse: 56   Temp:  37.4 C  Resp: 14     Last Pain: There were no vitals filed for this visit.               Cecile HearingStephen Edward Turk

## 2015-07-02 NOTE — Progress Notes (Signed)
PT Cancellation Note  Patient Details Name: Wyatt CarrelRalph C Dinger MRN: [id number] DOB: 11/04/1945   Cancelled Treatment:    Reason Eval/Treat Not Completed: Medical issues which prohibited therapy (Pt intubated and not ready for PT per nurse. Check back tomorrow.  Thanks.)   Tawni MillersWhite, Adolf Ormiston F 07/02/2015, 10:23 AM Eber Jonesawn Kyrene Longan,PT Acute Rehabilitation [phone] (pager)

## 2015-07-02 NOTE — Progress Notes (Signed)
eLink Physician-Brief Progress Note Patient Name: Wyatt CarrelRalph C Miller DOB: 08/07/1945 MRN: [id number]   Date of Service  07/02/2015  HPI/Events of Note  Urine output drop At risk ATN from prior OR bleedinfg On levophed pcxr last without edema  eICU Interventions  Bolus Ua, urine na, osm cvp Pos balance remains as goal     Intervention Category Intermediate Interventions: Oliguria - evaluation and management  FEINSTEIN,DANIEL J. 07/02/2015, 3:43 AM

## 2015-07-02 NOTE — Progress Notes (Signed)
eLink Physician-Brief Progress Note Patient Name: Wyatt CarrelRalph C Kunz DOB: 01/08/1946 MRN: [id number]   Date of Service  07/02/2015  HPI/Events of Note  Agitation - Currently on a Fentanyl IV infusion at 375 mcg/hour.   eICU Interventions  Will order: 1. Increase Fentanyl to 25-50 mcg Q 1 hour PRN agitation. 2. Increase Versed to 1 mg IV Q 1 hour PRN agitation.     Intervention Category Minor Interventions: Agitation / anxiety - evaluation and management  Artis Buechele Eugene 07/02/2015, 3:24 PM

## 2015-07-02 NOTE — Progress Notes (Addendum)
AAA Progress Note    07/02/2015 7:52 AM 1 Day Post-Op   Subjective:  intubated  Vitals: Tm 100.1 now 99.4 HR 40's-50's (40's yesterday afternoon) 70's-150's systolic (70's-80's since 0100) 100% .[id number]   Gtts: Levophed 3mcg (was off Levo yesterday afternoon and restarted 2000) Fentanyl  Abx: Vanc while nasal packing is in  Filed Vitals:   07/02/15 0730 07/02/15 0740  BP:    Pulse: 56   Temp:  99.4 F (37.4 C)  Resp: 14     Physical Exam: Cardiac:  regular Lungs:  Decreased BS at right base otherwise clear Abdomen:  Distended/firm; -BS Incisions:  Laparotomy incision with some bloody drainage (bandage changed overnight); LLE below knee incision is clean and dry Extremities:  Bilateral feet are cool to touch with palpable DP pulses bilaterally (right 1+ & left 2+) HENT: Still with some bloody drainage from nares (nasal packing saturated) Neuro:  Sedated; did follow commands for RN this am  CBC    Component Value Date/Time   WBC 12.1* 07/02/2015 0400   WBC 6.9 03/18/2014 1605   RBC 3.00* 07/02/2015 0400   RBC 3.94* 03/18/2014 1605   HGB 9.2* 07/02/2015 0400   HCT 28.9* 07/02/2015 0400   PLT 144* 07/02/2015 0400   MCV 96.3 07/02/2015 0400   MCH 30.7 07/02/2015 0400   MCH 33.0 03/18/2014 1605   MCHC 31.8 07/02/2015 0400   MCHC 34.7 03/18/2014 1605   RDW 16.2* 07/02/2015 0400   RDW 14.3 03/18/2014 1605   LYMPHSABS 1.9 12/13/2014 1435   LYMPHSABS 1.9 03/18/2014 1605   MONOABS 0.5 12/13/2014 1435   EOSABS 0.2 12/13/2014 1435   EOSABS 0.3 03/18/2014 1605   BASOSABS 0.1 12/13/2014 1435   BASOSABS 0.0 03/18/2014 1605    BMET    Component Value Date/Time   NA 141 07/02/2015 0400   NA 143 03/18/2014 1605   K 5.5* 07/02/2015 0400   CL 112* 07/02/2015 0400   CO2 22 07/02/2015 0400   GLUCOSE 131* 07/02/2015 0400   GLUCOSE 93 03/18/2014 1605   BUN 33* 07/02/2015 0400   BUN 16 03/18/2014 1605   CREATININE 2.46* 07/02/2015 0400   CALCIUM 8.6* 07/02/2015  0400   GFRNONAA 25* 07/02/2015 0400   GFRAA 29* 07/02/2015 0400    INR    Component Value Date/Time   INR 1.32 04/30/15 2110     Intake/Output Summary (Last 24 hours) at 07/02/15 0752 Last data filed at 07/02/15 0740  Gross per 24 hour  Intake 16061.54 ml  Output   5605 ml  Net 10456.54 ml   NGT output:  365cc  PCXR 07/02/15:  Right base atelectasis or possible early infiltrate   Assessment/Plan:  70 y.o. male is s/p  Repair of juxtarenal abdominal aortic aneurysm (16 mm Dacron graft), repair of inferior vena cava, left popliteal and tibial embolectomy 1 Day Post-Op  -ARF-creatinine continues to increase from 1.22 pre-op to 1.75 post op to 2.46 this morning with hyperkalemia with increase in K+ at 5.5 (down from 5.8 yesterday).  Pt did require suprarenal clamp intraop.  UOP has decreased.  May need nephrology consult. -coagulopathy:   Post op INR 1.47, fibrinogen 180 and APTT >200--received 6 units FFP and one platelet pheresis pack and 2 units PRBC's.  INR last evening down to 1.32 and APTT 49.  Plts improved.  Hematuria clearing.  Still with bloody drainage from nares with saturation of nasal packing.  Some bloody drainage from incision, but bandage with mild saturation since change.  -  hypotension-was off Levophed yesterday, however, he was started back last evening and is on 35mcg/min -acute surgical blood loss anemia--hgb down slightly from last pm from 9.8 to 9.2 -DP pulse on the right not as easily palpable - most likely vasoconstriction from Levophed -wean as tolerated -continues to be on the vent-will d/w Dr. Darrick Penna if he wants to wean today given multiple issues -DM:  Blood glucose yesterday 210 post operatively and IVF changed to NaCl.  Glucose this am is 131. -increased LFT's from yesterday afternoon-continue to monitor -WBC stable from yesterday.  Abx - Vanc continued while nasal packing in place   Doreatha Massed, PA-C [facility] [phone] 07/02/2015 7:52 AM  Abdomen soft 2+ DP bilaterally Coagulapathy corrected Renal failure but making urine with lasix Hopefully extubate over the next 1-2 days depending on hemodynamic stability and renal function  Fabienne Bruns, MD [facility] Office: [phone] Pager: [phone]

## 2015-07-02 NOTE — Progress Notes (Signed)
07/02/2015 1100 Pt with intermittent episodes of agitation despite calming measures and Fentanyl prn bolus. Fentanyl gtt currently at 375 mcg/hr with pt. Continued agitation. Brett CanalesSteve Minor NP paged and made aware. NP to place orders for PRN versed pushes. Orders enacted. Pt. Wife at bedside and updated on plan of care.  Gracielynn Birkel, Blanchard KelchStephanie Ingold

## 2015-07-02 NOTE — Progress Notes (Signed)
07/02/2015 0950 RT notified to change vent settings per orders. RT to enact orders. Will continue to closely monitor patient.  Nobie Alleyne, Blanchard KelchStephanie Ingold

## 2015-07-02 NOTE — Progress Notes (Signed)
OT Cancellation Note  Patient Details Name: Jaci CarrelRalph C Darius MRN: [id number] DOB: 11/09/1945   Cancelled Treatment:    Reason Eval/Treat Not Completed: Patient not medically ready - Pt remains intubated and sedated per notes.  Will reattempt.   Angelene GiovanniConarpe, Casidy Alberta M  Kelton Bultman Climaxonarpe, OTR/L [phone]  07/02/2015, 9:04 AM
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
## 2015-07-03 ENCOUNTER — Inpatient Hospital Stay (HOSPITAL_COMMUNITY): Payer: Medicare Other

## 2015-07-03 DIAGNOSIS — J9600 Acute respiratory failure, unspecified whether with hypoxia or hypercapnia: Secondary | ICD-10-CM

## 2015-07-03 LAB — GLUCOSE, CAPILLARY
GLUCOSE-CAPILLARY: 72 mg/dL (ref 65–99)
GLUCOSE-CAPILLARY: 217 mg/dL — AB (ref 65–99)
GLUCOSE-CAPILLARY: 113 mg/dL — AB (ref 65–99)
Glucose-Capillary: 108 mg/dL — ABNORMAL HIGH (ref 65–99)
Glucose-Capillary: 121 mg/dL — ABNORMAL HIGH (ref 65–99)
Glucose-Capillary: 123 mg/dL — ABNORMAL HIGH (ref 65–99)
Glucose-Capillary: 48 mg/dL — ABNORMAL LOW (ref 65–99)
Glucose-Capillary: 63 mg/dL — ABNORMAL LOW (ref 65–99)
Glucose-Capillary: 68 mg/dL (ref 65–99)

## 2015-07-03 LAB — BASIC METABOLIC PANEL
ANION GAP: 6 (ref 5–15)
BUN: 52 mg/dL — ABNORMAL HIGH (ref 6–20)
CHLORIDE: 110 mmol/L (ref 101–111)
CO2: 22 mmol/L (ref 22–32)
Calcium: 7.1 mg/dL — ABNORMAL LOW (ref 8.9–10.3)
Creatinine, Ser: 3.53 mg/dL — ABNORMAL HIGH (ref 0.61–1.24)
GFR calc Af Amer: 19 mL/min — ABNORMAL LOW (ref >60)
GFR, EST NON AFRICAN AMERICAN: 16 mL/min — AB (ref >60)
GLUCOSE: 120 mg/dL — AB (ref 65–99)
POTASSIUM: 4 mmol/L (ref 3.5–5.1)
Sodium: 138 mmol/L (ref 135–145)

## 2015-07-03 LAB — HEMOGLOBIN A1C
Hgb A1c MFr Bld: 6 % — ABNORMAL HIGH (ref 4.8–5.6)
Mean Plasma Glucose: 126 mg/dL

## 2015-07-03 LAB — RENAL FUNCTION PANEL
Albumin: 2.4 g/dL — ABNORMAL LOW (ref 3.5–5.0)
Anion gap: 8 (ref 5–15)
BUN: 46 mg/dL — AB (ref 6–20)
CHLORIDE: 108 mmol/L (ref 101–111)
CO2: 21 mmol/L — AB (ref 22–32)
Calcium: 7.5 mg/dL — ABNORMAL LOW (ref 8.9–10.3)
Creatinine, Ser: 3.27 mg/dL — ABNORMAL HIGH (ref 0.61–1.24)
GFR calc Af Amer: 21 mL/min — ABNORMAL LOW (ref >60)
GFR calc non Af Amer: 18 mL/min — ABNORMAL LOW (ref >60)
GLUCOSE: 125 mg/dL — AB (ref 65–99)
POTASSIUM: 3.9 mmol/L (ref 3.5–5.1)
Phosphorus: 4.1 mg/dL (ref 2.5–4.6)
Sodium: 137 mmol/L (ref 135–145)

## 2015-07-03 LAB — CBC
HEMATOCRIT: 24.8 % — AB (ref 39.0–52.0)
HEMATOCRIT: 24.5 % — AB (ref 39.0–52.0)
HEMOGLOBIN: 7.9 g/dL — AB (ref 13.0–17.0)
HEMOGLOBIN: 7.8 g/dL — AB (ref 13.0–17.0)
MCH: 31 pg (ref 26.0–34.0)
MCH: 30.6 pg (ref 26.0–34.0)
MCHC: 31.9 g/dL (ref 30.0–36.0)
MCHC: 31.8 g/dL (ref 30.0–36.0)
MCV: 97.3 fL (ref 78.0–100.0)
MCV: 96.1 fL (ref 78.0–100.0)
PLATELETS: 106 10*3/uL — AB (ref 150–400)
Platelets: 112 10*3/uL — ABNORMAL LOW (ref 150–400)
RBC: 2.55 MIL/uL — ABNORMAL LOW (ref 4.22–5.81)
RBC: 2.55 MIL/uL — ABNORMAL LOW (ref 4.22–5.81)
RDW: 15.8 % — ABNORMAL HIGH (ref 11.5–15.5)
RDW: 15.7 % — ABNORMAL HIGH (ref 11.5–15.5)
WBC: 11.7 10*3/uL — ABNORMAL HIGH (ref 4.0–10.5)
WBC: 9.9 10*3/uL (ref 4.0–10.5)

## 2015-07-03 LAB — BLOOD GAS, ARTERIAL
ACID-BASE DEFICIT: 3.3 mmol/L — AB (ref 0.0–2.0)
Bicarbonate: 20.6 mEq/L (ref 20.0–24.0)
Drawn by: 252031
FIO2: 0.4
LHR: 12 {breaths}/min
MECHVT: 600 mL
O2 SAT: 96.3 %
PATIENT TEMPERATURE: 98.6
PCO2 ART: 33.7 mmHg — AB (ref 35.0–45.0)
PEEP/CPAP: 5 cmH2O
PH ART: 7.404 (ref 7.350–7.450)
TCO2: 21.7 mmol/L (ref 0–100)
pO2, Arterial: 89.2 mmHg (ref 80.0–100.0)

## 2015-07-03 LAB — POCT I-STAT 3, ART BLOOD GAS (G3+)
Acid-base deficit: 1 mmol/L (ref 0.0–2.0)
Bicarbonate: 23.3 mEq/L (ref 20.0–24.0)
O2 Saturation: 95 %
PCO2 ART: 37 mmHg (ref 35.0–45.0)
Patient temperature: 98.6
TCO2: 24 mmol/L (ref 0–100)
pH, Arterial: 7.407 (ref 7.350–7.450)
pO2, Arterial: 74 mmHg — ABNORMAL LOW (ref 80.0–100.0)

## 2015-07-03 LAB — PROTIME-INR
INR: 1.24 (ref 0.00–1.49)
Prothrombin Time: 15.8 seconds — ABNORMAL HIGH (ref 11.6–15.2)

## 2015-07-03 LAB — PROCALCITONIN: Procalcitonin: 1.08 ng/mL

## 2015-07-03 MED ORDER — VITAL HIGH PROTEIN PO LIQD
1000.0000 mL | ORAL | Status: DC
  Administered 2015-07-03: 1000 mL

## 2015-07-03 MED ORDER — SODIUM CHLORIDE 0.9 % IV SOLN: 1500.0000 mg | INTRAVENOUS | Status: DC

## 2015-07-03 MED ORDER — JEVITY 1.2 CAL PO LIQD: 1000.0000 mL | ORAL | Status: DC

## 2015-07-03 MED ORDER — DEXTROSE 5 % IV SOLN
160.0000 mg | Freq: Two times a day (BID) | INTRAVENOUS | Status: DC
  Administered 2015-07-03 – 2015-07-05 (×4): 160 mg via INTRAVENOUS
  Filled 2015-07-03 (×5): qty 16

## 2015-07-03 MED ORDER — PRO-STAT SUGAR FREE PO LIQD
60.0000 mL | Freq: Two times a day (BID) | ORAL | Status: DC
  Administered 2015-07-03 – 2015-07-14 (×15): 60 mL
  Filled 2015-07-03 (×17): qty 60

## 2015-07-03 MED ORDER — DEXTROSE 50 % IV SOLN
INTRAVENOUS | Status: AC
  Administered 2015-07-03: 25 mL
  Filled 2015-07-03: qty 50

## 2015-07-03 MED ORDER — FAMOTIDINE IN NACL 20-0.9 MG/50ML-% IV SOLN
20.0000 mg | INTRAVENOUS | Status: DC
  Administered 2015-07-03 – 2015-07-07 (×5): 20 mg via INTRAVENOUS
  Filled 2015-07-03 (×5): qty 50

## 2015-07-03 NOTE — Care Management Note (Signed)
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  Pt's ex-wife lives with her but she is frail and unable to assist with any of his care.  Dtr is hopeful pt will qualify for ST-SNF for rehab.                              Expected Discharge Plan:  Skilled Nursing Facility  In-House Referral:  Clinical Social Work  Discharge planning Services  CM Consult  Status of Service:  In process, will continue to follow  Magdalene RiverMayo, Toriana Sponsel T, RN 07/03/2015, 10:57 AM

## 2015-07-03 NOTE — Progress Notes (Signed)
OT Cancellation Note  Patient Details Name: Wyatt CarrelRalph C Miller MRN: [id number] DOB: 05/29/1945   Cancelled Treatment:    Reason Eval/Treat Not Completed: Patient not medically ready - pt intubated and sedated.  Will try back tomorrow.   Angelene GiovanniConarpe, Ila Landowski M  Jeremy Ditullio Warrenonarpe, OTR/L [phone]  07/03/2015, 2:52 PM

## 2015-07-03 NOTE — Progress Notes (Signed)
PULMONARY / CRITICAL CARE MEDICINE   Name: [name] MRN: [id number] DOB: January 19, 1946    ADMISSION DATE:  07/06/2015 CONSULTATION DATE:  07/13/2015  REFERRING MD:  Dr. Darrick Penna  CHIEF COMPLAINT:  Vent Management   HISTORY OF PRESENT ILLNESS:   This is a 70 y.o. Male with a past medical history of Hypertension, CAD, AAA, High Cholesterol, Depression, Anxiety, Essential Tumor, Dementia, Asthma, COPD, former smoker, Anemia, MI, Pneumonia, and Stroke.  He was seen in February with CTA after his duplex revealed an increase in size from 4.8 cm to 5.2 cm.  The CTA revealed a 5.7 AAA.  On 5/11 he was told he was not a candidate for stent graft repair however he was a candidate for open repair.  He was taken to the OR on 6/6 for open AAA repair.  PCCM consulted on 6/6 for vent management  SUBJECTIVE:  Pt intubated and sedated  requiring low dose levophed to maintain map >65  VITAL SIGNS: BP 133/46 mmHg  Pulse 68  Temp(Src) 97.9 F (36.6 C) (Axillary)  Resp 20  Ht 5\' 11"  (1.803 m)  Wt 224 lb 13.9 oz (102 kg)  BMI 31.38 kg/m2  SpO2 96%  HEMODYNAMICS: CVP:  [9 mmHg-16 mmHg] 9 mmHg  VENTILATOR SETTINGS: Vent Mode:  [-] PRVC FiO2 (%):  [40 %-50 %] 40 % Set Rate:  [12 bmp-20 bmp] 20 bmp Vt Set:  [600 mL] 600 mL PEEP:  [5 cmH20] 5 cmH20 Plateau Pressure:  [21 cmH20-2323 cmH20] 2323 cmH20  INTAKE / OUTPUT: I/O last 3 completed shifts: In: 6728.5 [I.V.:4357.5; Blood:885; Other:240; NG/GT:220; IV Piggyback:1026] Out: 4415 [Urine:3695; Emesis/NG output:640; Other:80]  PHYSICAL EXAMINATION: General:  Ill appearing male intubated Neuro:  Sedated. Able to squeeze hand to command, unable to show 2 fingers. PERRL @ 2mm  HEENT:  ETT, NG tube in place, supple, no JVD, nasal packing removed Cardiovascular:  S1s2, sinus r, left great toe mottled, 2+pulses in left foot Lungs:  Diminished throughout, even, nonl abored, on vent Abdomen:No bowel sounds, midline abdominal incision dressing dry and  intact. Abd distended, bilateral bruising noted to lower abdomen Musculoskeletal:  Normal bulk Skin:  Midline abdominal incision dressing dry and intact, left medial thigh incision well approximated dermabond intact, slight discoloration on right nare where NGT is  LABS:  BMET  Recent Labs Lab 07/02/15 0400 07/02/15 1530 07/03/15 0305  NA 141 140 137  K 5.5* 4.5 3.9  CL 112* 111 108  CO2 22 22 21*  BUN 33* 42* 46*  CREATININE 2.46* 3.11* 3.27*  GLUCOSE 131* 120* 125*    Electrolytes  Recent Labs Lab 07/24/2015 1530 07/02/15 0400 07/02/15 1530 07/03/15 0305  CALCIUM 8.1* 8.6* 7.8* 7.5*  MG 1.7 1.7  --   --   PHOS  --   --   --  4.1    CBC  Recent Labs Lab 06/27/2015 2110 07/02/15 0400 07/02/15 0930 07/03/15 0305  WBC 12.1* 12.1*  --  11.7*  HGB 9.8* 9.2*  --  7.9*  HCT 30.7* 28.9*  --  24.8*  PLT 152 144* 132* 112*    Coag's  Recent Labs Lab 07/04/2015 1830 07/06/2015 2110 07/02/15 0930 07/03/15 0305  APTT 86* 49* 26  --   INR 1.36 1.32 1.17 1.24    Sepsis Markers  Recent Labs Lab 07/05/2015 2110 07/02/15 0400 07/03/15 0305  PROCALCITON 0.90 1.48 1.08    ABG  Recent Labs Lab 07/02/15 0356 07/02/15 1223 07/03/15 0308  PHART 7.253* 7.376  7.404  PCO2ART 46.8* 38.0 33.7*  PO2ART 115* 71.0* 89.2    Liver Enzymes  Recent Labs Lab 07/02/15 0400 07/03/15 0305  AST 190*  --   ALT 145*  --   ALKPHOS 35*  --   BILITOT 0.6  --   ALBUMIN 2.7* 2.4*    Cardiac Enzymes No results for input(s): TROPONINI, PROBNP in the last 168 hours.  Glucose  Recent Labs Lab 07/02/15 1518 07/02/15 1911 07/03/15 0015 07/03/15 0416 07/03/15 0723 07/03/15 0756  GLUCAP 86 103* 108* 72 63* 68    Imaging [place] Renal  07/02/2015  CLINICAL DATA:  Acute renal failure.  Diabetes mellitus EXAM: RENAL ULTRASOUND COMPARISON:  CT abdomen and pelvis February 26, 2015 FINDINGS: Right Kidney: Length: 12.6 cm. Echogenicity and renal cortical thickness are within normal  limits. No mass, perinephric fluid, or hydronephrosis visualized. No sonographically demonstrable calculus or ureterectasis. Left Kidney: Length: 11.6 cm. Echogenicity and renal echogenicity are within normal limits. No mass, perinephric fluid, or hydronephrosis visualized. No sonographically demonstrable calculus or ureterectasis. Bladder: Urinary bladder is decompressed by Foley catheter. IMPRESSION: Normal appearing kidneys bilaterally by ultrasound. Electronically Signed   By: Bretta BangWilliam  Woodruff III M.D.   On: 07/02/2015 13:54   Dg Chest Port 1 View  07/03/2015  CLINICAL DATA:  Endotracheal tube, followup, postop repair of abdominal aortic aneurysm EXAM: PORTABLE CHEST 1 VIEW COMPARISON:  Portable chest x-ray of 07/02/2015 and 07/05/2015 FINDINGS: The lungs are not well aerated with bibasilar opacities most consistent with atelectasis. Small effusions cannot be excluded. Cardiomegaly is stable. Endotracheal tube remains with the tip approximately 3.1 cm above the carina. Right IJ central venous line tip overlies the mid SVC and and NG tube is present. IMPRESSION: 1. Diminished aeration with increase in bibasilar opacities most consistent with atelectasis. 2. Tip of endotracheal tube approximately 3.1 cm above the carina. Electronically Signed   By: Dwyane DeePaul  Barry M.D.   On: 07/03/2015 08:16     STUDIES:  None  CULTURES: None  ANTIBIOTICS: Vancomycin 6/6>>  SIGNIFICANT EVENTS: 6/6-Pt hd open AAA repair remains intubated post surgery. Course c/b coagulopathy (likely dilutional), acute renal injury, shock.   LINES/TUBES: RIJ dual lumen CVC 6/6>> L radial aline 6/6>> PIV x1>> ETT 6/6>>  DISCUSSION: On 5/11 he was told he was not a candidate for stent graft repair however he was a candidate for open repair.  He was taken to the OR on 6/6 for open AAA repair.  PCCM consulted on 6/6 for vent management, shock.   ASSESSMENT / PLAN:  PULMONARY A: Mechanically intubated post surgery Hx: COPD,  former smoker  P:   Full vent support  - decrease vent rate 16  - continue full vent support  - will attempt to wean tomorrow (6/9) Vent Bundle Trend ABG's  Daily CXR  CARDIOVASCULAR A: Hypotension, AAA open repair, Sinus Bradycardia Hx: MI, Stroke, Heart Murmur, Anginal Pain, CAD, HTN P:  Vascular surgery follwing Titrate Levophed drip to maintain map >65  -currently off Telemetry monitoring   RENAL A:   Acute Renal failure, ATN Hyperkalemia  P:   Creatinine continues to rise  - Renal following  - Repeat BMP this afternoon Monitor UOP  - - 1.5 liters for last 24 hours  - Lasix 160 mg decreased to BID per renal CVP 4-5 Replace electrolytes as indicated 6/7 renal US for completeness  - normal appearing kidneys (report above) May need CVVH if K+ continues to rise  - potassium stable  Slight metabolic acidosis  on BMP but compensated with vent  GASTROINTESTINAL A:   Hx: Pyloric Spasms P:   Keep NPO for now  - NPO x 2 days  - consider parental versus enteral feeds in next 24 hours PPI for PUP  HEMATOLOGIC  Recent Labs  07/02/15 0400 07/03/15 0305  HGB 9.2* 7.9*   Lab Results  Component Value Date   INR 1.24 07/03/2015   INR 1.17 07/02/2015   INR 1.32 07/07/2015    A:   Anemia, hematuria, Profuse nosebleed (nose packed per Vascular Surgery) P:  Drop in HgB over last 24 hours  - repeat CBC this afternoon  - transfuse for HgB < 7.0 Transfuse FFP and pRBC's per Vascular Surgery recommendations Monitor s/sx for bleeding Nasal packing out  INFECTIOUS A:   No problems identified P:   Vancomycin post-op prophylaxis, will need to renal dose Trend WBC's 14->12.1 procalcitonin 1.08   ENDOCRINE CBG (last 3)   A:   Type II DM P:   CBG monitoring q4hrs  - Consider adding D5NS if hypoglycemia continues SSI Hyper/Hypoglycemic protocol as indicated   NEUROLOGIC A:  Pain Hx: Depression, Anxiety, Essential tremor, Dementia P:   RASS goal: -1   Precedex off Fentanyl drip  - wean for RASS -1 Considering adding Precedex or PRN Versed to help with agitation in order wean Fentanyl.    FAMILY  - Updates: Dr Delton Coombes updated pt's wife at bedside 6/7.   - Inter-disciplinary family meet or Palliative Care meeting due by: July 08, 2015  Jacqulyn Cane FNP, ACNP-S Adolph Pollack PCCM 07/03/2015, 8:41 AM  Attending Note:  I have examined patient, reviewed labs, studies and notes. I have discussed the case with S Allred NP, and I agree with the data and plans as amended above. 69 yo man with COPD, hx CVA, now s/p open AAA repair 6/6. He remains intubated, had post-op shock but now norepi off. Course c/b ATN, improved UOP on lasix but no plateau in S Cr yet. Note drop Hgb last 24h, following CBC. On my eval he is comfortable on fentanyl gtt. Nasal packing removed. Lungs remains clear.  We will decrease his Ve. Assess for WUA and SBT today and daily. Independent critical care time is 33 minutes.    Levy Pupa, MD, PhD 07/03/2015, 11:17 AM  Pulmonary and Critical Care [phone] or if no answer [phone]

## 2015-07-03 NOTE — Progress Notes (Signed)
PT Cancellation Note  Patient Details Name: Wyatt CarrelRalph C Miller MRN: [id number] DOB: 10/12/1945   Cancelled Treatment:    Reason Eval/Treat Not Completed: Medical issues which prohibited therapy (Pt intubated and not ready for PT per nurse. Check back tomorrow per nurse.)   Tawni MillersWhite, [name] F 07/03/2015, 1:39 PM [facility] Center[facility] Sheana Bir,PT Acute Rehabilitation [phone] (pager)

## 2015-07-03 NOTE — Progress Notes (Signed)
Pharmacy Antibiotic Note  Jaci CarrelRalph C Tieu is a 70 y.o. male admitted on 06/30/2015 for a planned abdominal aortic aneurysm repair. The patient received Vancomycin pre-op and post-op. Pharmacy has been consulted to continue Vancomycin dosing while nasal packing remain in place from profuse nosebleed. Nasal packing was removed today. Tmax is 101.1 and WBC is WNL. SCr is trending up.   Plan: - Change vancomycin to 1500mg  IV Q48H - F/u renal fxn, C&S, clinical status - F/u planned LOT since nasal packing has now been removed  Height: 5\' 11"  (180.3 cm) Weight: 224 lb 13.9 oz (102 kg) IBW/kg (Calculated) : 75.3  Temp (24hrs), Avg:100 F (37.8 C), Min:97.9 F (36.6 C), Max:101.1 F (38.4 C)   Recent Labs Lab April 09, 2015 1530 April 09, 2015 2110 07/02/15 0400 07/02/15 1530 07/03/15 0305 07/03/15 1150  WBC 14.8* 12.1* 12.1*  --  11.7* 9.9  CREATININE 1.75*  --  2.46* 3.11* 3.27* 3.53*    Estimated Creatinine Clearance: 23.7 mL/min (by C-G formula based on Cr of 3.53).    Antimicrobials this admission: Vanc 6/6 >>  Dose adjustments this admission: n/a  Microbiology results: 5/31 MRSA PCR: negative  Thank you for allowing pharmacy to be a part of this patient's care.  Lysle Pearlachel Joshau Code, PharmD, BCPS Pager # [phone] 07/03/2015 1:28 PM

## 2015-07-03 NOTE — Progress Notes (Addendum)
Nutrition Consult/Follow Up  DOCUMENTATION CODES:   Not applicable  INTERVENTION:   Initiate Vital High Protein formula at 15 ml/hr and increase by 10 ml every 8 hours to goal rate of 45 ml/hr   Prostat liquid protein 60 ml BID via tube  Total TF regimen to provide 1480 kcals, 154 gm protein, 903 ml of free water  NUTRITION DIAGNOSIS:   Inadequate oral intake related to inability to eat as evidenced by NPO status  GOAL:   Provide needs based on ASPEN/SCCM guidelines  MONITOR:   TF tolerance, Vent status, Labs, Weight trends, I & O's  REASON FOR ASSESSMENT:   Consult Enteral/tube feeding initiation and management  ASSESSMENT:   70 yo Male admitted 06/30/15 for elective repair of AAA. The aorta was clamped above the renal arteries and post op he has had issues with hypotension with SBP in the 70's. Pre-OP Scr 06/25/15 was 1.32, 6/617 Scr was 1.75 and today 2.46. UO only 560cc yest and only 35cc so far today. Hx of HTN for >5363yrs and DM x 7-6517yrs per wife. CVP 20. Bp has improved now and is in low 100's on levophed gtt.   Patient s/p procedure 6/6: REPAIR OF JUXTARENAL ABDOMINAL AORTIC ANEURYSM REPAIR OF INFERIOR VENA CAVA LEFT POPLITEAL AND TIBIAL EMBOLECTOMY  Patient is currently intubated on ventilator support Temp (24hrs), Avg:100 F (37.8 C), Min:97.9 F (36.6 C), Max:101.1 F (38.4 C)   NGT in place >> had nosebleed (packed). Nephrology note reviewed >> renal function improved. RD consulted for TF initiation & management.  Diet Order:  Diet NPO time specified  Skin:  Reviewed, no issues  Last BM:  6/6  Height:   Ht Readings from Last 1 Encounters:  01/14/16 5\' 11"  (1.803 m)    Weight:   Wt Readings from Last 1 Encounters:  07/02/15 224 lb 13.9 oz (102 kg)    Ideal Body Weight:  78 kg  BMI:  Body mass index is 31.38 kg/(m^2).  Estimated Nutritional Needs:   Kcal:  [id number]  Protein:  150-160 gm  Fluid:  per MD  EDUCATION NEEDS:    No education needs identified at this time  Maureen ChattersKatie Pryor Guettler, RD, LDN Pager #: [phone] After-Hours Pager #: [phone]

## 2015-07-03 NOTE — Care Management Important Message (Signed)
Important Message  Patient Details  Name: Jaci CarrelRalph C Blase MRN: [id number] Date of Birth: 02/22/1945   Medicare Important Message Given:  Yes    Kyla BalzarineShealy, Kwamaine Cuppett Abena 07/03/2015, 10:21 AM

## 2015-07-03 NOTE — Progress Notes (Signed)
Hypoglycemic Event  CBG: 63  Treatment:  25 ml of d50  Symptoms: none, pt sedated on vent  Follow-up CBG:0800 Time:CBG Result 217  Possible Reasons for Event:   Comments/MD notified:     Rhina BrackettWoody, Lucian Baswell Christine

## 2015-07-03 NOTE — Progress Notes (Signed)
S: intubated, sedated.  Off levophed O:BP 99/60 mmHg  Pulse 63  Temp(Src) 100.4 F (38 C) (Oral)  Resp 20  Ht _0  (1.803 m)  Wt 102 kg (224 lb 13.9 oz)  BMI 31.38 kg/m2  SpO2 100%  Intake/Output Summary (Last 24 hours) at 07/03/15 0721 Last data filed at 07/03/15 0600  Gross per 24 hour  Intake 1987.65 ml  Output   3775 ml  Net -1787.35 ml   Weight change:  XVQ:[id number], sedated CVS:RRR Resp: Few basilar crackles ant Abd: No BS, mild distention.  Wound clean Ext: no edema.  Some ischemic changes to left gr toe NEURO:sedated   . sodium chloride  10 mL/hr Intravenous Once  . sodium chloride  10 mL/hr Intravenous Once  . sodium chloride   Intravenous Once  . antiseptic oral rinse  7 mL Mouth Rinse 10 times per day  . chlorhexidine gluconate (SAGE KIT)  15 mL Mouth Rinse BID  . docusate sodium  100 mg Oral Daily  . famotidine (PEPCID) IV  20 mg Intravenous Q12H  . furosemide  160 mg Intravenous Q8H  . insulin aspart  0-15 Units Subcutaneous Q4H  . mometasone-formoterol  2 puff Inhalation BID  . pantoprazole  40 mg Oral Daily  . vancomycin  1,500 mg Intravenous Q24H   US Renal  07/02/2015  CLINICAL DATA:  Acute renal failure.  Diabetes mellitus EXAM: RENAL ULTRASOUND COMPARISON:  CT abdomen and pelvis February 26, 2015 FINDINGS: Right Kidney: Length: 12.6 cm. Echogenicity and renal cortical thickness are within normal limits. No mass, perinephric fluid, or hydronephrosis visualized. No sonographically demonstrable calculus or ureterectasis. Left Kidney: Length: 11.6 cm. Echogenicity and renal echogenicity are within normal limits. No mass, perinephric fluid, or hydronephrosis visualized. No sonographically demonstrable calculus or ureterectasis. Bladder: Urinary bladder is decompressed by Foley catheter. IMPRESSION: Normal appearing kidneys bilaterally by ultrasound. Electronically Signed   By: Lowella Grip III M.D.   On: 07/02/2015 13:54   Dg Chest Port 1  View  07/02/2015  CLINICAL DATA:  Post abdominal aortic aneurysm repair EXAM: PORTABLE CHEST 1 VIEW COMPARISON:  07/24/2015 FINDINGS: Cardiomegaly again noted. Stable endotracheal and NG tube position. Stable right IJ central line position. There is right base medially platelike atelectasis or early infiltrate. Left basilar atelectasis. No pulmonary edema. IMPRESSION: Cardiomegaly. Stable support apparatus. Right base medially platelike atelectasis or early infiltrate. Left basilar atelectasis. No pulmonary edema. Electronically Signed   By: Lahoma Crocker M.D.   On: 07/02/2015 08:14   Dg Chest Port 1 View  07/12/2015  CLINICAL DATA:  Postoperative radiograph.  Central line placement. EXAM: PORTABLE CHEST 1 VIEW COMPARISON:  12/13/2014 FINDINGS: Patient is intubated. Endotracheal tube terminates at the level of the carina. Retraction by 2-4 cm is recommended. Enteric catheter is seen overlying the expected location of gastric body. Right internal jugular approach central venous catheter terminates within the expected location of superior vena cava. Cardiomediastinal silhouette is normal. Mediastinal contours appear intact. There is no evidence of focal airspace consolidation, pleural effusion or pneumothorax. Osseous structures are without acute abnormality. Soft tissues are grossly normal. IMPRESSION: Endotracheal tube at the level of the carina. Retraction by 2-4 cm is recommended. Right internal jugular approach central venous catheter terminates within the expected location of superior vena cava. No evidence of active cardiopulmonary disease, or pneumothorax. Electronically Signed   By: Fidela Salisbury M.D.   On: 07/23/2015 15:05   Dg Abd Portable 1v  07/09/2015  CLINICAL DATA:  Nasogastric tube placement EXAM:  PORTABLE ABDOMEN - 1 VIEW COMPARISON:  None. FINDINGS: Nasogastric tube is not appreciable on current image. Bowel gas pattern is unremarkable. There are skin staples to the left of midline.  IMPRESSION: Nasogastric tube not appreciable.  Bowel gas pattern unremarkable. These results will be called to the ordering clinician or representative by the Radiologist Assistant, and communication documented in the PACS or zVision Dashboard. Electronically Signed   By: Lowella Grip III M.D.   On: 07/13/2015 15:02   BMET    Component Value Date/Time   NA 137 07/03/2015 0305   NA 143 03/18/2014 1605   K 3.9 07/03/2015 0305   CL 108 07/03/2015 0305   CO2 21* 07/03/2015 0305   GLUCOSE 125* 07/03/2015 0305   GLUCOSE 93 03/18/2014 1605   BUN 46* 07/03/2015 0305   BUN 16 03/18/2014 1605   CREATININE 3.27* 07/03/2015 0305   CALCIUM 7.5* 07/03/2015 0305   GFRNONAA 18* 07/03/2015 0305   GFRAA 21* 07/03/2015 0305   CBC    Component Value Date/Time   WBC 11.7* 07/03/2015 0305   WBC 6.9 03/18/2014 1605   RBC 2.55* 07/03/2015 0305   RBC 3.94* 03/18/2014 1605   HGB 7.9* 07/03/2015 0305   HCT 24.8* 07/03/2015 0305   PLT 112* 07/03/2015 0305   MCV 97.3 07/03/2015 0305   MCH 31.0 07/03/2015 0305   MCH 33.0 03/18/2014 1605   MCHC 31.9 07/03/2015 0305   MCHC 34.7 03/18/2014 1605   RDW 15.8* 07/03/2015 0305   RDW 14.3 03/18/2014 1605   LYMPHSABS 1.9 12/13/2014 1435   LYMPHSABS 1.9 03/18/2014 1605   MONOABS 0.5 12/13/2014 1435   EOSABS 0.2 12/13/2014 1435   EOSABS 0.3 03/18/2014 1605   BASOSABS 0.1 12/13/2014 1435   BASOSABS 0.0 03/18/2014 1605     Assessment: 1. 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 UO has improved and hopefully renal fx will start to improve in next 1-2d.  Renal US NL 2. SP AAA repair 3. Hx HTH 4. DM  Plan: 1. Decrease lasix to BID.   2. Daily labs     Jisele Price T

## 2015-07-03 NOTE — Progress Notes (Addendum)
AAA Progress Note    07/03/2015 7:19 AM 2 Days Post-Op  Subjective:  intubated  Tm 101.1 most recent 100.4 HR  60's-70's NSR (10 beat run of VTach yesterday evening-asymptomatic) 60's (at [facility]) - 140's systolic 100% .[id number]   Gtts: Levophed Off this morning @ 0400 Fentanyl  Abx: Vancomycin  Filed Vitals:   07/03/15 0630 07/03/15 0645  BP: 101/68 99/60  Pulse: 65 63  Temp:    Resp: 20 20    Physical Exam: Cardiac:  regular Lungs:  Lung bases sound a little better this morning Abdomen:  Less firm; minimal BS Incisions:  Minimal bloody drainage from wound Extremities:  2+ palpable pulses DP - some mottling of feet (left > right)  CBC    Component Value Date/Time   WBC 11.7* 07/03/2015 0305   WBC 6.9 03/18/2014 1605   RBC 2.55* 07/03/2015 0305   RBC 3.94* 03/18/2014 1605   HGB 7.9* 07/03/2015 0305   HCT 24.8* 07/03/2015 0305   PLT 112* 07/03/2015 0305   MCV 97.3 07/03/2015 0305   MCH 31.0 07/03/2015 0305   MCH 33.0 03/18/2014 1605   MCHC 31.9 07/03/2015 0305   MCHC 34.7 03/18/2014 1605   RDW 15.8* 07/03/2015 0305   RDW 14.3 03/18/2014 1605   LYMPHSABS 1.9 12/13/2014 1435   LYMPHSABS 1.9 03/18/2014 1605   MONOABS 0.5 12/13/2014 1435   EOSABS 0.2 12/13/2014 1435   EOSABS 0.3 03/18/2014 1605   BASOSABS 0.1 12/13/2014 1435   BASOSABS 0.0 03/18/2014 1605    BMET    Component Value Date/Time   NA 137 07/03/2015 0305   NA 143 03/18/2014 1605   K 3.9 07/03/2015 0305   CL 108 07/03/2015 0305   CO2 21* 07/03/2015 0305   GLUCOSE 125* 07/03/2015 0305   GLUCOSE 93 03/18/2014 1605   BUN 46* 07/03/2015 0305   BUN 16 03/18/2014 1605   CREATININE 3.27* 07/03/2015 0305   CALCIUM 7.5* 07/03/2015 0305   GFRNONAA 18* 07/03/2015 0305   GFRAA 21* 07/03/2015 0305    INR    Component Value Date/Time   INR 1.24 07/03/2015 0305     Intake/Output Summary (Last 24 hours) at 07/03/15 0719 Last data filed at 07/03/15 0600  Gross per 24 hour  Intake 1987.65 ml    Output   3775 ml  Net -1787.35 ml   NGT:  300cc/24hr UOP:  3475cc/24hr  Assessment/Plan:  70 y.o. 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  Hyperkalemia improved.  Appreciate nephrology's assistance.  -coagulopathy improved:  Platelet count down some from yesterday-monitor.  Hematuria resolved. -easily palpable DP pulses bilaterally now that Levophed is off.  Some mottling of the feet--hopefully this will improve with Levophed off (left > right) -acute surgical blood loss anemia:  Pt's hgb down today to 7.9 and tolerating so far-EF of 50%.  May need another unit of PRBC's if hemodynamics worsen -hopefully can wean vent in the next 1-2 days--will d/w Dr. Darrick PennaFields -DM stable - continue current sliding scale -WBC slightly improved-continue vanc until nasal packing is out   Doreatha MassedSamantha Rhyne, PA-C [facility] [phone] 07/03/2015 7:19 AM  Overall improved.  Off levophed. I removed nasal packing today Hopefully extubate next 24 hours if continues to diurese. Feet dusky but 2+ DP pulses Abdomen soft incision clean Acute non oliguric renal  failure per Nephrology VDRF per critical care  Fabienne Bruns, MD [facility] [place] Office: [phone] Pager: [phone]

## 2015-07-04 ENCOUNTER — Inpatient Hospital Stay (HOSPITAL_COMMUNITY): Payer: Medicare Other

## 2015-07-04 DIAGNOSIS — L8990 Pressure ulcer of unspecified site, unspecified stage: Secondary | ICD-10-CM | POA: Insufficient documentation

## 2015-07-04 LAB — RENAL FUNCTION PANEL
ALBUMIN: 2.1 g/dL — AB (ref 3.5–5.0)
Anion gap: 9 (ref 5–15)
BUN: 65 mg/dL — AB (ref 6–20)
CO2: 21 mmol/L — ABNORMAL LOW (ref 22–32)
CREATININE: 4.24 mg/dL — AB (ref 0.61–1.24)
Calcium: 7 mg/dL — ABNORMAL LOW (ref 8.9–10.3)
Chloride: 109 mmol/L (ref 101–111)
GFR calc Af Amer: 15 mL/min — ABNORMAL LOW (ref >60)
GFR, EST NON AFRICAN AMERICAN: 13 mL/min — AB (ref >60)
Glucose, Bld: 115 mg/dL — ABNORMAL HIGH (ref 65–99)
PHOSPHORUS: 5.9 mg/dL — AB (ref 2.5–4.6)
Potassium: 4.2 mmol/L (ref 3.5–5.1)
SODIUM: 139 mmol/L (ref 135–145)

## 2015-07-04 LAB — PREPARE FRESH FROZEN PLASMA
UNIT DIVISION: 0
Unit division: 0
Unit division: 0
Unit division: 0

## 2015-07-04 LAB — BLOOD GAS, ARTERIAL
Acid-base deficit: 4.7 mmol/L — ABNORMAL HIGH (ref 0.0–2.0)
Bicarbonate: 20.7 mEq/L (ref 20.0–24.0)
Drawn by: 364961
FIO2: 0.4
MECHVT: 600 mL
O2 Saturation: 91.4 %
PATIENT TEMPERATURE: 99.7
PCO2 ART: 44.5 mmHg (ref 35.0–45.0)
PEEP: 5 cmH2O
PO2 ART: 72.1 mmHg — AB (ref 80.0–100.0)
RATE: 16 resp/min
TCO2: 22 mmol/L (ref 0–100)
pH, Arterial: 7.293 — ABNORMAL LOW (ref 7.350–7.450)

## 2015-07-04 LAB — GLUCOSE, CAPILLARY
GLUCOSE-CAPILLARY: 108 mg/dL — AB (ref 65–99)
GLUCOSE-CAPILLARY: 113 mg/dL — AB (ref 65–99)
GLUCOSE-CAPILLARY: 113 mg/dL — AB (ref 65–99)
GLUCOSE-CAPILLARY: 98 mg/dL (ref 65–99)
Glucose-Capillary: 107 mg/dL — ABNORMAL HIGH (ref 65–99)
Glucose-Capillary: 23 mg/dL — CL (ref 65–99)
Glucose-Capillary: 25 mg/dL — CL (ref 65–99)
Glucose-Capillary: 94 mg/dL (ref 65–99)

## 2015-07-04 LAB — CBC
HCT: 23 % — ABNORMAL LOW (ref 39.0–52.0)
Hemoglobin: 7.3 g/dL — ABNORMAL LOW (ref 13.0–17.0)
MCH: 31.3 pg (ref 26.0–34.0)
MCHC: 31.7 g/dL (ref 30.0–36.0)
MCV: 98.7 fL (ref 78.0–100.0)
PLATELETS: 106 10*3/uL — AB (ref 150–400)
RBC: 2.33 MIL/uL — ABNORMAL LOW (ref 4.22–5.81)
RDW: 15.8 % — AB (ref 11.5–15.5)
WBC: 8.8 10*3/uL (ref 4.0–10.5)

## 2015-07-04 LAB — HEMOGLOBIN AND HEMATOCRIT, BLOOD
HEMATOCRIT: 29.2 % — AB (ref 39.0–52.0)
Hemoglobin: 9.4 g/dL — ABNORMAL LOW (ref 13.0–17.0)

## 2015-07-04 LAB — MAGNESIUM: Magnesium: 1.7 mg/dL (ref 1.7–2.4)

## 2015-07-04 LAB — PREPARE RBC (CROSSMATCH)

## 2015-07-04 MED ORDER — AMIODARONE HCL IN DEXTROSE 360-4.14 MG/200ML-% IV SOLN
INTRAVENOUS | Status: AC
Start: 2015-07-04 — End: 2015-07-04
  Administered 2015-07-04: 60 mg/h via INTRAVENOUS
  Filled 2015-07-04: qty 200

## 2015-07-04 MED ORDER — AMIODARONE HCL IN DEXTROSE 360-4.14 MG/200ML-% IV SOLN: 30.0000 mg/h | INTRAVENOUS | Status: DC

## 2015-07-04 MED ORDER — AMIODARONE LOAD VIA INFUSION
150.0000 mg | Freq: Once | INTRAVENOUS | Status: AC
  Administered 2015-07-04: 150 mg via INTRAVENOUS
  Filled 2015-07-04: qty 83.34

## 2015-07-04 MED ORDER — PANTOPRAZOLE SODIUM 40 MG PO PACK
40.0000 mg | PACK | Freq: Every day | ORAL | Status: DC
  Administered 2015-07-04 – 2015-07-10 (×7): 40 mg
  Filled 2015-07-04 (×7): qty 20

## 2015-07-04 MED ORDER — DEXTROSE 50 % IV SOLN
1.0000 | Freq: Once | INTRAVENOUS | Status: DC
  Filled 2015-07-04 (×2): qty 50

## 2015-07-04 MED ORDER — BISACODYL 10 MG RE SUPP
10.0000 mg | Freq: Once | RECTAL | Status: AC
  Administered 2015-07-04: 10 mg via RECTAL
  Filled 2015-07-04: qty 1

## 2015-07-04 MED ORDER — AMIODARONE HCL IN DEXTROSE 360-4.14 MG/200ML-% IV SOLN
60.0000 mg/h | INTRAVENOUS | Status: DC
  Administered 2015-07-04 – 2015-07-05 (×2): 60 mg/h via INTRAVENOUS
  Filled 2015-07-04: qty 200

## 2015-07-04 MED ORDER — SODIUM CHLORIDE 0.9 % IV SOLN
Freq: Once | INTRAVENOUS | Status: AC
  Administered 2015-07-04: 08:00:00 via INTRAVENOUS

## 2015-07-04 NOTE — Progress Notes (Signed)
PULMONARY / CRITICAL CARE MEDICINE   Name: Jaci CarrelRalph C Mahmood MRN: [id number] DOB: 05/08/1945    ADMISSION DATE:  06/30/2015 CONSULTATION DATE:    REFERRING MD:  Dr. Darrick PennaFields  CHIEF COMPLAINT:  Vent Management   HISTORY OF PRESENT ILLNESS:   This is a 70 y.o. Male with a past medical history of Hypertension, CAD, AAA, High Cholesterol, Depression, Anxiety, Essential Tumor, Dementia, Asthma, COPD, former smoker, Anemia, MI, Pneumonia, and Stroke.  He was seen in February with CTA after his duplex revealed an increase in size from 4.8 cm to 5.2 cm.  The CTA revealed a 5.7 AAA.  On 5/11 he was told he was not a candidate for stent graft repair however he was a candidate for open repair.  He was taken to the OR on 6/6 for open AAA repair.  PCCM consulted on 6/6 for vent management  SUBJECTIVE:  Pt intubated and sedated  requiring low dose levophed to maintain map >65  VITAL SIGNS: BP 129/50 mmHg  Pulse 75  Temp(Src) 100.2 F (37.9 C) (Axillary)  Resp 17  Ht 5\' 11"  (1.803 m)  Wt 223 lb 15.8 oz (101.6 kg)  BMI 31.25 kg/m2  SpO2 100%  HEMODYNAMICS: CVP:  [6 mmHg-12 mmHg] 8 mmHg  VENTILATOR SETTINGS: Vent Mode:  [-] PRVC FiO2 (%):  [40 %] 40 % Set Rate:  [16 bmp] 16 bmp Vt Set:  [600 mL] 600 mL PEEP:  [5 cmH20] 5 cmH20 Plateau Pressure:  [22 cmH20-27 cmH20] 22 cmH20  INTAKE / OUTPUT:  Intake/Output Summary (Last 24 hours) at 07/04/15 1138 Last data filed at 07/04/15 1110  Gross per 24 hour  Intake 1333.8 ml  Output   2645 ml  Net -1311.2 ml     PHYSICAL EXAMINATION: General:  Ill appearing male intubated Neuro:  Sedated. Having spontaneous movement on all 4 extremities. Wakes to voice, not following commands  HEENT:  ETT, NG tube in place, supple, no JVD, nasal packing removed Cardiovascular:  S1s2, sinus r, 2+pulses in left foot, mottling on Left foot improved Lungs:  Diminished at bases, even, non -labored, on vent, triggers apnea alarm on PSV attempts Abdomen:No  bowel sounds, midline abdominal incision dressing dry and intact. Abd distended, bilateral bruising noted to lower abdomen Musculoskeletal:  Normal bulk Skin:  Midline abdominal incision dressing dry and intact, left medial thigh incision well approximated dermabond intact, slight discoloration on right nare where NGT is  LABS:  BMET  Recent Labs Lab 07/03/15 0305 07/03/15 1150 07/04/15 0355  NA 137 138 139  K 3.9 4.0 4.2  CL 108 110 109  CO2 21* 22 21*  BUN 46* 52* 65*  CREATININE 3.27* 3.53* 4.24*  GLUCOSE 125* 120* 115*    Electrolytes  Recent Labs Lab 07/18/2015 1530 07/02/15 0400  07/03/15 0305 07/03/15 1150 07/04/15 0355  CALCIUM 8.1* 8.6*  < > 7.5* 7.1* 7.0*  MG 1.7 1.7  --   --   --  1.7  PHOS  --   --   --  4.1  --  5.9*  < > = values in this interval not displayed.  CBC  Recent Labs Lab 07/03/15 0305 07/03/15 1150 07/04/15 0355  WBC 11.7* 9.9 8.8  HGB 7.9* 7.8* 7.3*  HCT 24.8* 24.5* 23.0*  PLT 112* 106* 106*    Coag's  Recent Labs Lab 07/09/2015 1830 07/02/2015 2110 07/02/15 0930 07/03/15 0305  APTT 86* 49* 26  --   INR 1.36 1.32 1.17 1.24    Sepsis  Markers  Recent Labs Lab 07/19/2015 2110 07/02/15 0400 07/03/15 0305  PROCALCITON 0.90 1.48 1.08    ABG  Recent Labs Lab 07/03/15 0308 07/03/15 1150 07/04/15 0314  PHART 7.404 7.407 7.293*  PCO2ART 33.7* 37.0 44.5  PO2ART 89.2 74.0* 72.1*    Liver Enzymes  Recent Labs Lab 07/02/15 0400 07/03/15 0305 07/04/15 0355  AST 190*  --   --   ALT 145*  --   --   ALKPHOS 35*  --   --   BILITOT 0.6  --   --   ALBUMIN 2.7* 2.4* 2.1*    Cardiac Enzymes No results for input(s): TROPONINI, PROBNP in the last 168 hours.  Glucose  Recent Labs Lab 07/03/15 1227 07/03/15 1230 07/03/15 1559 07/03/15 1911 07/04/15 0015 07/04/15 0401  GLUCAP 48* 113* 123* 121* 113* 107*    Imaging Dg Chest Port 1 View  07/04/2015  CLINICAL DATA:  Acute respiratory failure.  Intubated. EXAM:  PORTABLE CHEST 1 VIEW COMPARISON:  Chest radiograph from one day prior. FINDINGS: Endotracheal tube tip is 3.7 cm above the carina. Enteric tube enters stomach with the tip not seen on this image. Right internal jugular central venous catheter terminates in the upper third of the superior vena cava. Stable cardiomediastinal silhouette with mild cardiomegaly. No pneumothorax. Stable trace left pleural effusion. No right pleural effusion. No pulmonary edema. Stable bibasilar lung opacities. IMPRESSION: 1. Well-positioned support structures as described. 2. Stable bibasilar lung opacities, favor atelectasis . 3. Stable trace left pleural effusion. Electronically Signed   By: Delbert Phenix M.D.   On: 07/04/2015 07:47     STUDIES:  None  CULTURES: None  ANTIBIOTICS: Vancomycin 6/6>>  SIGNIFICANT EVENTS: 6/6-Pt hd open AAA repair remains intubated post surgery. Course c/b coagulopathy (likely dilutional), acute renal injury, shock.   LINES/TUBES: RIJ dual lumen CVC 6/6>> L radial aline 6/6>> PIV x1>> ETT 6/6>>  DISCUSSION: On 5/11 he was told he was not a candidate for stent graft repair however he was a candidate for open repair.  He was taken to the OR on 6/6 for open AAA repair.  PCCM consulted on 6/6 for vent management, shock. Continue to monitor renal function. Continues to produce urine, but creatinine continues to rise. Electrolytes ok. Would like to start weaning but currently he is over sedated. For today we will d/c sedating gtts, cont current IV lasix and cont supportive care. Hopefully he will be able to start weaning soon.    ASSESSMENT / PLAN:  PULMONARY A: Mechanically intubated post surgery Hx: COPD, former smoker  P:   Full vent support - attempt vent wean today continue to decrease sedation as tolerated  Vent Bundle Trend ABG's  Daily CXR  CARDIOVASCULAR A: Hypotension, AAA open repair, Sinus Bradycardia Hx: MI, Stroke, Heart Murmur, Anginal Pain, CAD, HTN P:   Vascular surgery following Titrate Levophed drip to maintain map >65 (currently off as of 6/9) Telemetry monitoring   RENAL A:   Acute Renal failure, ATN Creatinine continues to rise - 1.8 liters for last 24 hours 6/7 renal US for completeness - normal appearing kidneys (report above) Hyperkalemia P:   Renal following Monitor UOP Continue Lasix 160 BID per renal Replace electrolytes as indicated May need CVVH if K+ continues to rise   Continue to trend BMP  GASTROINTESTINAL A:   Hx:  Pyloric Spasms P:   resume trickle tube feeds at 10 ml/hr; adv as tolerated  PPI for PUP  HEMATOLOGIC A:   Anemia, hematuria,  Profuse nosebleed (nose packed per Vascular Surgery)-->packing now out  Drop in HgB over last 24 hours P:  Primary surgical team transfusing 2 unit PRBC per Nephrology recommendation Monitor s/sx for bleeding  INFECTIOUS A:   No problems identified Tmax 100.6 P:   Vancomycin post-op prophylaxis, will need to renal dose Trend WBC's 14->12.1--> 8.8   ENDOCRINE CBG (last 3)   A:   Type II DM P:   CBG monitoring q4hrs continue to trend with the initiation of TF SSI Hyper/Hypoglycemic protocol as indicated   NEUROLOGIC A:  Pain Hx: Depression, Anxiety, Essential tremor, Dementia P:   RASS goal: -1  Precedex off Fentanyl drip - off for ventilator wean Continue to use Versed PRN agitation   FAMILY  - Updates: Dr Delton Coombes updated pt's wife at bedside 6/7.   - Inter-disciplinary family meet or Palliative Care meeting due by: July 08, 2015  Jacqulyn Cane FNP, ACNP-S with Simonne Martinet ACNP-BC [facility] Pulmonary/Critical Care Pager # [phone] OR # [phone] if no answer  07/04/2015, 9:02 AM  Attending Note:  I have examined patient, reviewed labs, studies and notes. I have discussed the case with S Allred and Kreg Shropshire, and I agree with the data and plans as amended above.   70 yo man with COPD, hx CVA, now s/p open AAA repair 6/6. He remains  intubated, had post-op shock and was restarted transiently on norepi overnight, currently off. Course c/b ATN, improved UOP on lasix but no plateau in S Cr yet. Note drop Hgb last 24h, PRBC ordered this am and running now. On my eval he is comfortable on fentanyl gtt. Nasal packing removed. Lungs remains clear. He is apneic when I changed to PSV.  Suspect that his shock is due to active diuresis, anemia and sedation needs. Will continue WUA and SBT daily. Consider decrease lasix and follow to see if UOP continues post-ATN, will defer this to Renal. He may require CVVHD if S Cr continues to rise. Minimize sedation as able.   Independent critical care time is 40 minutes.   Levy Pupa, MD, PhD 07/04/2015, 11:53 AM [facility] [phone] or if no answer [phone]   .

## 2015-07-04 NOTE — Progress Notes (Signed)
S: intubated, sedated.  BP dropped yest and went back on levophed but now off O:BP 129/50 mmHg  Pulse 75  Temp(Src) 100.2 F (37.9 C) (Axillary)  Resp 17  Ht '5\' 11"'  (1.803 m)  Wt 101.6 kg (223 lb 15.8 oz)  BMI 31.25 kg/m2  SpO2 100%  Intake/Output Summary (Last 24 hours) at 07/04/15 0801 Last data filed at 07/04/15 0600  Gross per 24 hour  Intake 1083.8 ml  Output   2575 ml  Net -1491.2 ml   Weight change:  WUJ:[id number], sedated CVS:RRR Resp: Few basilar crackles ant Abd: No BS, + distention Ext: no edema.  Some ischemic changes to left gr toe NEURO:sedated   . sodium chloride  10 mL/hr Intravenous Once  . sodium chloride  10 mL/hr Intravenous Once  . sodium chloride   Intravenous Once  . antiseptic oral rinse  7 mL Mouth Rinse 10 times per day  . chlorhexidine gluconate (SAGE KIT)  15 mL Mouth Rinse BID  . dextrose  1 ampule Intravenous Once  . docusate sodium  100 mg Oral Daily  . famotidine (PEPCID) IV  20 mg Intravenous Q24H  . feeding supplement (PRO-STAT SUGAR FREE 64)  60 mL Per Tube BID  . furosemide  160 mg Intravenous BID  . insulin aspart  0-15 Units Subcutaneous Q4H  . mometasone-formoterol  2 puff Inhalation BID  . pantoprazole  40 mg Oral Daily   US Renal  07/02/2015  CLINICAL DATA:  Acute renal failure.  Diabetes mellitus EXAM: RENAL ULTRASOUND COMPARISON:  CT abdomen and pelvis February 26, 2015 FINDINGS: Right Kidney: Length: 12.6 cm. Echogenicity and renal cortical thickness are within normal limits. No mass, perinephric fluid, or hydronephrosis visualized. No sonographically demonstrable calculus or ureterectasis. Left Kidney: Length: 11.6 cm. Echogenicity and renal echogenicity are within normal limits. No mass, perinephric fluid, or hydronephrosis visualized. No sonographically demonstrable calculus or ureterectasis. Bladder: Urinary bladder is decompressed by Foley catheter. IMPRESSION: Normal appearing kidneys bilaterally by ultrasound. Electronically  Signed   By: Lowella Grip III M.D.   On: 07/02/2015 13:54   Dg Chest Port 1 View  07/04/2015  CLINICAL DATA:  Acute respiratory failure.  Intubated. EXAM: PORTABLE CHEST 1 VIEW COMPARISON:  Chest radiograph from one day prior. FINDINGS: Endotracheal tube tip is 3.7 cm above the carina. Enteric tube enters stomach with the tip not seen on this image. Right internal jugular central venous catheter terminates in the upper third of the superior vena cava. Stable cardiomediastinal silhouette with mild cardiomegaly. No pneumothorax. Stable trace left pleural effusion. No right pleural effusion. No pulmonary edema. Stable bibasilar lung opacities. IMPRESSION: 1. Well-positioned support structures as described. 2. Stable bibasilar lung opacities, favor atelectasis . 3. Stable trace left pleural effusion. Electronically Signed   By: Ilona Sorrel M.D.   On: 07/04/2015 07:47   Dg Chest Port 1 View  07/03/2015  CLINICAL DATA:  Endotracheal tube, followup, postop repair of abdominal aortic aneurysm EXAM: PORTABLE CHEST 1 VIEW COMPARISON:  Portable chest x-ray of 07/02/2015 and 07/25/2015 FINDINGS: The lungs are not well aerated with bibasilar opacities most consistent with atelectasis. Small effusions cannot be excluded. Cardiomegaly is stable. Endotracheal tube remains with the tip approximately 3.1 cm above the carina. Right IJ central venous line tip overlies the mid SVC and and NG tube is present. IMPRESSION: 1. Diminished aeration with increase in bibasilar opacities most consistent with atelectasis. 2. Tip of endotracheal tube approximately 3.1 cm above the carina. Electronically Signed   By: Eddie Dibbles  Alvester Chou M.D.   On: 07/03/2015 08:16   BMET    Component Value Date/Time   NA 139 07/04/2015 0355   NA 143 03/18/2014 1605   K 4.2 07/04/2015 0355   CL 109 07/04/2015 0355   CO2 21* 07/04/2015 0355   GLUCOSE 115* 07/04/2015 0355   GLUCOSE 93 03/18/2014 1605   BUN 65* 07/04/2015 0355   BUN 16 03/18/2014 1605    CREATININE 4.24* 07/04/2015 0355   CALCIUM 7.0* 07/04/2015 0355   GFRNONAA 13* 07/04/2015 0355   GFRAA 15* 07/04/2015 0355   CBC    Component Value Date/Time   WBC 8.8 07/04/2015 0355   WBC 6.9 03/18/2014 1605   RBC 2.33* 07/04/2015 0355   RBC 3.94* 03/18/2014 1605   HGB 7.3* 07/04/2015 0355   HCT 23.0* 07/04/2015 0355   PLT 106* 07/04/2015 0355   MCV 98.7 07/04/2015 0355   MCH 31.3 07/04/2015 0355   MCH 33.0 03/18/2014 1605   MCHC 31.7 07/04/2015 0355   MCHC 34.7 03/18/2014 1605   RDW 15.8* 07/04/2015 0355   RDW 14.3 03/18/2014 1605   LYMPHSABS 1.9 12/13/2014 1435   LYMPHSABS 1.9 03/18/2014 1605   MONOABS 0.5 12/13/2014 1435   EOSABS 0.2 12/13/2014 1435   EOSABS 0.3 03/18/2014 1605   BASOSABS 0.1 12/13/2014 1435   BASOSABS 0.0 03/18/2014 1605     Assessment: 1. 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 UO good, Scr still trending up with intermittent hypotension affecting recovery 2. SP AAA repair 3. Hx HTN 4. DM  Plan: 1. Cont with IV lasix  2. Daily labs 3. Spoke to [facility] PA and they plan to transfuse 1 unit blood    Rishik Tubby T

## 2015-07-04 NOTE — Progress Notes (Signed)
OT Cancellation Note  Patient Details Name: Wyatt CarrelRalph C Cooperman MRN: [id number] DOB: 12/08/1945   Cancelled Treatment:    Reason Eval/Treat Not Completed: Patient not medically ready - Will try back on Monday.    Angelene GiovanniConarpe, Randal Yepiz M  Qamar Rosman Belle[place]onarpe, OTR/L [phone]  07/04/2015, 11:56 AM

## 2015-07-04 NOTE — Progress Notes (Signed)
Dr. Myra GianottiBrabham ([facility]) paged and spoke with regarding pt conversion into rapid afib. Asked to touch base with CCM. Elink notified and spoke with. Orders received for amiodorone gtt. Will continue to monitor closely. Modena JanskyKevin Xareni Kelch RN

## 2015-07-04 NOTE — Progress Notes (Signed)
PT Cancellation Note  Patient Details Name: Wyatt CarrelRalph C Miller MRN: [id number] DOB: 01/28/1945   Cancelled Treatment:    Reason Eval/Treat Not Completed: Medical issues which prohibited therapy (Pt intubated and not ready for PT per nurse. Check back tomorrow.) thanks.    [name], [name] 07/04/2015, 1:03 PM Enter[employer] CorporationDawn Shiya Miller,PT Acute Rehabilitation [phone] (pager)

## 2015-07-04 NOTE — Progress Notes (Addendum)
AAA Progress Note    07/04/2015 7:47 AM 3 Days Post-Op  Subjective:  Intubated-weaning sedation and starting to wake up.  Tm 100.6 HR  60's-80's NSR 80's-130's systolic 100% .64QIH4   Gtts:  -Levophed was on last night from 1900-0200 and off since then -Fentanyl  ABx: -Vancomycin  Filed Vitals:   07/04/15 0630 07/04/15 0736  BP: 93/60   Pulse: 70   Temp:  100.2 F (37.9 C)  Resp: 17     Physical Exam: Cardiac:  regular Lungs:  Lungs sounds clear with slightly diminished BS at right base Abdomen:  Less firm than yesterday; distant bowel sounds; -BM Incisions:  Laparotomy incision is clean and dry with staples in tact with only some mild bloody drainage  Extremities:  +palpable pulses bilateral DP's with right > left; feet are slightly less dusky today.  CBC    Component Value Date/Time   WBC 8.8 07/04/2015 0355   WBC 6.9 03/18/2014 1605   RBC 2.33* 07/04/2015 0355   RBC 3.94* 03/18/2014 1605   HGB 7.3* 07/04/2015 0355   HCT 23.0* 07/04/2015 0355   PLT 106* 07/04/2015 0355   MCV 98.7 07/04/2015 0355   MCH 31.3 07/04/2015 0355   MCH 33.0 03/18/2014 1605   MCHC 31.7 07/04/2015 0355   MCHC 34.7 03/18/2014 1605   RDW 15.8* 07/04/2015 0355   RDW 14.3 03/18/2014 1605   LYMPHSABS 1.9 12/13/2014 1435   LYMPHSABS 1.9 03/18/2014 1605   MONOABS 0.5 12/13/2014 1435   EOSABS 0.2 12/13/2014 1435   EOSABS 0.3 03/18/2014 1605   BASOSABS 0.1 12/13/2014 1435   BASOSABS 0.0 03/18/2014 1605    BMET    Component Value Date/Time   NA 139 07/04/2015 0355   NA 143 03/18/2014 1605   K 4.2 07/04/2015 0355   CL 109 07/04/2015 0355   CO2 21* 07/04/2015 0355   GLUCOSE 115* 07/04/2015 0355   GLUCOSE 93 03/18/2014 1605   BUN 65* 07/04/2015 0355   BUN 16 03/18/2014 1605   CREATININE 4.24* 07/04/2015 0355   CALCIUM 7.0* 07/04/2015 0355   GFRNONAA 13* 07/04/2015 0355   GFRAA 15* 07/04/2015 0355    INR    Component Value Date/Time   INR 1.24 07/03/2015 0305      Intake/Output Summary (Last 24 hours) at 07/04/15 0747 Last data filed at 07/04/15 0600  Gross per 24 hour  Intake 1143.8 ml  Output   2925 ml  Net -1781.2 ml     Assessment/Plan:  70 y.o. 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  UOP dropped off last evening but has picked up -acute surgical blood loss anemia:  hgb 7.3, which is down from 7.8-no further hematuria or bleeding from nares. Will transfuse one unit PRBC's today -WBC is back to normal.  Nasal packing is out and therefore will discontinue vancomycin VDRF-wean as tolerated per CCM.  Pt is more alert and is following commands.  Hopefully can wean today. -pt does have palpable DP pulses bilaterally-feet still somewhat dusky but seems a little better today -DM:  Good control-BS this morning 108.  Continue SSI   Doreatha Massed, PA-C [facility] [phone] 07/04/2015 7:47 AM  Transfuse 2 units PRBC today for blood loss anemia Slow progress forward. Hopefully extubate today. If remains intubated ok with trickle  10cc/hr tube feeds but would not advance this until more evidence of gut function with BM and more hemodynamically stable.  Would advise against any tube feeds while on levophed.  Fabienne Brunsharles Athenia Rys, MD [facility] [place] Office: [phone] Pager: [phone]

## 2015-07-05 ENCOUNTER — Inpatient Hospital Stay (HOSPITAL_COMMUNITY): Payer: Medicare Other

## 2015-07-05 DIAGNOSIS — J810 Acute pulmonary edema: Secondary | ICD-10-CM

## 2015-07-05 DIAGNOSIS — Z9911 Dependence on respirator [ventilator] status: Secondary | ICD-10-CM

## 2015-07-05 LAB — TYPE AND SCREEN
ABO/RH(D): A POS
Antibody Screen: NEGATIVE
UNIT DIVISION: 0
UNIT DIVISION: 0
UNIT DIVISION: 0
UNIT DIVISION: 0
Unit division: 0
Unit division: 0
Unit division: 0
Unit division: 0

## 2015-07-05 LAB — BLOOD GAS, ARTERIAL
Acid-base deficit: 2.9 mmol/L — ABNORMAL HIGH (ref 0.0–2.0)
Bicarbonate: 21.1 mEq/L (ref 20.0–24.0)
DRAWN BY: 364961
FIO2: 0.4
MECHVT: 600 mL
O2 SAT: 93.6 %
PATIENT TEMPERATURE: 99.8
PCO2 ART: 35.5 mmHg (ref 35.0–45.0)
PEEP: 5 cmH2O
PH ART: 7.395 (ref 7.350–7.450)
PO2 ART: 76.4 mmHg — AB (ref 80.0–100.0)
RATE: 20 resp/min
TCO2: 22.1 mmol/L (ref 0–100)

## 2015-07-05 LAB — GLUCOSE, CAPILLARY
GLUCOSE-CAPILLARY: 115 mg/dL — AB (ref 65–99)
GLUCOSE-CAPILLARY: 141 mg/dL — AB (ref 65–99)
GLUCOSE-CAPILLARY: 121 mg/dL — AB (ref 65–99)
Glucose-Capillary: 125 mg/dL — ABNORMAL HIGH (ref 65–99)
Glucose-Capillary: 145 mg/dL — ABNORMAL HIGH (ref 65–99)
Glucose-Capillary: 131 mg/dL — ABNORMAL HIGH (ref 65–99)

## 2015-07-05 LAB — COMPREHENSIVE METABOLIC PANEL
ALK PHOS: 54 U/L (ref 38–126)
ALT: 148 U/L — AB (ref 17–63)
AST: 155 U/L — AB (ref 15–41)
Albumin: 2.2 g/dL — ABNORMAL LOW (ref 3.5–5.0)
Anion gap: 12 (ref 5–15)
BUN: 81 mg/dL — AB (ref 6–20)
CALCIUM: 7.2 mg/dL — AB (ref 8.9–10.3)
CO2: 21 mmol/L — AB (ref 22–32)
CREATININE: 4.17 mg/dL — AB (ref 0.61–1.24)
Chloride: 108 mmol/L (ref 101–111)
GFR calc non Af Amer: 13 mL/min — ABNORMAL LOW (ref >60)
GFR, EST AFRICAN AMERICAN: 15 mL/min — AB (ref >60)
GLUCOSE: 133 mg/dL — AB (ref 65–99)
Potassium: 3.5 mmol/L (ref 3.5–5.1)
SODIUM: 141 mmol/L (ref 135–145)
Total Bilirubin: 1.7 mg/dL — ABNORMAL HIGH (ref 0.3–1.2)
Total Protein: 5.3 g/dL — ABNORMAL LOW (ref 6.5–8.1)

## 2015-07-05 LAB — CBC
HCT: 28.4 % — ABNORMAL LOW (ref 39.0–52.0)
HEMOGLOBIN: 9.2 g/dL — AB (ref 13.0–17.0)
MCH: 29.4 pg (ref 26.0–34.0)
MCHC: 32.4 g/dL (ref 30.0–36.0)
MCV: 90.7 fL (ref 78.0–100.0)
Platelets: 120 10*3/uL — ABNORMAL LOW (ref 150–400)
RBC: 3.13 MIL/uL — AB (ref 4.22–5.81)
RDW: 17.3 % — ABNORMAL HIGH (ref 11.5–15.5)
WBC: 7.8 10*3/uL (ref 4.0–10.5)

## 2015-07-05 LAB — CALCIUM, IONIZED: CALCIUM, IONIZED, SERUM: 3.9 mg/dL — AB (ref 4.5–5.6)

## 2015-07-05 LAB — PHOSPHORUS: Phosphorus: 4.3 mg/dL (ref 2.5–4.6)

## 2015-07-05 LAB — MAGNESIUM: MAGNESIUM: 1.9 mg/dL (ref 1.7–2.4)

## 2015-07-05 MED ORDER — CETYLPYRIDINIUM CHLORIDE 0.05 % MT LIQD
7.0000 mL | Freq: Two times a day (BID) | OROMUCOSAL | Status: DC
  Administered 2015-07-05 – 2015-07-06 (×3): 7 mL via OROMUCOSAL

## 2015-07-05 MED ORDER — FUROSEMIDE 10 MG/ML IJ SOLN
80.0000 mg | Freq: Two times a day (BID) | INTRAMUSCULAR | Status: DC
  Administered 2015-07-05 – 2015-07-06 (×2): 80 mg via INTRAVENOUS
  Filled 2015-07-05 (×2): qty 8

## 2015-07-05 MED ORDER — CHLORHEXIDINE GLUCONATE 0.12 % MT SOLN
15.0000 mL | Freq: Two times a day (BID) | OROMUCOSAL | Status: DC
  Administered 2015-07-05 – 2015-07-06 (×2): 15 mL via OROMUCOSAL

## 2015-07-05 MED ORDER — FENTANYL CITRATE (PF) 100 MCG/2ML IJ SOLN
12.5000 ug | INTRAMUSCULAR | Status: DC | PRN
  Administered 2015-07-05 – 2015-07-09 (×6): 25 ug via INTRAVENOUS
  Filled 2015-07-05 (×7): qty 2

## 2015-07-05 MED ORDER — FENTANYL CITRATE (PF) 100 MCG/2ML IJ SOLN
25.0000 ug | INTRAMUSCULAR | Status: DC | PRN
  Administered 2015-07-05: 50 ug via INTRAVENOUS
  Filled 2015-07-05: qty 2

## 2015-07-05 MED ORDER — POTASSIUM CHLORIDE 10 MEQ/50ML IV SOLN
10.0000 meq | INTRAVENOUS | Status: AC
  Administered 2015-07-05 (×2): 10 meq via INTRAVENOUS
  Filled 2015-07-05 (×2): qty 50

## 2015-07-05 NOTE — Progress Notes (Signed)
PULMONARY / CRITICAL CARE MEDICINE   Name: [name] MRN: [id number] DOB: 1945-02-27    ADMISSION DATE:  07/19/2015 CONSULTATION DATE:  07/04/2015  REFERRING MD:  Dr. Darrick Penna  CHIEF COMPLAINT:  Vent Management   HISTORY OF PRESENT ILLNESS:   This is a 70 y.o. Male with a past medical history of Hypertension, CAD, AAA, High Cholesterol, Depression, Anxiety, Essential Tumor, Dementia, Asthma, COPD, former smoker, Anemia, MI, Pneumonia, and Stroke.  He was seen in February with CTA after his duplex revealed an increase in size from 4.8 cm to 5.2 cm.  The CTA revealed a 5.7 AAA.  On 5/11 he was told he was not a candidate for stent graft repair however he was a candidate for open repair.  He was taken to the OR on 6/6 for open AAA repair.  PCCM consulted on 6/6 for vent management  SUBJECTIVE:  Afib RVR overnight.  Started on amiodarone gtt, now NSR.  UOP much improved.  Weaned on PS 5/5 x several hours yesterday.   VITAL SIGNS: BP 116/68 mmHg  Pulse 66  Temp(Src) 99.8 F (37.7 C) (Oral)  Resp 20  Ht  (1.803 m)  Wt 100.1 kg (220 lb 10.9 oz)  BMI 30.79 kg/m2  SpO2 93%  HEMODYNAMICS: CVP:  [8 mmHg-15 mmHg] 15 mmHg  VENTILATOR SETTINGS: Vent Mode:  [-] PRVC FiO2 (%):  [40 %-50 %] 40 % Set Rate:  [20 bmp] 20 bmp Vt Set:  [600 mL] 600 mL PEEP:  [5 cmH20] 5 cmH20 Pressure Support:  [5 cmH20] 5 cmH20 Plateau Pressure:  [19 cmH20-23 cmH20] 19 cmH20  INTAKE / OUTPUT:  Intake/Output Summary (Last 24 hours) at 07/05/15 0825 Last data filed at 07/05/15 0400  Gross per 24 hour  Intake 1517.3 ml  Output   3780 ml  Net -2262.7 ml     PHYSICAL EXAMINATION: General:  Ill appearing male intubated, NAD  Neuro:  Sedated. RASS -1, opens eyes to voice, intermittent fentanyl  HEENT:  ETT, NG tube in place, supple, no JVD Cardiovascular:  S1s2, NSR 60's, 2+pulses in left foot, mottling on Left foot improved Lungs:  Diminished at bases, even, non -labored, on vent, triggers apnea  alarm on PSV attempts Abdomen:  Minimal bowel sounds, midline abdominal incision dressing dry and intact. Abd distended, bilateral bruising lower abdomen Musculoskeletal:  Normal bulk, no sig edema    LABS:  BMET  Recent Labs Lab 07/03/15 1150 07/04/15 0355 07/05/15 0315  NA 138 139 141  K 4.0 4.2 3.5  CL 110 109 108  CO2 22 21* 21*  BUN 52* 65* 81*  CREATININE 3.53* 4.24* 4.17*  GLUCOSE 120* 115* 133*    Electrolytes  Recent Labs Lab 07/02/15 0400  07/03/15 0305 07/03/15 1150 07/04/15 0355 07/05/15 0315  CALCIUM 8.6*  < > 7.5* 7.1* 7.0* 7.2*  MG 1.7  --   --   --  1.7 1.9  PHOS  --   --  4.1  --  5.9* 4.3  < > = values in this interval not displayed.  CBC  Recent Labs Lab 07/03/15 1150 07/04/15 0355 07/04/15 1954 07/05/15 0315  WBC 9.9 8.8  --  7.8  HGB 7.8* 7.3* 9.4* 9.2*  HCT 24.5* 23.0* 29.2* 28.4*  PLT 106* 106*  --  120*    Coag's  Recent Labs Lab 07/08/2015 1830 06/26/2015 2110 07/02/15 0930 07/03/15 0305  APTT 86* 49* 26  --   INR 1.36 1.32 1.17 1.24  Sepsis Markers  Recent Labs Lab 07/05/2015 2110 07/02/15 0400 07/03/15 0305  PROCALCITON 0.90 1.48 1.08    ABG  Recent Labs Lab 07/03/15 1150 07/04/15 0314 07/05/15 0355  PHART 7.407 7.293* 7.395  PCO2ART 37.0 44.5 35.5  PO2ART 74.0* 72.1* 76.4*    Liver Enzymes  Recent Labs Lab 07/02/15 0400 07/03/15 0305 07/04/15 0355 07/05/15 0315  AST 190*  --   --  155*  ALT 145*  --   --  148*  ALKPHOS 35*  --   --  54  BILITOT 0.6  --   --  1.7*  ALBUMIN 2.7* 2.4* 2.1* 2.2*    Cardiac Enzymes No results for input(s): TROPONINI, PROBNP in the last 168 hours.  Glucose  Recent Labs Lab 07/04/15 0740 07/04/15 1137 07/04/15 1551 07/04/15 2017 07/04/15 2322 07/05/15 0355  GLUCAP 108* 113* 94 98 125* 115*    Imaging Dg Chest Port 1 View  07/05/2015  CLINICAL DATA:  Acute respiratory failure EXAM: PORTABLE CHEST 1 VIEW COMPARISON:  July 04, 2015 FINDINGS: The ETT is  in good position. The NG tube terminates below today's film. A right IJ terminates in the SVC. No pneumothorax. Mild opacity in medial right lung base is probably atelectasis and vascular crowding. Suspected small effusion and underlying opacity in the left base. No other changes. IMPRESSION: Stable support apparatus. Stable opacity in the medial right lung base. Stable small effusion and underlying opacity suspected on the left. Electronically Signed   By: Gerome Sam III M.D   On: 07/05/2015 07:42     STUDIES:  Renal u/s 6/7>>> normal   CULTURES: None  ANTIBIOTICS: Vancomycin 6/6>>  SIGNIFICANT EVENTS: 6/6-Pt hd open AAA repair remains intubated post surgery. Course c/b coagulopathy (likely dilutional), acute renal injury, shock.   LINES/TUBES: RIJ dual lumen CVC 6/6>> L radial aline 6/6>> PIV x1>> ETT 6/6>>  DISCUSSION: On 5/11 he was told he was not a candidate for stent graft repair however he was a candidate for open repair.  He was taken to the OR on 6/6 for open AAA repair.  PCCM consulted on 6/6 for vent management, shock, AKI.  Renal function improving slowly, uop improving.  Continue lasix per renal.  Continue PS wean - may be able to extubate soon.       ASSESSMENT / PLAN:  PULMONARY A: Mechanically intubated post surgery Hx: COPD, former smoker  P:   PS wean as tol -- may be able to extubate today  Minimize sedation  Vent Bundle Daily CXR  CARDIOVASCULAR A: Hypotension, AAA open repair, Sinus Bradycardia Hx: MI, Stroke, Heart Murmur, Anginal Pain, CAD, HTN AFib with RVR -- 6/9 - quickly converted back to NSR.  P:  Vascular surgery following Will hold amio with bradycardia this am - was a very brief episode of AFib per nurse  Telemetry monitoring   RENAL A:   Acute Renal failure, ATN -- Scr seems to have peaked, now trending back down.  UOP picking up.   Hyperkalemia - resolved  P:   Renal following Monitor UOP Continue Lasix 160 BID per  renal Replace electrolytes as indicated Continue to trend BMP  GASTROINTESTINAL A:   Hx:  Pyloric Spasms P:   Cont trickle tube feeds at 10 ml/hr; adv as tolerated  PPI for PUP  HEMATOLOGIC A:   Anemia, hematuria, Profuse nosebleed (nose packed per Vascular Surgery)-->packing now out  P:  S/p 2 units PRBC 6/9 Monitor s/sx for bleeding F/u CBC   INFECTIOUS  A:   No active issue  P:   Trend WBC's, fever curve  VAP prevention    ENDOCRINE CBG (last 3)   A:   Type II DM P:   CBG monitoring q4hrs SSI Hyper/Hypoglycemic protocol as indicated    NEUROLOGIC A:  Pain Hx: Depression, Anxiety, Essential tremor, Dementia P:   RASS goal: 0  Precedex, fentanyl off  PRN fent     FAMILY  - Updates: no family available 136/10   - Inter-disciplinary family meet or Palliative Care meeting due by: July 08, 2015   Dirk DressKaty Cathalina Barcia, NP 07/05/2015  8:25 AM Pager: [phone] or [phone]   .

## 2015-07-05 NOTE — Progress Notes (Signed)
    Subjective  - POD #4  Went into rapid A. fib last night which converted with amiodarone drip Remains intubated but did tolerate vent weaning tube.  Were not started   Physical Exam:  Abdomen is full but not distended.  Incision is intact Palpable pedal pulses with mild mottling on plantar surface of the feet Intubated and sedated      Assessment/Plan:  POD #4  A. fib: The patient converted to sinus on amiodarone drip.  We'll continue Acute blood loss anemia: Appropriate response to transfusion.  No active bleeding GI: Okay to start trophic tube feeds at 10 cc an hour Pulmonary: Hopefully will be able to extubate soon GU: Creatinine peaked yesterday.  It is down to 4.1 today.  He has good urine output.  We'll keep Foley in place.  Nephrology following  Durene CalBrabham, [place] 07/05/2015 8:02 AM --  Filed Vitals:   07/05/15 0630 07/05/15 0700  BP:  116/68  Pulse: 72 66  Temp:    Resp: 22 20    Intake/Output Summary (Last 24 hours) at 07/05/15 0802 Last data filed at 07/05/15 0400  Gross per 24 hour  Intake 1517.3 ml  Output   3780 ml  Net -2262.7 ml     Laboratory CBC    Component Value Date/Time   WBC 7.8 07/05/2015 0315   WBC 6.9 03/18/2014 1605   HGB 9.2* 07/05/2015 0315   HCT 28.4* 07/05/2015 0315   PLT 120* 07/05/2015 0315    BMET    Component Value Date/Time   NA 141 07/05/2015 0315   NA 143 03/18/2014 1605   K 3.5 07/05/2015 0315   CL 108 07/05/2015 0315   CO2 21* 07/05/2015 0315   GLUCOSE 133* 07/05/2015 0315   GLUCOSE 93 03/18/2014 1605   BUN 81* 07/05/2015 0315   BUN 16 03/18/2014 1605   CREATININE 4.17* 07/05/2015 0315   CALCIUM 7.2* 07/05/2015 0315   GFRNONAA 13* 07/05/2015 0315   GFRAA 15* 07/05/2015 0315    COAG Lab Results  Component Value Date   INR 1.24 07/03/2015   INR 1.17 07/02/2015   INR 1.32 10/30/2015   No results found for: PTT  Antibiotics Anti-infectives    Start     Dose/Rate Route Frequency Ordered Stop   07/05/15 0600  vancomycin (VANCOCIN) 1,500 mg in sodium chloride 0.9 % 500 mL IVPB  Status:  Discontinued     1,500 mg 250 mL/hr over 120 Minutes Intravenous Every 48 hours 07/03/15 1326 07/04/15 0758   07/03/15 0600  vancomycin (VANCOCIN) 1,500 mg in sodium chloride 0.9 % 500 mL IVPB  Status:  Discontinued     1,500 mg 250 mL/hr over 120 Minutes Intravenous Every 24 hours 04/25/15 1937 07/03/15 1326   04/25/15 1900  vancomycin (VANCOCIN) IVPB 1000 mg/200 mL premix     1,000 mg 200 mL/hr over 60 Minutes Intravenous Every 12 hours 04/25/15 1426 07/02/15 0710   04/25/15 0630  vancomycin (VANCOCIN) IVPB 1000 mg/200 mL premix     1,000 mg 200 mL/hr over 60 Minutes Intravenous To ShortStay Surgical 06/30/15 1051 04/25/15 0810       V. Charlena CrossWells Dmari Schubring IV, M.D. [facility] [place] Office: [phone] Pager:  [phone]

## 2015-07-05 NOTE — Progress Notes (Signed)
S: intubated  Occasional low BP overnight O:BP 116/68 mmHg  Pulse 66  Temp(Src) 99.8 F (37.7 C) (Oral)  Resp 20  Ht '5\' 11"'  (1.803 m)  Wt 100.1 kg (220 lb 10.9 oz)  BMI 30.79 kg/m2  SpO2 93%  Intake/Output Summary (Last 24 hours) at 07/05/15 0817 Last data filed at 07/05/15 0400  Gross per 24 hour  Intake 1517.3 ml  Output   3780 ml  Net -2262.7 ml   Weight change: -2.9 kg (-6 lb 6.3 oz) KGS:UPJSRPRXY but awake.   CVS:RRR Resp: Few basilar crackles ant Abd: No BS, + distention Ext: no edema.  Some ischemic changes to left gr toe NEURO: follows commands   . sodium chloride  10 mL/hr Intravenous Once  . sodium chloride   Intravenous Once  . antiseptic oral rinse  7 mL Mouth Rinse 10 times per day  . chlorhexidine gluconate (SAGE KIT)  15 mL Mouth Rinse BID  . dextrose  1 ampule Intravenous Once  . docusate sodium  100 mg Oral Daily  . famotidine (PEPCID) IV  20 mg Intravenous Q24H  . feeding supplement (PRO-STAT SUGAR FREE 64)  60 mL Per Tube BID  . furosemide  160 mg Intravenous BID  . insulin aspart  0-15 Units Subcutaneous Q4H  . mometasone-formoterol  2 puff Inhalation BID  . pantoprazole sodium  40 mg Per Tube Daily   Dg Chest Port 1 View  07/05/2015  CLINICAL DATA:  Acute respiratory failure EXAM: PORTABLE CHEST 1 VIEW COMPARISON:  July 04, 2015 FINDINGS: The ETT is in good position. The NG tube terminates below today's film. A right IJ terminates in the SVC. No pneumothorax. Mild opacity in medial right lung base is probably atelectasis and vascular crowding. Suspected small effusion and underlying opacity in the left base. No other changes. IMPRESSION: Stable support apparatus. Stable opacity in the medial right lung base. Stable small effusion and underlying opacity suspected on the left. Electronically Signed   By: Dorise Bullion III M.D   On: 07/05/2015 07:42   Dg Chest Port 1 View  07/04/2015  CLINICAL DATA:  Acute respiratory failure.  Intubated. EXAM: PORTABLE  CHEST 1 VIEW COMPARISON:  Chest radiograph from one day prior. FINDINGS: Endotracheal tube tip is 3.7 cm above the carina. Enteric tube enters stomach with the tip not seen on this image. Right internal jugular central venous catheter terminates in the upper third of the superior vena cava. Stable cardiomediastinal silhouette with mild cardiomegaly. No pneumothorax. Stable trace left pleural effusion. No right pleural effusion. No pulmonary edema. Stable bibasilar lung opacities. IMPRESSION: 1. Well-positioned support structures as described. 2. Stable bibasilar lung opacities, favor atelectasis . 3. Stable trace left pleural effusion. Electronically Signed   By: Ilona Sorrel M.D.   On: 07/04/2015 07:47   BMET    Component Value Date/Time   NA 141 07/05/2015 0315   NA 143 03/18/2014 1605   K 3.5 07/05/2015 0315   CL 108 07/05/2015 0315   CO2 21* 07/05/2015 0315   GLUCOSE 133* 07/05/2015 0315   GLUCOSE 93 03/18/2014 1605   BUN 81* 07/05/2015 0315   BUN 16 03/18/2014 1605   CREATININE 4.17* 07/05/2015 0315   CALCIUM 7.2* 07/05/2015 0315   GFRNONAA 13* 07/05/2015 0315   GFRAA 15* 07/05/2015 0315   CBC    Component Value Date/Time   WBC 7.8 07/05/2015 0315   WBC 6.9 03/18/2014 1605   RBC 3.13* 07/05/2015 0315   RBC 3.94* 03/18/2014 1605  HGB 9.2* 07/05/2015 0315   HCT 28.4* 07/05/2015 0315   PLT 120* 07/05/2015 0315   MCV 90.7 07/05/2015 0315   MCH 29.4 07/05/2015 0315   MCH 33.0 03/18/2014 1605   MCHC 32.4 07/05/2015 0315   MCHC 34.7 03/18/2014 1605   RDW 17.3* 07/05/2015 0315   RDW 14.3 03/18/2014 1605   LYMPHSABS 1.9 12/13/2014 1435   LYMPHSABS 1.9 03/18/2014 1605   MONOABS 0.5 12/13/2014 1435   EOSABS 0.2 12/13/2014 1435   EOSABS 0.3 03/18/2014 1605   BASOSABS 0.1 12/13/2014 1435   BASOSABS 0.0 03/18/2014 1605     Assessment: 1. 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 UO excellent.  Scr  fractionally lower 2. SP AAA repair 3. Hx HTN 4. DM  Plan: 1. Decrease IV lasix 2. Give 2 runs IV K 3.  Hopefully can get extubated today 4. Recheck labs in AM   Jameire Kouba T

## 2015-07-05 NOTE — Procedures (Signed)
Extubation Procedure Note  Patient Details:   Name: Jaci CarrelRalph C Lefferts DOB: 05/26/1945 MRN: [id number]   Positive cuff leak.   Evaluation  O2 sats: stable throughout Complications: No apparent complications Patient did tolerate procedure well. Bilateral Breath Sounds: Diminished   Yes - pt able to vocalize after extubation.  Placed patient on 4 L [place]. Vital signs stable. Will cont to monitor   Berenice PrimasSharp, Ormand Senn S 07/05/2015, 4:19 PM

## 2015-07-06 DIAGNOSIS — F419 Anxiety disorder, unspecified: Secondary | ICD-10-CM

## 2015-07-06 DIAGNOSIS — I10 Essential (primary) hypertension: Secondary | ICD-10-CM

## 2015-07-06 DIAGNOSIS — R112 Nausea with vomiting, unspecified: Secondary | ICD-10-CM

## 2015-07-06 LAB — CBC
HEMATOCRIT: 31.2 % — AB (ref 39.0–52.0)
Hemoglobin: 9.8 g/dL — ABNORMAL LOW (ref 13.0–17.0)
MCH: 29.1 pg (ref 26.0–34.0)
MCHC: 31.4 g/dL (ref 30.0–36.0)
MCV: 92.6 fL (ref 78.0–100.0)
Platelets: 160 10*3/uL (ref 150–400)
RBC: 3.37 MIL/uL — ABNORMAL LOW (ref 4.22–5.81)
RDW: 17 % — AB (ref 11.5–15.5)
WBC: 8.7 10*3/uL (ref 4.0–10.5)

## 2015-07-06 LAB — GLUCOSE, CAPILLARY
GLUCOSE-CAPILLARY: 133 mg/dL — AB (ref 65–99)
GLUCOSE-CAPILLARY: 117 mg/dL — AB (ref 65–99)
GLUCOSE-CAPILLARY: 119 mg/dL — AB (ref 65–99)
Glucose-Capillary: 112 mg/dL — ABNORMAL HIGH (ref 65–99)
Glucose-Capillary: 161 mg/dL — ABNORMAL HIGH (ref 65–99)
Glucose-Capillary: 106 mg/dL — ABNORMAL HIGH (ref 65–99)

## 2015-07-06 LAB — RENAL FUNCTION PANEL
Albumin: 2.3 g/dL — ABNORMAL LOW (ref 3.5–5.0)
Anion gap: 12 (ref 5–15)
BUN: 90 mg/dL — AB (ref 6–20)
CHLORIDE: 109 mmol/L (ref 101–111)
CO2: 26 mmol/L (ref 22–32)
Calcium: 7.8 mg/dL — ABNORMAL LOW (ref 8.9–10.3)
Creatinine, Ser: 4 mg/dL — ABNORMAL HIGH (ref 0.61–1.24)
GFR calc Af Amer: 16 mL/min — ABNORMAL LOW (ref >60)
GFR calc non Af Amer: 14 mL/min — ABNORMAL LOW (ref >60)
GLUCOSE: 141 mg/dL — AB (ref 65–99)
POTASSIUM: 3.2 mmol/L — AB (ref 3.5–5.1)
Phosphorus: 5.2 mg/dL — ABNORMAL HIGH (ref 2.5–4.6)
Sodium: 147 mmol/L — ABNORMAL HIGH (ref 135–145)

## 2015-07-06 MED ORDER — SODIUM CHLORIDE 0.9 % IV SOLN: INTRAVENOUS | Status: DC

## 2015-07-06 MED ORDER — POTASSIUM CHLORIDE 10 MEQ/50ML IV SOLN
10.0000 meq | INTRAVENOUS | Status: AC
  Administered 2015-07-06 (×4): 10 meq via INTRAVENOUS
  Filled 2015-07-06 (×3): qty 50

## 2015-07-06 MED ORDER — INSULIN ASPART 100 UNIT/ML ~~LOC~~ SOLN: 0.0000 [IU] | Freq: Three times a day (TID) | SUBCUTANEOUS | Status: DC

## 2015-07-06 MED ORDER — INSULIN ASPART 100 UNIT/ML ~~LOC~~ SOLN
0.0000 [IU] | Freq: Three times a day (TID) | SUBCUTANEOUS | Status: DC
  Administered 2015-07-07 – 2015-07-08 (×5): 2 [IU] via SUBCUTANEOUS

## 2015-07-06 MED ORDER — LORAZEPAM 2 MG/ML IJ SOLN
0.5000 mg | Freq: Four times a day (QID) | INTRAMUSCULAR | Status: DC
  Administered 2015-07-06 – 2015-07-07 (×4): 0.5 mg via INTRAVENOUS
  Filled 2015-07-06 (×4): qty 1

## 2015-07-06 MED ORDER — METOPROLOL TARTRATE 5 MG/5ML IV SOLN: 2.5000 mg | Freq: Four times a day (QID) | INTRAVENOUS | Status: DC

## 2015-07-06 MED ORDER — METOPROLOL TARTRATE 25 MG/10 ML ORAL SUSPENSION
25.0000 mg | Freq: Two times a day (BID) | ORAL | Status: DC
  Administered 2015-07-06 – 2015-07-10 (×7): 25 mg
  Filled 2015-07-06 (×8): qty 10

## 2015-07-06 MED ORDER — SODIUM CHLORIDE 0.9 % IV SOLN: 10.0000 mL/h | INTRAVENOUS | Status: DC

## 2015-07-06 MED ORDER — METOPROLOL TARTRATE 25 MG/10 ML ORAL SUSPENSION
12.5000 mg | Freq: Two times a day (BID) | ORAL | Status: DC
  Administered 2015-07-06: 12.5 mg
  Filled 2015-07-06: qty 5

## 2015-07-06 NOTE — Progress Notes (Signed)
eLink Physician-Brief Progress Note Patient Name: Wyatt CarrelRalph C Miller DOB: 04/25/1945 MRN: [id number]   Date of Service  07/06/2015  HPI/Events of Note  Hypokalemia  eICU Interventions  Potassium replaced     Intervention Category Intermediate Interventions: Electrolyte abnormality - evaluation and management  Shadow Schedler 07/06/2015, 4:57 AM

## 2015-07-06 NOTE — Progress Notes (Signed)
S: Extubated O:BP 193/102 mmHg  Pulse 78  Temp(Src) 98.1 F (36.7 C) (Oral)  Resp 14  Ht 5\' 11"  (1.803 m)  Wt 99 kg (218 lb 4.1 oz)  BMI 30.45 kg/m2  SpO2 98%  Intake/Output Summary (Last 24 hours) at 07/06/15 0847 Last data filed at 07/06/15 [id number]  Gross per 24 hour  Intake  543.3 ml  Output   6080 ml  Net -5536.7 ml   Weight change: -1.1 kg (-2 lb 6.8 oz) RUE:AVWUJGen:awake and alert  CVS:RRR Resp: Decreased BS bases Abd: No BS, NTND Ext: no edema.   NEURO: follows commands Ox2  1967, Ardyth HarpsRonald Reagan Rt triple lumen   . sodium chloride  10 mL/hr Intravenous Once  . sodium chloride   Intravenous Once  . antiseptic oral rinse  7 mL Mouth Rinse q12n4p  . chlorhexidine  15 mL Mouth Rinse BID  . dextrose  1 ampule Intravenous Once  . docusate sodium  100 mg Oral Daily  . famotidine (PEPCID) IV  20 mg Intravenous Q24H  . feeding supplement (PRO-STAT SUGAR FREE 64)  60 mL Per Tube BID  . furosemide  80 mg Intravenous BID  . insulin aspart  0-15 Units Subcutaneous Q4H  . metoprolol tartrate  12.5 mg Per Tube BID  . mometasone-formoterol  2 puff Inhalation BID  . pantoprazole sodium  40 mg Per Tube Daily  . potassium chloride  10 mEq Intravenous Q1 Hr x 4   Dg Chest Port 1 View  07/05/2015  CLINICAL DATA:  Acute respiratory failure EXAM: PORTABLE CHEST 1 VIEW COMPARISON:  July 04, 2015 FINDINGS: The ETT is in good position. The NG tube terminates below today's film. A right IJ terminates in the SVC. No pneumothorax. Mild opacity in medial right lung base is probably atelectasis and vascular crowding. Suspected small effusion and underlying opacity in the left base. No other changes. IMPRESSION: Stable support apparatus. Stable opacity in the medial right lung base. Stable small effusion and underlying opacity suspected on the left. Electronically Signed   By: Gerome Samavid  Williams III M.D   On: 07/05/2015 07:42   Dg Abd Portable 1v  07/05/2015  CLINICAL DATA:  Enteric tube placement EXAM:  PORTABLE ABDOMEN - 1 VIEW COMPARISON:  Nov 18, 2015 abdominal radiograph FINDINGS: Enteric tube terminates in the proximal body of the stomach. Skin staples are seen in the midline abdomen. No dilated small bowel loops, pneumatosis or pneumoperitoneum. Mild bibasilar lung opacities. IMPRESSION: 1. Enteric tube terminates in the proximal body of the stomach. 2. Mild bibasilar lung opacities, favor atelectasis. Electronically Signed   By: Delbert PhenixJason A Poff M.D.   On: 07/05/2015 20:10   BMET    Component Value Date/Time   NA 147* 07/06/2015 0405   NA 143 03/18/2014 1605   K 3.2* 07/06/2015 0405   CL 109 07/06/2015 0405   CO2 26 07/06/2015 0405   GLUCOSE 141* 07/06/2015 0405   GLUCOSE 93 03/18/2014 1605   BUN 90* 07/06/2015 0405   BUN 16 03/18/2014 1605   CREATININE 4.00* 07/06/2015 0405   CALCIUM 7.8* 07/06/2015 0405   GFRNONAA 14* 07/06/2015 0405   GFRAA 16* 07/06/2015 0405   CBC    Component Value Date/Time   WBC 8.7 07/06/2015 0405   WBC 6.9 03/18/2014 1605   RBC 3.37* 07/06/2015 0405   RBC 3.94* 03/18/2014 1605   HGB 9.8* 07/06/2015 0405   HCT 31.2* 07/06/2015 0405   PLT 160 07/06/2015 0405   MCV 92.6 07/06/2015 0405   MCH 29.1  07/06/2015 0405   MCH 33.0 03/18/2014 1605   MCHC 31.4 07/06/2015 0405   MCHC 34.7 03/18/2014 1605   RDW 17.0* 07/06/2015 0405   RDW 14.3 03/18/2014 1605   LYMPHSABS 1.9 12/13/2014 1435   LYMPHSABS 1.9 03/18/2014 1605   MONOABS 0.5 12/13/2014 1435   EOSABS 0.2 12/13/2014 1435   EOSABS 0.3 03/18/2014 1605   BASOSABS 0.1 12/13/2014 1435   BASOSABS 0.0 03/18/2014 1605     Assessment: 1. 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 UO excellent.  Scr slowly decreasing. He may be getting a little intravascularly volume depleted so will DC lasix and give maintenance fluids 2. SP AAA repair 3. Hx HTN 4. DM 5. Hypokalemia  Plan: 1. DC lasix and use prn  2. IV fluids 3. Replace K as  you are doing 4. Daily labs  Bobbie Virden T

## 2015-07-06 NOTE — Progress Notes (Signed)
PULMONARY / CRITICAL CARE MEDICINE   Name: [name] MRN: [id number] DOB: 09-07-1945    ADMISSION DATE:  07/14/15 CONSULTATION DATE:  2015-07-14  REFERRING MD:  Dr. Darrick Penna  CHIEF COMPLAINT:  Vent Management   HISTORY OF PRESENT ILLNESS:   This is a 70 y.o. Male with a past medical history of Hypertension, CAD, AAA, High Cholesterol, Depression, Anxiety, Essential Tumor, Dementia, Asthma, COPD, former smoker, Anemia, MI, Pneumonia, and Stroke.  He was seen in February with CTA after his duplex revealed an increase in size from 4.8 cm to 5.2 cm.  The CTA revealed a 5.7 AAA.  On 5/11 he was told he was not a candidate for stent graft repair however he was a candidate for open repair.  He was taken to the OR on 6/6 for open AAA repair.  PCCM consulted on 6/6 for vent management  SUBJECTIVE:  Extubated yesterday.  Resp status stable.  Net NEG 1.2L.  Hypertensive.   VITAL SIGNS: BP 173/82 mmHg  Pulse 77  Temp(Src) 98.1 F (36.7 C) (Oral)  Resp 12  Ht  (1.803 m)  Wt 99 kg (218 lb 4.1 oz)  BMI 30.45 kg/m2  SpO2 100%  HEMODYNAMICS: CVP:  [7 mmHg-8 mmHg] 8 mmHg  VENTILATOR SETTINGS: Vent Mode:  [-] PSV;CPAP FiO2 (%):  [40 %] 40 % PEEP:  [5 cmH20] 5 cmH20 Pressure Support:  [5 cmH20] 5 cmH20  INTAKE / OUTPUT:  Intake/Output Summary (Last 24 hours) at 07/06/15 0828 Last data filed at 07/06/15 0640  Gross per 24 hour  Intake  423.3 ml  Output   6080 ml  Net -5656.7 ml     PHYSICAL EXAMINATION: General:  Ill appearing male, NAD  Neuro: awake, alert, intermittently confused, MAE, follows commands   HEENT:  Mm dry, no JVD, NG tube  Cardiovascular:  S1s2, NSR 80's, 2+pulses in left foot Lungs:  resps even non labored on [place], diminished bases Abdomen:  Minimal bowel sounds, midline abdominal incision dressing dry and intact. Abd distended,mildly tender  Musculoskeletal:  Normal bulk, no sig edema    LABS:  BMET  Recent Labs Lab 07/04/15 0355 07/05/15 0315  07/06/15 0405  NA 139 141 147*  K 4.2 3.5 3.2*  CL 109 108 109  CO2 21* 21* 26  BUN 65* 81* 90*  CREATININE 4.24* 4.17* 4.00*  GLUCOSE 115* 133* 141*    Electrolytes  Recent Labs Lab 07/02/15 0400  07/04/15 0355 07/05/15 0315 07/06/15 0405  CALCIUM 8.6*  < > 7.0* 7.2* 7.8*  MG 1.7  --  1.7 1.9  --   PHOS  --   < > 5.9* 4.3 5.2*  < > = values in this interval not displayed.  CBC  Recent Labs Lab 07/04/15 0355 07/04/15 1954 07/05/15 0315 07/06/15 0405  WBC 8.8  --  7.8 8.7  HGB 7.3* 9.4* 9.2* 9.8*  HCT 23.0* 29.2* 28.4* 31.2*  PLT 106*  --  120* 160    Coag's  Recent Labs Lab 07-14-15 1830 2015/07/14 2110 07/02/15 0930 07/03/15 0305  APTT 86* 49* 26  --   INR 1.36 1.32 1.17 1.24    Sepsis Markers  Recent Labs Lab 07-14-2015 2110 07/02/15 0400 07/03/15 0305  PROCALCITON 0.90 1.48 1.08    ABG  Recent Labs Lab 07/03/15 1150 07/04/15 0314 07/05/15 0355  PHART 7.407 7.293* 7.395  PCO2ART 37.0 44.5 35.5  PO2ART 74.0* 72.1* 76.4*    Liver Enzymes  Recent Labs Lab 07/02/15 0400  07/04/15  1610 07/05/15 0315 07/06/15 0405  AST 190*  --   --  155*  --   ALT 145*  --   --  148*  --   ALKPHOS 35*  --   --  54  --   BILITOT 0.6  --   --  1.7*  --   ALBUMIN 2.7*  < > 2.1* 2.2* 2.3*  < > = values in this interval not displayed.  Cardiac Enzymes No results for input(s): TROPONINI, PROBNP in the last 168 hours.  Glucose  Recent Labs Lab 07/05/15 0901 07/05/15 1250 07/05/15 1641 07/05/15 2024 07/05/15 2314 07/06/15 0336  GLUCAP 141* 145* 121* 131* 112* 133*    Imaging Dg Abd Portable 1v  07/05/2015  CLINICAL DATA:  Enteric tube placement EXAM: PORTABLE ABDOMEN - 1 VIEW COMPARISON:  2015/07/22 abdominal radiograph FINDINGS: Enteric tube terminates in the proximal body of the stomach. Skin staples are seen in the midline abdomen. No dilated small bowel loops, pneumatosis or pneumoperitoneum. Mild bibasilar lung opacities. IMPRESSION: 1.  Enteric tube terminates in the proximal body of the stomach. 2. Mild bibasilar lung opacities, favor atelectasis. Electronically Signed   By: Delbert Phenix M.D.   On: 07/05/2015 20:10     STUDIES:  Renal u/s 6/7>>> normal   CULTURES: None  ANTIBIOTICS: Vancomycin 6/6>>  SIGNIFICANT EVENTS: 6/6-Pt hd open AAA repair remains intubated post surgery. Course c/b coagulopathy (likely dilutional), acute renal injury, shock.   LINES/TUBES: RIJ dual lumen CVC 6/6>> L radial aline 6/6>> PIV x1>> ETT 6/6>>6/10  DISCUSSION: On 5/11 he was told he was not a candidate for stent graft repair however he was a candidate for open repair.  He was taken to the OR on 6/6 for open AAA repair.  PCCM consulted on 6/6 for vent management, shock, AKI.  Renal function improving slowly, uop improving.  Continue lasix per renal.  Continue PS wean - may be able to extubate soon.       ASSESSMENT / PLAN:  PULMONARY A: Mechanically intubated post surgery -- extubated 6/10 Hx: COPD, former smoker  P:   Pulmonary hygiene  Mobilize  F/u CXR  CARDIOVASCULAR A: Hypotension, AAA open repair, Sinus Bradycardia Hx: MI, Stroke, Heart Murmur, Anginal Pain, CAD, HTN AFib with RVR -- 6/9 - quickly converted back to NSR.  P:  Vascular surgery following Remains in NSR  Add metoprolol 12.5mg  per tube BID 6/11 with rising BP  Telemetry monitoring   RENAL A:   Acute Renal failure, ATN -- Scr seems to have peaked, now trending back down.  UOP picking up.   Hyperkalemia - resolved  Hypernatremia  Hypokalemia  P:   Renal following Monitor UOP Continue Lasix per renal Replace electrolytes as indicated Continue to trend BMP  GASTROINTESTINAL A:   Hx:  Pyloric Spasms Nausea/vomiting  P:   NPO for now per VVS  NG to suction  Will need swallow eval once ok with vascular for PO intake  PPI for PUP  HEMATOLOGIC A:   Anemia, hematuria, Profuse nosebleed (nose packed per Vascular Surgery)-->packing now out   P:  S/p 2 units PRBC 6/9 Monitor s/sx for bleeding F/u CBC   INFECTIOUS A:   No active issue  P:   Trend WBC's, fever curve  VAP prevention    ENDOCRINE Type II DM P:   CBG monitoring q4hrs SSI Hyper/Hypoglycemic protocol as indicated    NEUROLOGIC A:  Pain Hx: Depression, Anxiety, Essential tremor, Dementia P:   RASS goal: 0  PRN fent     FAMILY  - Updates: no family available 6/11   - Inter-disciplinary family meet or Palliative Care meeting due by: July 08, 2015   Dirk DressKaty Whiteheart, NP 07/06/2015  8:28 AM Pager: [phone] or [phone]   .

## 2015-07-06 NOTE — Progress Notes (Signed)
    Subjective  - POD #5  Extubated yesterday Wants to get cleaned up No pain Denies flatus  Physical Exam:  abd full but not distended Non-tender abdomen Palpable pedal pulses Mildly confused       Assessment/Plan:  POD #6  GI:  Will leave NG in until passing flatus.  It was a difficult placement. KUB yesterday confirmed that it remains in stomach.  OK for ice chips.  May need swallow study GU:  Still with good UOP.  Cr slightly improved today.  Keep foley in to monitor UOP Heme:  Hb stable Prophylaxis:  SCD's.  Will add lovenox today.  Protonix for GI Needs to get OOB to chair today.  Keep in ICU ID:  No infectious issues    Durene CalBrabham, Wells 07/06/2015 7:43 AM --  Filed Vitals:   07/06/15 0500 07/06/15 0600  BP: 150/80 173/82  Pulse: 77 77  Temp:    Resp: 19 12    Intake/Output Summary (Last 24 hours) at 07/06/15 0743 Last data filed at 07/06/15 0640  Gross per 24 hour  Intake  486.6 ml  Output   6405 ml  Net -5918.4 ml     Laboratory CBC    Component Value Date/Time   WBC 8.7 07/06/2015 0405   WBC 6.9 03/18/2014 1605   HGB 9.8* 07/06/2015 0405   HCT 31.2* 07/06/2015 0405   PLT 160 07/06/2015 0405    BMET    Component Value Date/Time   NA 147* 07/06/2015 0405   NA 143 03/18/2014 1605   K 3.2* 07/06/2015 0405   CL 109 07/06/2015 0405   CO2 26 07/06/2015 0405   GLUCOSE 141* 07/06/2015 0405   GLUCOSE 93 03/18/2014 1605   BUN 90* 07/06/2015 0405   BUN 16 03/18/2014 1605   CREATININE 4.00* 07/06/2015 0405   CALCIUM 7.8* 07/06/2015 0405   GFRNONAA 14* 07/06/2015 0405   GFRAA 16* 07/06/2015 0405    COAG Lab Results  Component Value Date   INR 1.24 07/03/2015   INR 1.17 07/02/2015   INR 1.32 11-08-15   No results found for: PTT  Antibiotics Anti-infectives    Start     Dose/Rate Route Frequency Ordered Stop   07/05/15 0600  vancomycin (VANCOCIN) 1,500 mg in sodium chloride 0.9 % 500 mL IVPB  Status:  Discontinued     1,500  mg 250 mL/hr over 120 Minutes Intravenous Every 48 hours 07/03/15 1326 07/04/15 0758   07/03/15 0600  vancomycin (VANCOCIN) 1,500 mg in sodium chloride 0.9 % 500 mL IVPB  Status:  Discontinued     1,500 mg 250 mL/hr over 120 Minutes Intravenous Every 24 hours 07/13/15 1937 07/03/15 1326   07/13/15 1900  vancomycin (VANCOCIN) IVPB 1000 mg/200 mL premix     1,000 mg 200 mL/hr over 60 Minutes Intravenous Every 12 hours 07/13/15 1426 07/02/15 0710   07/13/15 0630  vancomycin (VANCOCIN) IVPB 1000 mg/200 mL premix     1,000 mg 200 mL/hr over 60 Minutes Intravenous To ShortStay Surgical 06/30/15 1051 07/13/15 0810       V. Charlena CrossWells Brabham IV, M.D. [facility] Office: [phone] Pager:  [phone]

## 2015-07-06 NOTE — Progress Notes (Signed)
Physical Therapy Evaluation Patient Details Name: [name] MRN: [id number] DOB: 09-Jun-1945 Today's Date: 07/06/2015   History of Present Illness  This is a 70 y.o. Male with a past medical history of Hypertension, CAD, AAA, High Cholesterol, Depression, Anxiety, Essential Tumor, Dementia, Asthma, COPD, former smoker, Anemia, MI, Pneumonia, and Stroke. He was seen in February with CTA after his duplex revealed an increase in size from 4.8 cm to 5.2 cm. The CTA revealed a 5.7 AAA. On 5/11 he was told he was not a candidate for stent graft repair however he was a candidate for open repair. He was taken to the OR on 6/6 for open AAA repair. PCCM consulted on 6/6 for vent managementExtubated 07/05/15.   Clinical Impression  Pt admitted with above diagnosis. Pt currently with functional limitations due to the deficits listed below (see PT Problem List). Pt was able to stand pivot to recliner but needed max assist of 2 persons with pt confused and unable to assist a lot with transfer.  Pt struggling to stay awake and could not express his thoughts well.  Seemed to have difficulty following commands and seemed internally distracted.  Wife cannot assist pt at home therefore will need SNF with therapy prior to d/c home. Pt will benefit from skilled PT to increase their independence and safety with mobility to allow discharge to the venue listed below.      Follow Up Recommendations SNF;Supervision/Assistance - 24 hour    Equipment Recommendations  Other (comment) (TBA)    Recommendations for Other Services       Precautions / Restrictions Precautions Precautions: Sternal;Fall Restrictions Weight Bearing Restrictions: No      Mobility  Bed Mobility Overal bed mobility: Needs Assistance;+2 for physical assistance Bed Mobility: Supine to Sit     Supine to sit: Mod assist;+2 for physical assistance;Max assist     General bed mobility comments: Needed assist for LEs and elevation of  trunk.   Transfers Overall transfer level: Needs assistance Equipment used: 2 person hand held assist Transfers: Sit to/from [employer] Sit to Stand: Max assist;+2 physical assistance;From elevated surface   Squat pivot transfers: Max assist;+2 physical assistance     General transfer comment: Pt seemed to have difficulty maintaining strength to stand up as he would come up and buckle, stand up and buckle.  Needed constant cues but even then could not maintain standing more than a second.  Incr assist to get pt into chair.  Nurse aware to lift pt back with maxisky.   Ambulation/Gait                Stairs            Wheelchair Mobility    Modified Rankin (Stroke Patients Only)       Balance Overall balance assessment: Needs assistance;History of Falls Sitting-balance support: Bilateral upper extremity supported;Feet supported Sitting balance-Leahy Scale: Zero Sitting balance - Comments: Pt having incr difficulty to sit on EOB.  Pt losing balance all directions.  Pt reaching for bed and really struggling to sit up.  Postural control: Posterior lean;Right lateral lean;Left lateral lean (anterior lean) Standing balance support: Bilateral upper extremity supported;During functional activity Standing balance-Leahy Scale: Zero Standing balance comment: unable to achieve full upright position                             Pertinent Vitals/Pain Pain Assessment: Faces Faces Pain Scale: Hurts even more Pain  Location: incision Pain Descriptors / Indicators: Grimacing;Guarding;Operative site guarding Pain Intervention(s): Limited activity within patient's tolerance;Monitored during session;Repositioned  95% on 4LO2.  Other VSS.     Home Living Family/patient expects to be discharged to:: Private residence Living Arrangements: Alone Available Help at Discharge: Family;Available 24 hours/day Type of Home: Apartment Home Access: Level entry      Home Layout: One level Home Equipment: Shower seat      Prior Function Level of Independence: Independent               Hand Dominance        Extremity/Trunk Assessment   Upper Extremity Assessment: Defer to OT evaluation           Lower Extremity Assessment: Generalized weakness      Cervical / Trunk Assessment: Kyphotic  Communication   Communication: No difficulties  Cognition Arousal/Alertness: Lethargic Behavior During Therapy: Anxious Overall Cognitive Status: Impaired/Different from baseline Area of Impairment: Orientation;Following commands;Safety/judgement;Memory;Awareness;Problem solving Orientation Level: Disoriented to;Time;Situation   Memory: Decreased short-term memory Following Commands: Follows one step commands inconsistently;Follows one step commands with increased time Safety/Judgement: Decreased awareness of safety;Decreased awareness of deficits Awareness: Intellectual Problem Solving: Difficulty sequencing;Requires verbal cues;Requires tactile cues;Decreased initiation      General Comments      Exercises        Assessment/Plan    PT Assessment Patient needs continued PT services  PT Diagnosis Generalized weakness   PT Problem List Decreased activity tolerance;Decreased balance;Decreased mobility;Decreased knowledge of use of DME;Decreased safety awareness;Decreased knowledge of precautions;Impaired sensation;Pain  PT Treatment Interventions DME instruction;Gait training;Functional mobility training;Therapeutic activities;Therapeutic exercise;Balance training;Patient/family education   PT Goals (Current goals can be found in the Care Plan section) Acute Rehab PT Goals Patient Stated Goal: to go home PT Goal Formulation: With patient Time For Goal Achievement: 07/20/15 Potential to Achieve Goals: Good    Frequency Min 3X/week   Barriers to discharge Decreased caregiver support (wife says pt needs to be independent)       Co-evaluation               End of Session Equipment Utilized During Treatment: Gait belt;Oxygen Activity Tolerance: Patient limited by fatigue Patient left: in chair;with call bell/phone within reach;with chair alarm set;with family/visitor present Nurse Communication: Mobility status;Need for lift equipment         Time: 1134-1157 PT Time Calculation (min) (ACUTE ONLY): 23 min   Charges:   PT Evaluation $PT Eval Moderate Complexity: 1 Procedure PT Treatments $Therapeutic Activity: 8-22 mins   PT G CodesBerline Lopes:        Cayce Quezada F 07/06/2015, 1:57 PM Raelynne Ludwick,PT Acute Rehabilitation [phone] (pager)

## 2015-07-07 DIAGNOSIS — R0902 Hypoxemia: Secondary | ICD-10-CM

## 2015-07-07 LAB — RENAL FUNCTION PANEL
ANION GAP: 11 (ref 5–15)
Albumin: 2.3 g/dL — ABNORMAL LOW (ref 3.5–5.0)
BUN: 87 mg/dL — ABNORMAL HIGH (ref 6–20)
CALCIUM: 8.2 mg/dL — AB (ref 8.9–10.3)
CO2: 28 mmol/L (ref 22–32)
Chloride: 115 mmol/L — ABNORMAL HIGH (ref 101–111)
Creatinine, Ser: 3.21 mg/dL — ABNORMAL HIGH (ref 0.61–1.24)
GFR calc non Af Amer: 18 mL/min — ABNORMAL LOW (ref >60)
GFR, EST AFRICAN AMERICAN: 21 mL/min — AB (ref >60)
Glucose, Bld: 133 mg/dL — ABNORMAL HIGH (ref 65–99)
POTASSIUM: 3.1 mmol/L — AB (ref 3.5–5.1)
Phosphorus: 4.7 mg/dL — ABNORMAL HIGH (ref 2.5–4.6)
SODIUM: 154 mmol/L — AB (ref 135–145)

## 2015-07-07 LAB — GLUCOSE, CAPILLARY
GLUCOSE-CAPILLARY: 140 mg/dL — AB (ref 65–99)
GLUCOSE-CAPILLARY: 131 mg/dL — AB (ref 65–99)
GLUCOSE-CAPILLARY: 146 mg/dL — AB (ref 65–99)
Glucose-Capillary: 105 mg/dL — ABNORMAL HIGH (ref 65–99)

## 2015-07-07 LAB — CBC
HEMATOCRIT: 32.8 % — AB (ref 39.0–52.0)
HEMOGLOBIN: 10.3 g/dL — AB (ref 13.0–17.0)
MCH: 29.4 pg (ref 26.0–34.0)
MCHC: 31.4 g/dL (ref 30.0–36.0)
MCV: 93.7 fL (ref 78.0–100.0)
Platelets: 227 10*3/uL (ref 150–400)
RBC: 3.5 MIL/uL — AB (ref 4.22–5.81)
RDW: 16.8 % — ABNORMAL HIGH (ref 11.5–15.5)
WBC: 11.4 10*3/uL — ABNORMAL HIGH (ref 4.0–10.5)

## 2015-07-07 LAB — PROCALCITONIN: Procalcitonin: 0.4 ng/mL

## 2015-07-07 MED ORDER — POTASSIUM CHLORIDE 10 MEQ/50ML IV SOLN
INTRAVENOUS | Status: AC
  Administered 2015-07-07: 10:00:00
  Filled 2015-07-07: qty 150

## 2015-07-07 MED ORDER — DEXTROSE 5 % IV SOLN
INTRAVENOUS | Status: AC
  Administered 2015-07-07 – 2015-07-08 (×3): via INTRAVENOUS

## 2015-07-07 MED ORDER — CETYLPYRIDINIUM CHLORIDE 0.05 % MT LIQD
7.0000 mL | Freq: Two times a day (BID) | OROMUCOSAL | Status: DC
  Administered 2015-07-07 – 2015-07-09 (×5): 7 mL via OROMUCOSAL

## 2015-07-07 MED ORDER — HEPARIN SODIUM (PORCINE) 5000 UNIT/ML IJ SOLN
5000.0000 [IU] | Freq: Three times a day (TID) | INTRAMUSCULAR | Status: DC
  Administered 2015-07-07 – 2015-07-08 (×3): 5000 [IU] via SUBCUTANEOUS
  Filled 2015-07-07 (×3): qty 1

## 2015-07-07 MED ORDER — POTASSIUM CHLORIDE 10 MEQ/50ML IV SOLN
10.0000 meq | INTRAVENOUS | Status: AC
  Administered 2015-07-07 (×3): 10 meq via INTRAVENOUS

## 2015-07-07 MED ORDER — LORAZEPAM 2 MG/ML IJ SOLN
0.5000 mg | Freq: Four times a day (QID) | INTRAMUSCULAR | Status: DC | PRN
  Administered 2015-07-07 – 2015-07-09 (×3): 0.5 mg via INTRAVENOUS
  Filled 2015-07-07 (×3): qty 1

## 2015-07-07 MED ORDER — SODIUM CHLORIDE 0.45 % IV SOLN
INTRAVENOUS | Status: DC
  Administered 2015-07-07: 08:00:00 via INTRAVENOUS

## 2015-07-07 MED ORDER — HYDRALAZINE HCL 20 MG/ML IJ SOLN
10.0000 mg | INTRAMUSCULAR | Status: DC | PRN
  Administered 2015-07-07: 10 mg via INTRAVENOUS
  Filled 2015-07-07: qty 1

## 2015-07-07 MED ORDER — CHLORHEXIDINE GLUCONATE 0.12 % MT SOLN
15.0000 mL | Freq: Two times a day (BID) | OROMUCOSAL | Status: DC
  Administered 2015-07-07 – 2015-07-09 (×5): 15 mL via OROMUCOSAL
  Filled 2015-07-07 (×3): qty 15

## 2015-07-07 NOTE — Progress Notes (Signed)
[facility] ROUNDING NOTE   Subjective:   Interval History:  A little confused this morning  Sat up patient in bed   Objective:  Vital signs in last 24 hours:  Temp:  [97.7 F (36.5 C)-99.1 F (37.3 C)] 98.3 F (36.8 C) (06/12 0748) Pulse Rate:  [63-95] 88 (06/12 1000) Resp:  [11-28] 16 (06/12 1000) BP: (105-187)/(62-109) 139/95 mmHg (06/12 1000) SpO2:  [88 %-99 %] 95 % (06/12 1000) Weight:  [93 kg (205 lb 0.4 oz)] 93 kg (205 lb 0.4 oz) (06/12 0400)  Weight change: -6 kg (-13 lb 3.7 oz) Filed Weights   07/05/15 0326 07/06/15 0300 07/07/15 0400  Weight: 100.1 kg (220 lb 10.9 oz) 99 kg (218 lb 4.1 oz) 93 kg (205 lb 0.4 oz)    Intake/Output: I/O last 3 completed shifts: In: 2160 [I.V.:1780; NG/GT:180; IV Piggyback:200] Out: 7540 [Urine:7040; Emesis/NG output:500]   Intake/Output this shift:  Total I/O In: 225 [I.V.:225] Out: 475 [Urine:475]  ZOX:WRUEAGen:awake and alert  CVS:RRR Resp: Decreased BS bases Abd: No BS, NTND Ext: no edema.  NEURO: follows commands Ox2 Kathaleen Grinder1968, Ronald Reagan Rt triple lumen   Basic Metabolic Panel:  Recent Labs Lab 04-22-2015 1530 07/02/15 0400  07/03/15 0305 07/03/15 1150 07/04/15 0355 07/05/15 0315 07/06/15 0405 07/07/15 0417  NA 137 141  < > 137 138 139 141 147* 154*  K 5.8* 5.5*  < > 3.9 4.0 4.2 3.5 3.2* 3.1*  CL 113* 112*  < > 108 110 109 108 109 115*  CO2 18* 22  < > 21* 22 21* 21* 26 28  GLUCOSE 214* 131*  < > 125* 120* 115* 133* 141* 133*  BUN 22* 33*  < > 46* 52* 65* 81* 90* 87*  CREATININE 1.75* 2.46*  < > 3.27* 3.53* 4.24* 4.17* 4.00* 3.21*  CALCIUM 8.1* 8.6*  < > 7.5* 7.1* 7.0* 7.2* 7.8* 8.2*  MG 1.7 1.7  --   --   --  1.7 1.9  --   --   PHOS  --   --   --  4.1  --  5.9* 4.3 5.2* 4.7*  < > = values in this interval not displayed.  Liver Function Tests:  Recent Labs Lab 07/02/15 0400 07/03/15 0305 07/04/15 0355 07/05/15 0315 07/06/15 0405 07/07/15 0417  AST 190*  --   --  155*  --   --   ALT 145*   --   --  148*  --   --   ALKPHOS 35*  --   --  54  --   --   BILITOT 0.6  --   --  1.7*  --   --   PROT 4.8*  --   --  5.3*  --   --   ALBUMIN 2.7* 2.4* 2.1* 2.2* 2.3* 2.3*    Recent Labs Lab 07/02/15 0400  AMYLASE 50   No results for input(s): AMMONIA in the last 168 hours.  CBC:  Recent Labs Lab 07/03/15 1150 07/04/15 0355 07/04/15 1954 07/05/15 0315 07/06/15 0405 07/07/15 0417  WBC 9.9 8.8  --  7.8 8.7 11.4*  HGB 7.8* 7.3* 9.4* 9.2* 9.8* 10.3*  HCT 24.5* 23.0* 29.2* 28.4* 31.2* 32.8*  MCV 96.1 98.7  --  90.7 92.6 93.7  PLT 106* 106*  --  120* 160 227    Cardiac Enzymes: No results for input(s): CKTOTAL, CKMB, CKMBINDEX, TROPONINI in the last 168 hours.  BNP: Invalid input(s): POCBNP  CBG:  Recent Labs Lab  07/06/15 0910 07/06/15 1239 07/06/15 1645 07/06/15 2136 07/07/15 0745  GLUCAP 117* 161* 106* 119* 105*    Microbiology: Results for orders placed or performed during the hospital encounter of 06/25/15  Surgical pcr screen     Status: None   Collection Time: 06/25/15  1:38 PM  Result Value Ref Range Status   MRSA, PCR NEGATIVE NEGATIVE Final   Staphylococcus aureus NEGATIVE NEGATIVE Final    Comment:        The Xpert SA Assay (FDA approved for NASAL specimens in patients over 66 years of age), is one component of a comprehensive surveillance program.  Test performance has been validated by [facility] for patients greater than or equal to 26 year old. It is not intended to diagnose infection nor to guide or monitor treatment.     Coagulation Studies: No results for input(s): LABPROT, INR in the last 72 hours.  Urinalysis: No results for input(s): COLORURINE, LABSPEC, PHURINE, GLUCOSEU, HGBUR, BILIRUBINUR, KETONESUR, PROTEINUR, UROBILINOGEN, NITRITE, LEUKOCYTESUR in the last 72 hours.  Invalid input(s): APPERANCEUR    Imaging: Dg Abd Portable 1v  07/05/2015  CLINICAL DATA:  Enteric tube placement EXAM: PORTABLE ABDOMEN - 1 VIEW  COMPARISON:  July 17, 2015 abdominal radiograph FINDINGS: Enteric tube terminates in the proximal body of the stomach. Skin staples are seen in the midline abdomen. No dilated small bowel loops, pneumatosis or pneumoperitoneum. Mild bibasilar lung opacities. IMPRESSION: 1. Enteric tube terminates in the proximal body of the stomach. 2. Mild bibasilar lung opacities, favor atelectasis. Electronically Signed   By: Delbert Phenix M.D.   On: 07/05/2015 20:10     Medications:   . sodium chloride 75 mL/hr at 07/07/15 1000   . antiseptic oral rinse  7 mL Mouth Rinse q12n4p  . chlorhexidine  15 mL Mouth Rinse BID  . dextrose  1 ampule Intravenous Once  . docusate sodium  100 mg Oral Daily  . famotidine (PEPCID) IV  20 mg Intravenous Q24H  . feeding supplement (PRO-STAT SUGAR FREE 64)  60 mL Per Tube BID  . heparin subcutaneous  5,000 Units Subcutaneous Q8H  . insulin aspart  0-15 Units Subcutaneous TID WC & HS  . metoprolol tartrate  25 mg Per Tube BID  . mometasone-formoterol  2 puff Inhalation BID  . pantoprazole sodium  40 mg Per Tube Daily  . potassium chloride  10 mEq Intravenous Q1 Hr x 3  . potassium chloride       acetaminophen **OR** acetaminophen, albuterol, bisacodyl, fentaNYL (SUBLIMAZE) injection, guaiFENesin-dextromethorphan, LORazepam, magnesium sulfate 1 - 4 g bolus IVPB, ondansetron, phenol  Assessment/ Plan:  1. 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 UO excellent. Scr slowly decreasing.  2. SP AAA repair 3. Hx HTN 4. DM 5. Hypokalemia  Replete IV KCL  6. Hyponatremia will give IV free water  -- large deficit    LOS: 6 Aniello Christopoulos W  :23 AM

## 2015-07-07 NOTE — Care Management Important Message (Signed)
Important Message  Patient Details  Name: [name] MRN: [id number] Date of Birth: 11/29/1945   Medicare Important Message Given:  Yes    Kyla BalzarineShealy, Makinzy Cleere Abena 07/07/2015, 10:23 AM

## 2015-07-07 NOTE — Progress Notes (Signed)
Patient has been very restless this shift and trying to get up out of bed his bed pressure has ben fluctuating  on and off prn given as  ordered

## 2015-07-07 NOTE — Progress Notes (Signed)
Nutrition Follow-up  DOCUMENTATION CODES:   Not applicable  INTERVENTION:  Re-estimated needs Monitor for diet advancement and return of gut function. Will supplement diet as advanced.    NUTRITION DIAGNOSIS:   Inadequate oral intake related to inability to eat as evidenced by NPO status.  Ongoing   GOAL:   Patient will meet greater than or equal to 90% of their needs  Meeting  MONITOR:   Diet advancement, Labs, Weight trends, Skin, I & O's  ASSESSMENT:   70 yo Male admitted 06/30/15 for elective repair of AAA. The aorta was clamped above the renal arteries and post op he has had issues with hypotension with SBP in the 70's. Pre-OP Scr 06/25/15 was 1.32, 6/617 Scr was 1.75 and today 2.46. UO only 560cc yest and only 35cc so far today. Hx of HTN for >1426yrs and DM x 7-8253yrs per wife. CVP 20. Bp has improved now and is in low 100's on levophed gtt.  6/6 open AAA repair 6/8 Tube feeding for ~1 hour; NPO/inadequate nutrition support x 6 days  6/10 Pt extubated   Weight since admission 213 lbs-->205 lbs; 3.8% weight loss x 1 week, severe weight loss for time frame. Pt is -3.7 L fluid since admission. May account for some weight loss.   Pt groggy and unable to wake him at time of visit. Pt remains NPO, waiting on diet advancement. NG output 450 ml 6/11. Per MD, pt still has ileus, abdomen distended, no real gut function.   Spoke to [employer]. Provide supplements when diet is advanced.    Labs reviewed; CBGs 106-161, Na 154, K 3.1, Cl 115, BUN 87, creat 3.21, Ca 8.2, GFR 18, phos 4.7.  Meds reviewed; KCl (d/c today), D5 @ 1400ml/hr (408 kcals)  Diet Order:  Diet NPO time specified  Skin:  Wound (see comment) (Stg I pu Nare)  Last BM:  6/10  Height:   Ht Readings from Last 1 Encounters:  2015-08-22 5\' 11"  (1.803 m)    Weight:   Wt Readings from Last 1 Encounters:  07/07/15 205 lb 0.4 oz (93 kg)    Ideal Body Weight:  78 kg  BMI:  Body mass index is 28.61  kg/(m^2).  Estimated Nutritional Needs:   Kcal:  1800-2000  Protein:  95-105 g  Fluid:  2 L  EDUCATION NEEDS:   No education needs identified at this time  Beryle QuantMeredith Ily Denno, MS [facility] Dietetic Intern Pager [phone]'

## 2015-07-07 NOTE — Progress Notes (Signed)
PULMONARY / CRITICAL CARE MEDICINE   Name: [name] MRN: [id number] DOB: [birth year]    ADMISSION DATE:  Jul 09, 2015 CONSULTATION DATE:  2015-07-09  REFERRING MD:  Dr. Darrick Penna  CHIEF COMPLAINT:  Vent Management   HISTORY OF PRESENT ILLNESS:  70 y.o. Male with a past medical history of Hypertension, CAD, AAA, High Cholesterol, Depression, Anxiety, Essential Tumor, Dementia, Asthma, COPD, former smoker, Anemia, MI, Pneumonia, and Stroke.  He was seen in February with CTA after his duplex revealed an increase in size from 4.8 cm to 5.2 cm.  The CTA revealed a 5.7 AAA.  On 5/11 he was told he was not a candidate for stent graft repair however he was a candidate for open repair.  He was taken to the OR on 6/6 for open AAA repair.  PCCM consulted on 6/6 for vent management  SUBJECTIVE:  Extubated yesterday.  Resp status stable.   Hypertensive SBP 116-187. Intermittent confusion  VITAL SIGNS: BP 145/92 mmHg  Pulse 65  Temp(Src) 98.1 F (36.7 C) (Oral)  Resp 16  Ht 5\' 11"  (1.803 m)  Wt 205 lb 0.4 oz (93 kg)  BMI 28.61 kg/m2  SpO2 100%  HEMODYNAMICS: CVP:  [5 mmHg-11 mmHg] 11 mmHg  VENTILATOR SETTINGS:    INTAKE / OUTPUT:  Intake/Output Summary (Last 24 hours) at 07/07/15 1430 Last data filed at 07/07/15 1106  Gross per 24 hour  Intake   1690 ml  Output   2790 ml  Net  -1100 ml     PHYSICAL EXAMINATION: General:  Ill appearing male, NAD, sleeping in recliner  Neuro: awake, alert, intermittently confused, MAE, follows commands   HEENT:  Mm dry, no JVD, NG tube  Cardiovascular:  S1s2, NSR 80's, 2+pulses in left foot Lungs:  resps even non labored on [place], lungs bilaterally clear  Abdomen:  Minimal bowel sounds, midline abdominal incision dressing dry and intact. Abd distended,mildly tender, multiple bruises  Musculoskeletal:  Normal bulk, no sig edema    LABS:  BMET  Recent Labs Lab 07/05/15 0315 07/06/15 0405 07/07/15 0417  NA 141 147* 154*  K 3.5 3.2* 3.1*  CL  108 109 115*  CO2 21* 26 28  BUN 81* 90* 87*  CREATININE 4.17* 4.00* 3.21*  GLUCOSE 133* 141* 133*    Electrolytes  Recent Labs Lab 07/02/15 0400  07/04/15 0355 07/05/15 0315 07/06/15 0405 07/07/15 0417  CALCIUM 8.6*  < > 7.0* 7.2* 7.8* 8.2*  MG 1.7  --  1.7 1.9  --   --   PHOS  --   < > 5.9* 4.3 5.2* 4.7*  < > = values in this interval not displayed.  CBC  Recent Labs Lab 07/05/15 0315 07/06/15 0405 07/07/15 0417  WBC 7.8 8.7 11.4*  HGB 9.2* 9.8* 10.3*  HCT 28.4* 31.2* 32.8*  PLT 120* 160 227    Coag's  Recent Labs Lab 07/09/2015 1830 07-09-15 2110 07/02/15 0930 07/03/15 0305  APTT 86* 49* 26  --   INR 1.36 1.32 1.17 1.24    Sepsis Markers  Recent Labs Lab 07/09/15 2110 07/02/15 0400 07/03/15 0305  PROCALCITON 0.90 1.48 1.08    ABG  Recent Labs Lab 07/03/15 1150 07/04/15 0314 07/05/15 0355  PHART 7.407 7.293* 7.395  PCO2ART 37.0 44.5 35.5  PO2ART 74.0* 72.1* 76.4*    Liver Enzymes  Recent Labs Lab 07/02/15 0400  07/05/15 0315 07/06/15 0405 07/07/15 0417  AST 190*  --  155*  --   --   ALT 145*  --  148*  --   --   ALKPHOS 35*  --  54  --   --   BILITOT 0.6  --  1.7*  --   --   ALBUMIN 2.7*  < > 2.2* 2.3* 2.3*  < > = values in this interval not displayed.  Cardiac Enzymes No results for input(s): TROPONINI, PROBNP in the last 168 hours.  Glucose  Recent Labs Lab 07/06/15 0910 07/06/15 1239 07/06/15 1645 07/06/15 2136 07/07/15 0745 07/07/15 1214  GLUCAP 117* 161* 106* 119* 105* 140*    Imaging No results found.   STUDIES:  Renal u/s 6/7>>> normal   CULTURES: None  ANTIBIOTICS: Vancomycin 6/6>>  SIGNIFICANT EVENTS: 6/6-Pt hd open AAA repair remains intubated post surgery. Course c/b coagulopathy (likely dilutional), acute renal injury, shock.   LINES/TUBES: RIJ dual lumen CVC 6/6>> L radial aline 6/6>> PIV x1>> ETT 6/6>>6/10  DISCUSSION: On 5/11 he was told he was not a candidate for stent graft  repair however he was a candidate for open repair.  He was taken to the OR on 6/6 for open AAA repair.  PCCM consulted on 6/6 for vent management, shock, AKI.  Renal function improving slowly, uop improving.  Continue lasix per renal.  Continue PS wean - may be able to extubate soon.       ASSESSMENT / PLAN:  PULMONARY A: Mechanically intubated post surgery -- extubated 6/10 Hx: COPD, former smoker  P:   Pulmonary hygiene - IS Mobilize  F/u CXR Dulera BID, Albuterol PRN  Wean O2 for sats   CARDIOVASCULAR A:  Hypotension, AAA open repair, Sinus Bradycardia Hx: MI, Stroke, Heart Murmur, Anginal Pain, CAD, HTN AFib with RVR -- 6/9 - quickly converted back to NSR.  P:  Vascular surgery following Metoprolol 25 mg per tube BID  Telemetry monitoring  PRN hydralazine for SBP >170  RENAL A:   Acute Renal failure, ATN - Scr seems to have peaked, now trending back down.  UOP picking up.   Hyperkalemia - resolved  Hypernatremia  Hypokalemia  P:   Renal following Monitor UOP Lasix d/c'd DW5 @ 75 ml/hr given hypernatremia  Replace electrolytes as indicated Continue to trend BMP Assess Mg given persistent hypokalemia  GASTROINTESTINAL A:   At Risk Protein Calorie Malnutrition - in setting of critical illness Hx: Pyloric Spasms Nausea/vomiting  P:   NPO for now per VVS  NG to suction  Will need swallow eval once ok with vascular for PO intake  PPI for PUP Dulcolax PRN, colace  HEMATOLOGIC A:   Anemia - s/p PRBC x2 units 6/9 Hematuria Epistaxis - profuse nosebleed (nose packed per Vascular Surgery)-->packing now out  P:  Monitor s/sx for bleeding F/u CBC  Heparin for DVT prophylaxis   INFECTIOUS A:   Mild Leukocytosis  P:   Trend WBC's, fever curve  VAP prevention  Consider d/c central line  Assess PCT    ENDOCRINE Type II DM P:   CBG monitoring q4hrs SSI Hyper/Hypoglycemic protocol as indicated    NEUROLOGIC A:   Pain Hx: Depression, Anxiety,  Essential tremor, Dementia P:   RASS goal: 0  PRN fent  Minimize sedating meds as able    FAMILY  - Updates: no family available 6/12  - Inter-disciplinary family meet or Palliative Care meeting due by: July 08, 2015   Canary Brim, NP-C [facility] Pgr: [phone] or if no answer [phone] 07/07/2015, 2:30 PM  Attending Note:  70 year old male with  PMH above presenting with respiratory failure post AAA repair and subsequent ATN that is improving.  On exam, patient is arousable but lethargic.  I reviewed CXR myself, no acute disease.  Discussed with PCCM-NP and bedside RN.  Respiratory failure:  - Monitor for airway protection.  Hypoxemia:  - Titrate O2 for sat of 88-92%.  - May need ambulatory desat study prior to discharge.  Anemia:  - CBC in AM.  - Transfuse per ICU protocol.  Hypertension:  - Hydralazine.  - Monitor.  Acute renal failure:  - NS 75 ml/hr.  - BMET in AM.  - Replace electrolytes as indicated.  Patient seen and examined, agree with above note.  I dictated the care and orders written for this patient under my direction.  Alyson ReedyWesam G Claus Silvestro, MD [phone]

## 2015-07-07 NOTE — Progress Notes (Signed)
Correction in spelling from earlier note his blood pressure has been fluctuating  And medication as ordered was given

## 2015-07-07 NOTE — Progress Notes (Addendum)
AAA Progress Note    07/07/2015 7:19 AM 6 Days Post-Op  Subjective:  Very groggy & confused this morning, but follows commands  Afebrile HR  60's-80's NSR 120's-180's systolic 92% 4LO2NC (extubated Saturday afternoon)  Gtts:  None  ABx:  none  Filed Vitals:   07/07/15 0430 07/07/15 0440  BP:    Pulse: 85 87  Temp:    Resp: 20 12    Physical Exam: Cardiac:  regular Lungs:  Clear at bases bilaterally Abdomen:  Soft, NT/ND; -BS; -BM in several days Incisions:  Midline incision is clean and dry; left below knee incision is clean and dry Extremities:  Easily palpable DP pulses bilaterally; duskiness of both feet resolved.  Motor in tact. Neuro:  Confused and not oriented to place, year, or president.  He does follow commands and moves all extremities.   CBC    Component Value Date/Time   WBC 11.4* 07/07/2015 0417   WBC 6.9 03/18/2014 1605   RBC 3.50* 07/07/2015 0417   RBC 3.94* 03/18/2014 1605   HGB 10.3* 07/07/2015 0417   HCT 32.8* 07/07/2015 0417   PLT 227 07/07/2015 0417   MCV 93.7 07/07/2015 0417   MCH 29.4 07/07/2015 0417   MCH 33.0 03/18/2014 1605   MCHC 31.4 07/07/2015 0417   MCHC 34.7 03/18/2014 1605   RDW 16.8* 07/07/2015 0417   RDW 14.3 03/18/2014 1605   LYMPHSABS 1.9 12/13/2014 1435   LYMPHSABS 1.9 03/18/2014 1605   MONOABS 0.5 12/13/2014 1435   EOSABS 0.2 12/13/2014 1435   EOSABS 0.3 03/18/2014 1605   BASOSABS 0.1 12/13/2014 1435   BASOSABS 0.0 03/18/2014 1605    BMET    Component Value Date/Time   NA 154* 07/07/2015 0417   NA 143 03/18/2014 1605   K 3.1* 07/07/2015 0417   CL 115* 07/07/2015 0417   CO2 28 07/07/2015 0417   GLUCOSE 133* 07/07/2015 0417   GLUCOSE 93 03/18/2014 1605   BUN 87* 07/07/2015 0417   BUN 16 03/18/2014 1605   CREATININE 3.21* 07/07/2015 0417   CALCIUM 8.2* 07/07/2015 0417   GFRNONAA 18* 07/07/2015 0417   GFRAA 21* 07/07/2015 0417    INR    Component Value Date/Time   INR 1.24 07/03/2015 0305      Intake/Output Summary (Last 24 hours) at 07/07/15 0719 Last data filed at 07/07/15 0600  Gross per 24 hour  Intake   1775 ml  Output   4165 ml  Net  -2390 ml    NGT output:  400-450cc overnight   Assessment/Plan:  70 y.o. male is s/p  Repair of juxtarenal abdominal aortic aneurysm (16 mm Dacron graft), repair of inferior vena cava, left popliteal and tibial embolectomy 6 Days Post-Op  -ARF improving-good UOP and creatinine down to 3.21 from 4.0 (yesterday); appreciate renal's assistance.  Lasix discontinued and IVF started (0.9% NaCl @ 75cc/hr) -acute blood loss anemia is stable -VDRF-extubated Saturday afternoon -abdomen is soft, but decreased/absent BS-will give another dulcolax today.  NGT put out 400cc overnight (minimal irrigation) -easily palpable DP pulses.  Duskiness of both feet has resolved. -confusion-spoke with RN and she states when the pt is awake, he is alert and there is no problems.  Ativan ordered for scheduled q6h--will change this to prn. -PT tried to work with pt yesterday and was very difficult and needed lift to put back in bed from chair.  Try to mobilize pt more today.  Recommending SNF at this time. -DVT prophylaxis:  Lovenox added yesterday, but I  do not see this in the orders-will add SQ heparin tid -WBC starting to trend upward (remains afebrile)-continue to monitor-double lumen right IJ has been in for 6 days-may need to remove or replace.  Foley in for 5 days.  Continue to monitor -DM-continues to be well controlled -hypernatremia - change IVF to 1/2 NaCl hypokalemia will defer to renal given renal failure  Doreatha Massed, PA-C [facility] [phone] 07/07/2015 7:19 AM  History and exam details as above Alert and oriented x 3 on my exam but very weak overall Still with Ileus, NG 500 yesterday, abdomen distended, no real gut function currently, Hopefully NG out soon Renal failure resolving.  Hypernatremia and hypokalemia  being followed by nephrology. Appreciate there input  Minimal leukocytosis trend for now Will need Rehab most likely PT/OT as able   Fabienne Bruns, MD [facility] Office: [phone] Pager: [phone]

## 2015-07-07 NOTE — Progress Notes (Signed)
Physical Therapy Treatment Patient Details Name: Wyatt CarrelRalph C Creger MRN: [id number] DOB: 06/03/1945 Today's Date: 07/07/2015    History of Present Illness This is a 70 y.o. Male with a past medical history of Hypertension, CAD, AAA, High Cholesterol, Depression, Anxiety, Essential Tumor, Dementia, Asthma, COPD, former smoker, Anemia, MI, Pneumonia, and Stroke. He was seen in February with CTA after his duplex revealed an increase in size from 4.8 cm to 5.2 cm. The CTA revealed a 5.7 AAA. On 5/11 he was told he was not a candidate for stent graft repair however he was a candidate for open repair. He was taken to the OR on 6/6 for open AAA repair. PCCM consulted on 6/6 for vent managementExtubated 07/05/15.     PT Comments    Unable to stand with patient due to lethargy, confusion, and weakness. Pt with limited participation due to the lethargy and confusion.  Follow Up Recommendations  SNF     Equipment Recommendations  Other (comment) (TBA)    Recommendations for Other Services       Precautions / Restrictions Precautions Precautions: Sternal;Fall Restrictions Weight Bearing Restrictions: No    Mobility  Bed Mobility                  Transfers                 General transfer comment: Attempted to stand with Corene CorneaSara Stedy from recliner but pt unable to assist. +2 total assist to bring trunk forward from back of chair. Unable to attempt stand due to poor trunk control and lethargy.   Ambulation/Gait                 Stairs            Wheelchair Mobility    Modified Rankin (Stroke Patients Only)       Balance Overall balance assessment: Needs assistance Sitting-balance support: No upper extremity supported;Feet supported Sitting balance-Leahy Scale: Zero Sitting balance - Comments: +2 total assist to keep pt sitting at edge of chair. Postural control: Posterior lean                          Cognition Arousal/Alertness:  Lethargic Behavior During Therapy: Flat affect Overall Cognitive Status: Impaired/Different from baseline Area of Impairment: Orientation;Following commands;Safety/judgement;Memory;Awareness;Problem solving Orientation Level: Disoriented to;Time;Situation;Place   Memory: Decreased short-term memory;Decreased recall of precautions Following Commands:  (Not following commands) Safety/Judgement: Decreased awareness of safety;Decreased awareness of deficits Awareness: Intellectual Problem Solving: Difficulty sequencing;Requires verbal cues;Requires tactile cues;Decreased initiation;Slow processing      Exercises      General Comments        Pertinent Vitals/Pain Pain Assessment: Faces Faces Pain Scale: No hurt    Home Living                      Prior Function            PT Goals (current goals can now be found in the care plan section) Progress towards PT goals: Not progressing toward goals - comment    Frequency  Min 3X/week    PT Plan Current plan remains appropriate    Co-evaluation             End of Session Equipment Utilized During Treatment: Oxygen Activity Tolerance: Patient limited by fatigue;Patient limited by lethargy Patient left: in chair;with call bell/phone within reach     Time: [id number] PT Time Calculation (min) (ACUTE  ONLY): 11 min  Charges:  $Therapeutic Activity: 8-22 mins                    G Codes:      Tailey Top 13-Jul-2015, 1:40 PM [facility] PT [phone]

## 2015-07-07 NOTE — Evaluation (Addendum)
Occupational Therapy Evaluation Patient Details Name: [name] MRN: [id number] DOB: September 11, 1945 Today's Date: 07/07/2015    History of Present Illness This is a 70 y.o. Male with a past medical history of Hypertension, CAD, AAA, High Cholesterol, Depression, Anxiety, Essential Tumor, Dementia, Asthma, COPD, former smoker, Anemia, MI, Pneumonia, and Stroke. He was seen in February with CTA after his duplex revealed an increase in size from 4.8 cm to 5.2 cm. The CTA revealed a 5.7 AAA. On 5/11 he was told he was not a candidate for stent graft repair however he was a candidate for open repair. He was taken to the OR on 6/6 for open AAA repair. PCCM consulted on 6/6 for vent managementExtubated 07/05/15.    Clinical Impression   Pt admitted with above. He demonstrates the below listed deficits and will benefit from continued OT to maximize safety and independence with BADLs.  Pt very restless.  Follows one step motor commands inconsistently.  Unable to engage in ADL tasks.  Max A for bed mobiltiy.  He will likely require SNF level rehab at discharge.       Follow Up Recommendations  SNF;Supervision/Assistance - 24 hour    Equipment Recommendations  None recommended by OT    Recommendations for Other Services       Precautions / Restrictions Precautions Precautions: Sternal;Fall      Mobility Bed Mobility Overal bed mobility: Needs Assistance Bed Mobility: Rolling Rolling: Max assist         General bed mobility comments: Pt able to assist minimally   Transfers                      Balance                                            ADL Overall ADL's : Needs assistance/impaired Eating/Feeding: NPO   Grooming: Wash/dry hands;Wash/dry face;Total assistance   Upper Body Bathing: Total assistance   Lower Body Bathing: Total assistance   Upper Body Dressing : Total assistance   Lower Body Dressing: Total assistance   Toilet  Transfer: Total assistance   Toileting- Clothing Manipulation and Hygiene: Total assistance       Functional mobility during ADLs: Total assistance General ADL Comments: Pt unable to engage in ADL activity at this time      Vision     Perception     Praxis      Pertinent Vitals/Pain Pain Assessment: Faces Faces Pain Scale: No hurt     Hand Dominance     Extremity/Trunk Assessment Upper Extremity Assessment Upper Extremity Assessment: Generalized weakness   Lower Extremity Assessment Lower Extremity Assessment: Defer to PT evaluation       Communication Communication Communication: No difficulties   Cognition Arousal/Alertness: Lethargic Behavior During Therapy: Flat affect Overall Cognitive Status: Impaired/Different from baseline Area of Impairment: Orientation;Attention;Following commands Orientation Level: Disoriented to;Time;Situation;Place Current Attention Level: Focused   Following Commands: Follows one step commands inconsistently;Follows one step commands with increased time       General Comments: Pt restless.  Able to state his name, and follow one step motor commands inconsistently    General Comments       Exercises       Shoulder Instructions      Home Living Family/patient expects to be discharged to:: Private residence Living Arrangements: Alone Available Help  at Discharge: Family;Available 24 hours/day Type of Home: Apartment Home Access: Level entry     Home Layout: One level     Bathroom Shower/Tub: Chief Strategy OfficerTub/shower unit   Bathroom Toilet: Standard Bathroom Accessibility: Yes   Home Equipment: Shower seat          Prior Functioning/Environment Level of Independence: Independent        Comments: information gleaned from chart review    OT Diagnosis: Generalized weakness;Cognitive deficits   OT Problem List: Decreased strength;Decreased activity tolerance;Impaired balance (sitting and/or standing);Decreased  cognition;Decreased safety awareness;Decreased knowledge of use of DME or AE;Decreased knowledge of precautions;Cardiopulmonary status limiting activity;Pain   OT Treatment/Interventions: Self-care/ADL training;Therapeutic exercise;Energy conservation;DME and/or AE instruction;Therapeutic activities;Cognitive remediation/compensation;Patient/family education;Balance training    OT Goals(Current goals can be found in the care plan section) Acute Rehab OT Goals OT Goal Formulation: Patient unable to participate in goal setting Time For Goal Achievement: 07/21/15 Potential to Achieve Goals: Fair ADL Goals Pt Will Perform Grooming: with mod assist;sitting Additional ADL Goal #1: Pt will follow one step commands 75% of time Additional ADL Goal #2: Pt will maintain EOB sitting x 15 mins with mod A Additional ADL Goal #3: Pt will sustain attention to simple ADL taks x 5 mins with min cues   OT Frequency: Min 2X/week   Barriers to D/C:    unsure        Co-evaluation              End of Session Nurse Communication: Mobility status  Activity Tolerance: Patient limited by lethargy;Other (comment) (cognitive impairment ) Patient left: in bed;with call bell/phone within reach;with bed alarm set   Time:  1507- 1515 8 minutes   Charges:   1 procedure 1 moderately complex OT evaluation G-Codes:    Marquette Blodgett M 07/07/2015, 9:06 PM

## 2015-07-07 NOTE — Progress Notes (Signed)
Patient pulling at NGT and other tubes, place mittens on hand for safety of patient and lines

## 2015-07-08 DIAGNOSIS — E876 Hypokalemia: Secondary | ICD-10-CM

## 2015-07-08 DIAGNOSIS — R410 Disorientation, unspecified: Secondary | ICD-10-CM

## 2015-07-08 DIAGNOSIS — K913 Postprocedural intestinal obstruction: Secondary | ICD-10-CM

## 2015-07-08 LAB — CBC
HCT: 40.7 % (ref 39.0–52.0)
HEMOGLOBIN: 12.7 g/dL — AB (ref 13.0–17.0)
MCH: 29.4 pg (ref 26.0–34.0)
MCHC: 31.2 g/dL (ref 30.0–36.0)
MCV: 94.2 fL (ref 78.0–100.0)
PLATELETS: 240 10*3/uL (ref 150–400)
RBC: 4.32 MIL/uL (ref 4.22–5.81)
RDW: 16.5 % — ABNORMAL HIGH (ref 11.5–15.5)
WBC: 10.5 10*3/uL (ref 4.0–10.5)

## 2015-07-08 LAB — RENAL FUNCTION PANEL
ALBUMIN: 2.3 g/dL — AB (ref 3.5–5.0)
ANION GAP: 9 (ref 5–15)
BUN: 72 mg/dL — ABNORMAL HIGH (ref 6–20)
CHLORIDE: 118 mmol/L — AB (ref 101–111)
CO2: 29 mmol/L (ref 22–32)
Calcium: 8.4 mg/dL — ABNORMAL LOW (ref 8.9–10.3)
Creatinine, Ser: 2.69 mg/dL — ABNORMAL HIGH (ref 0.61–1.24)
GFR calc Af Amer: 26 mL/min — ABNORMAL LOW (ref >60)
GFR, EST NON AFRICAN AMERICAN: 22 mL/min — AB (ref >60)
GLUCOSE: 146 mg/dL — AB (ref 65–99)
PHOSPHORUS: 3.1 mg/dL (ref 2.5–4.6)
POTASSIUM: 3 mmol/L — AB (ref 3.5–5.1)
Sodium: 156 mmol/L — ABNORMAL HIGH (ref 135–145)

## 2015-07-08 LAB — GLUCOSE, CAPILLARY
GLUCOSE-CAPILLARY: 142 mg/dL — AB (ref 65–99)
GLUCOSE-CAPILLARY: 128 mg/dL — AB (ref 65–99)
GLUCOSE-CAPILLARY: 114 mg/dL — AB (ref 65–99)
Glucose-Capillary: 139 mg/dL — ABNORMAL HIGH (ref 65–99)

## 2015-07-08 LAB — MAGNESIUM: MAGNESIUM: 2.7 mg/dL — AB (ref 1.7–2.4)

## 2015-07-08 LAB — HEPARIN LEVEL (UNFRACTIONATED): HEPARIN UNFRACTIONATED: 0.47 [IU]/mL (ref 0.30–0.70)

## 2015-07-08 LAB — PROCALCITONIN: Procalcitonin: 0.39 ng/mL

## 2015-07-08 MED ORDER — POTASSIUM CHLORIDE 10 MEQ/50ML IV SOLN
10.0000 meq | INTRAVENOUS | Status: AC
  Administered 2015-07-08 (×4): 10 meq via INTRAVENOUS
  Filled 2015-07-08 (×4): qty 50

## 2015-07-08 MED ORDER — DOCUSATE SODIUM 50 MG/5ML PO LIQD
100.0000 mg | Freq: Every day | ORAL | Status: DC
  Administered 2015-07-08 – 2015-07-14 (×7): 100 mg
  Filled 2015-07-08 (×7): qty 10

## 2015-07-08 MED ORDER — M.V.I. ADULT IV INJ
INJECTION | INTRAVENOUS | Status: AC
  Administered 2015-07-08: 18:00:00 via INTRAVENOUS
  Filled 2015-07-08: qty 960

## 2015-07-08 MED ORDER — OLANZAPINE 5 MG PO TBDP
5.0000 mg | ORAL_TABLET | Freq: Every day | ORAL | Status: DC
  Administered 2015-07-08 – 2015-07-15 (×8): 5 mg via ORAL
  Filled 2015-07-08 (×8): qty 1

## 2015-07-08 MED ORDER — DEXTROSE 5 % IV SOLN
INTRAVENOUS | Status: DC
  Administered 2015-07-08 – 2015-07-11 (×5): via INTRAVENOUS

## 2015-07-08 MED ORDER — HEPARIN (PORCINE) IN NACL 100-0.45 UNIT/ML-% IJ SOLN
2000.0000 [IU]/h | INTRAMUSCULAR | Status: DC
  Administered 2015-07-08 – 2015-07-13 (×5): 1300 [IU]/h via INTRAVENOUS
  Administered 2015-07-13: 1700 [IU]/h via INTRAVENOUS
  Administered 2015-07-14 (×2): 2000 [IU]/h via INTRAVENOUS
  Filled 2015-07-08 (×9): qty 250

## 2015-07-08 MED ORDER — INSULIN ASPART 100 UNIT/ML ~~LOC~~ SOLN
0.0000 [IU] | SUBCUTANEOUS | Status: DC
  Administered 2015-07-08 – 2015-07-09 (×2): 2 [IU] via SUBCUTANEOUS
  Administered 2015-07-09 (×2): 3 [IU] via SUBCUTANEOUS
  Administered 2015-07-09 – 2015-07-10 (×5): 2 [IU] via SUBCUTANEOUS
  Administered 2015-07-10: 5 [IU] via SUBCUTANEOUS
  Administered 2015-07-10: 2 [IU] via SUBCUTANEOUS
  Administered 2015-07-10 – 2015-07-11 (×3): 3 [IU] via SUBCUTANEOUS
  Administered 2015-07-11 (×2): 5 [IU] via SUBCUTANEOUS
  Administered 2015-07-11 (×3): 3 [IU] via SUBCUTANEOUS
  Administered 2015-07-12 (×2): 5 [IU] via SUBCUTANEOUS
  Administered 2015-07-12: 8 [IU] via SUBCUTANEOUS
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
## 2015-07-08 NOTE — Progress Notes (Signed)
eLink Physician-Brief Progress Note Patient Name: Wyatt CarrelRalph C Miller DOB: 04/01/1945 MRN: [id number]   Date of Service  07/08/2015  HPI/Events of Note  K+ = 3.0 and Creatinine = 2.69 (improved from 3.21).  eICU Interventions  Will replete K+ and recheck BMP at 12 noon.     Intervention Category Intermediate Interventions: Electrolyte abnormality - evaluation and management  Sommer,Steven Eugene 07/08/2015, 5:52 AM

## 2015-07-08 NOTE — Progress Notes (Signed)
ANTICOAGULATION CONSULT NOTE - Initial Consult  Pharmacy Consult for Heparin Indication: hx CAD w/ recent stents  Allergies  Allergen Reactions  . Amoxicillin Swelling    Only happened with 500 mg dose, can handle 250 mg  . Penicillins Swelling    Face swelling overnight within the last 5 years Has patient had a PCN reaction causing immediate rash, facial/tongue/throat swelling, SOB or lightheadedness with hypotension: No Has patient had a PCN reaction causing severe rash involving mucus membranes or skin necrosis: No Has patient had a PCN reaction that required hospitalization No Has patient had a PCN reaction occurring within the last 10 years: Yes If all of the above answers are "NO", then may proceed with Cephalosporin use.   . Exelon [Rivastigmine Tartrate] Rash    Patch    Patient Measurements: Height: 5\' 11"  (180.3 cm) Weight: 203 lb 0.7 oz (92.1 kg) IBW/kg (Calculated) : 75.3  Vital Signs: Temp: 98.6 F (37 C) (06/13 1158) Temp Source: Oral (06/13 1158) BP: 133/81 mmHg (06/13 1200) Pulse Rate: 69 (06/13 1200)  Labs:  Recent Labs  07/06/15 0405 07/07/15 0417 07/08/15 0400 07/08/15 0657  HGB 9.8* 10.3*  --  12.7*  HCT 31.2* 32.8*  --  40.7  PLT 160 227  --  240  CREATININE 4.00* 3.21* 2.69*  --     Estimated Creatinine Clearance: 29.6 mL/min (by C-G formula based on Cr of 2.69).   Medical History: Past Medical History  Diagnosis Date  . Hypertension   . Coronary artery disease   . AAA (abdominal aortic aneurysm) (HCC)   . High cholesterol   . Depression   . Anxiety   . Essential tremor 08/18/2012  . Mild cognitive impairment with memory loss 08/18/2012  . CAD (coronary artery disease)   . Dementia   . Asthma   . Vitamin D deficiency   . H. pylori infection   . COPD (chronic obstructive pulmonary disease) (HCC)   . Anemia 2016  . Heart murmur     slight  . Myocardial infarction (HCC)     "I've had 3-4; last one was maybe 2006" (02/11/2015)  .  Anginal pain (HCC)   . Pneumonia and influenza   . Pneumonia     "2-3 times" (02/11/2015)  . Type II diabetes mellitus (HCC)     Metformin  . Pyloric spasm 1970s-1980s  . Arthritis     "knees" (02/11/2015)  . Stroke [facility](HCC)     "Dr. Jacinto HalimGanji suspects I've had a mild stroke or 2" (02/11/2015)  . Migraine     hx none recent   Assessment: 69yom s/p 2 overlapping stents to his circumflex (02/11/15) underwent AAA repair on 07/19/2015. Dual anti-platelet therapy with aspirin and plavix was interrupted for the procedure and patient now with significant ileus post-op and remains NPO. He will begin heparin to avoid the risk of stent thrombosis while aspirin and plavix remain on hold.   He was on sq heparin for prophylaxis with last dose given at 0508 this morning.   Goal of Therapy:  Heparin level 0.3-0.7 units/ml Monitor platelets by anticoagulation protocol: Yes   Plan: 1) D/C sq heparin  2) Begin IV heparin at 1300 units/hr with no bolus 3) 8 hour heparin level 4) Daily heparin level and CBC  Fredrik RiggerMarkle, Kharlie Bring Sue 07/08/2015,1:31 PM

## 2015-07-08 NOTE — Progress Notes (Addendum)
AAA Progress Note    07/08/2015 7:26 AM 7 Days Post-Op  Subjective:  Agitated, doesn't really answer questions; RN states he was very restless last night trying to get out of bed and pulling at NGT.  Ativan x 2 doses given last night.  Tm 99.3 HR 60's-70's NSR 140's-180's systolic 98% 3LO2NC  Gtts:  None  Abx:  none  Filed Vitals:   07/08/15 0500 07/08/15 0600  BP: 173/94 159/85  Pulse: 74 69  Temp:    Resp: 20 32    Physical Exam: Cardiac:  regular Lungs:  Diminished at bases Abdomen:  Soft; less full today; occasional BS; last BM 07/05/15 Incisions:  Midline incision is clean and dry with staples in tact; left BK incision is clean and dry  Extremities:  Easily palpable DP pulses bilaterally; bilateral feet are warm; right great toe dusky  CBC    Component Value Date/Time   WBC 10.5 07/08/2015 0657   WBC 6.9 03/18/2014 1605   RBC 4.32 07/08/2015 0657   RBC 3.94* 03/18/2014 1605   HGB 12.7* 07/08/2015 0657   HCT 40.7 07/08/2015 0657   PLT 240 07/08/2015 0657   MCV 94.2 07/08/2015 0657   MCH 29.4 07/08/2015 0657   MCH 33.0 03/18/2014 1605   MCHC 31.2 07/08/2015 0657   MCHC 34.7 03/18/2014 1605   RDW 16.5* 07/08/2015 0657   RDW 14.3 03/18/2014 1605   LYMPHSABS 1.9 12/13/2014 1435   LYMPHSABS 1.9 03/18/2014 1605   MONOABS 0.5 12/13/2014 1435   EOSABS 0.2 12/13/2014 1435   EOSABS 0.3 03/18/2014 1605   BASOSABS 0.1 12/13/2014 1435   BASOSABS 0.0 03/18/2014 1605    BMET    Component Value Date/Time   NA 156* 07/08/2015 0400   NA 143 03/18/2014 1605   K 3.0* 07/08/2015 0400   CL 118* 07/08/2015 0400   CO2 29 07/08/2015 0400   GLUCOSE 146* 07/08/2015 0400   GLUCOSE 93 03/18/2014 1605   BUN 72* 07/08/2015 0400   BUN 16 03/18/2014 1605   CREATININE 2.69* 07/08/2015 0400   CALCIUM 8.4* 07/08/2015 0400   GFRNONAA 22* 07/08/2015 0400   GFRAA 26* 07/08/2015 0400    INR    Component Value Date/Time   INR 1.24 07/03/2015 0305     Intake/Output  Summary (Last 24 hours) at 07/08/15 0726 Last data filed at 07/08/15 0631  Gross per 24 hour  Intake   1575 ml  Output   3110 ml  Net  -1535 ml    NGT output:  290cc/24hr   Assessment/Plan:  70 y.o. male is s/p  Repair of juxtarenal abdominal aortic aneurysm (16 mm Dacron graft), repair of inferior vena cava, left popliteal and tibial embolectomy 7 Days Post-Op  -ARF improving--creatinine is down to 2.69 with good UOP. -VDRF-pt extubated Saturday-diminished BS at bases-needs mobilization and IS -pt is very agitated--needed ativan last pm and mittens placed.  May have some ICU delirium-open blinds and turn on lights and try not to give ativan if possible -right great toe with duskiness this morning-easily palpable DP pulse-continue to monitor -hypokalemia/hypernatremia per renal -acute surgical blood loss anemia-hgb continues to improve -DVT prophylaxis:  SQ heparin -abdomen softer today but no BM since 6/10--will give another dulcolax today; discuss with Dr. Darrick PennaFields about nutrition -leukocytosis improved today   Doreatha MassedSamantha Rhyne, PA-C [facility] [phone] 07/08/2015 7:26 AM  Agree with above.  Persistant ileus may be secondary to hypokalemia Agree with TPN and some trickle feeds if NG output is  low. Renal function continues to improved Confused more today. Try to minimize sedation Ok to start heparin until taking Po to protect coronary stent per Dr Nadara Eaton   Fabienne Bruns, MD [facility] [place] Office: [phone] Pager: [phone]

## 2015-07-08 NOTE — Progress Notes (Signed)
Nutrition Follow-up  DOCUMENTATION CODES:   Not applicable  INTERVENTION:  TPN per pharmacy   NUTRITION DIAGNOSIS:   Inadequate oral intake related to inability to eat as evidenced by NPO status.  Ongoing   GOAL:   Patient will meet greater than or equal to 90% of their needs  Unmet   MONITOR:   Diet advancement, Labs, Weight trends, Skin, I & O's  REASON FOR ASSESSMENT:   Consult New TPN/TNA  ASSESSMENT:   70 yo Male admitted 06/30/15 for elective repair of AAA. The aorta was clamped above the renal arteries and post op he has had issues with hypotension with SBP in the 70's. Pre-OP Scr 06/25/15 was 1.32, 6/617 Scr was 1.75 and today 2.46. UO only 560cc yest and only 35cc so far today. Hx of HTN for >5669yrs and DM x 7-7466yrs per wife. CVP 20. Bp has improved now and is in low 100's on levophed gtt.  Pt asleep and would not wake to voice. Visitor at bedside. NGT still in place. Low intermittent suction. Spoke to [employer]. Plan for NGT to remain.   Pt has receiving D5 @ 125 ml/hr and 60 ml Prostat, about 710 kcals (6/13).  Per notes,  Minimal bowel sounds.  Hx pyloric spasms, did not tolerate TF. Will need swallow eval once ok with vascular for PO intake but mental status and ileus precludes.  Plan per pharm 6/13:  -Clinimix NO LYTES 5/15 @ 3740mL/hr. Will hold IV lipids at this time as patient is critically ill in the ICU. D#1/7 of holding lipids.  -This formula will provide 48g protein and 921kcal in a 24h period.  This regimen will be meeting <51% of minimum kcal needs and 50% of minimum protein needs.  Labs reviewed; CBGs 105-146, Na 156, K 3.0, Cl 118, BUN 72, creat 2.69, Ca 8.4, GFR 22, Mag 2.7.  Meds reviewed; D5 @ 125 ml/hr (510 kcals) -->decreasing to 85 ml/hr @ 1800 (346 kcals)  Diet Order:  Diet NPO time specified TPN (CLINIMIX) Adult without lytes  Skin:  Wound (see comment) (Stg I pu Nare)  Last BM:  6/10  Height:   Ht Readings from Last 1  Encounters:   5\' 11"  (1.803 m)    Weight:   Wt Readings from Last 1 Encounters:  07/08/15 203 lb 0.7 oz (92.1 kg)    Ideal Body Weight:  78 kg  BMI:  Body mass index is 28.33 kg/(m^2).  Estimated Nutritional Needs:   Kcal:  1800-2000  Protein:  95-105 g  Fluid:  2 L  EDUCATION NEEDS:   No education needs identified at this time  Beryle QuantMeredith Clement Deneault, MS [facility] Dietetic Intern Pager [phone]

## 2015-07-08 NOTE — Progress Notes (Signed)
[facility] ROUNDING NOTE   Subjective:   Interval History: confused but making more urine  Objective:  Vital signs in last 24 hours:  Temp:  [97.5 F (36.4 C)-99.3 F (37.4 C)] 97.6 F (36.4 C) (06/13 0752) Pulse Rate:  [65-84] 79 (06/13 1044) Resp:  [16-32] 25 (06/13 1044) BP: (143-186)/(73-134) 146/98 mmHg (06/13 1044) SpO2:  [94 %-100 %] 98 % (06/13 1044) Weight:  [92.1 kg (203 lb 0.7 oz)] 92.1 kg (203 lb 0.7 oz) (06/13 0500)  Weight change: -0.9 kg (-1 lb 15.7 oz) Filed Weights   07/06/15 0300 07/07/15 0400 07/08/15 0500  Weight: 99 kg (218 lb 4.1 oz) 93 kg (205 lb 0.4 oz) 92.1 kg (203 lb 0.7 oz)    Intake/Output: I/O last 3 completed shifts: In: 2665 [I.V.:2425; NG/GT:90; IV Piggyback:150] Out: 4725 [Urine:3985; Emesis/NG output:740]   Intake/Output this shift:  Total I/O In: 600 [I.V.:300; NG/GT:150; IV Piggyback:150] Out: 380 [Urine:330; Emesis/NG output:50]  EAV:WUJWJGen:awake and alert  CVS:RRR Resp: Decreased BS bases Abd: No BS, NTND Ext: no edema.  NEURO: follows commands Ox2 Rt triple lumen   Basic Metabolic Panel:  Recent Labs Lab 07/05/2015 1530 07/02/15 0400  07/04/15 0355 07/05/15 0315 07/06/15 0405 07/07/15 0417 07/08/15 0400  NA 137 141  < > 139 141 147* 154* 156*  K 5.8* 5.5*  < > 4.2 3.5 3.2* 3.1* 3.0*  CL 113* 112*  < > 109 108 109 115* 118*  CO2 18* 22  < > 21* 21* 26 28 29   GLUCOSE 214* 131*  < > 115* 133* 141* 133* 146*  BUN 22* 33*  < > 65* 81* 90* 87* 72*  CREATININE 1.75* 2.46*  < > 4.24* 4.17* 4.00* 3.21* 2.69*  CALCIUM 8.1* 8.6*  < > 7.0* 7.2* 7.8* 8.2* 8.4*  MG 1.7 1.7  --  1.7 1.9  --   --  2.7*  PHOS  --   --   < > 5.9* 4.3 5.2* 4.7* 3.1  < > = values in this interval not displayed.  Liver Function Tests:  Recent Labs Lab 07/02/15 0400  07/04/15 0355 07/05/15 0315 07/06/15 0405 07/07/15 0417 07/08/15 0400  AST 190*  --   --  155*  --   --   --   ALT 145*  --   --  148*  --   --   --   ALKPHOS 35*   --   --  54  --   --   --   BILITOT 0.6  --   --  1.7*  --   --   --   PROT 4.8*  --   --  5.3*  --   --   --   ALBUMIN 2.7*  < > 2.1* 2.2* 2.3* 2.3* 2.3*  < > = values in this interval not displayed.  Recent Labs Lab 07/02/15 0400  AMYLASE 50   No results for input(s): AMMONIA in the last 168 hours.  CBC:  Recent Labs Lab 07/04/15 0355 07/04/15 1954 07/05/15 0315 07/06/15 0405 07/07/15 0417 07/08/15 0657  WBC 8.8  --  7.8 8.7 11.4* 10.5  HGB 7.3* 9.4* 9.2* 9.8* 10.3* 12.7*  HCT 23.0* 29.2* 28.4* 31.2* 32.8* 40.7  MCV 98.7  --  90.7 92.6 93.7 94.2  PLT 106*  --  120* 160 227 240    Cardiac Enzymes: No results for input(s): CKTOTAL, CKMB, CKMBINDEX, TROPONINI in the last 168 hours.  BNP: Invalid input(s): POCBNP  CBG:  Recent Labs Lab 07/07/15 0745 07/07/15 1214 07/07/15 1650 07/07/15 2147 07/08/15 0747  GLUCAP 105* 140* 131* 146* 142*    Microbiology: Results for orders placed or performed during the hospital encounter of 06/25/15  Surgical pcr screen     Status: None   Collection Time: 06/25/15  1:38 PM  Result Value Ref Range Status   MRSA, PCR NEGATIVE NEGATIVE Final   Staphylococcus aureus NEGATIVE NEGATIVE Final    Comment:        The Xpert SA Assay (FDA approved for NASAL specimens in patients over 38 years of age), is one component of a comprehensive surveillance program.  Test performance has been validated by [facility] for patients greater than or equal to 19 year old. It is not intended to diagnose infection nor to guide or monitor treatment.     Coagulation Studies: No results for input(s): LABPROT, INR in the last 72 hours.  Urinalysis: No results for input(s): COLORURINE, LABSPEC, PHURINE, GLUCOSEU, HGBUR, BILIRUBINUR, KETONESUR, PROTEINUR, UROBILINOGEN, NITRITE, LEUKOCYTESUR in the last 72 hours.  Invalid input(s): APPERANCEUR    Imaging: No results found.   Medications:   . dextrose 100 mL/hr at 07/08/15 1000   .  antiseptic oral rinse  7 mL Mouth Rinse q12n4p  . chlorhexidine  15 mL Mouth Rinse BID  . dextrose  1 ampule Intravenous Once  . docusate sodium  100 mg Oral Daily  . famotidine (PEPCID) IV  20 mg Intravenous Q24H  . feeding supplement (PRO-STAT SUGAR FREE 64)  60 mL Per Tube BID  . heparin subcutaneous  5,000 Units Subcutaneous Q8H  . insulin aspart  0-15 Units Subcutaneous TID WC & HS  . metoprolol tartrate  25 mg Per Tube BID  . mometasone-formoterol  2 puff Inhalation BID  . pantoprazole sodium  40 mg Per Tube Daily  . potassium chloride  10 mEq Intravenous Q1 Hr x 4   acetaminophen **OR** acetaminophen, albuterol, bisacodyl, fentaNYL (SUBLIMAZE) injection, guaiFENesin-dextromethorphan, hydrALAZINE, LORazepam, magnesium sulfate 1 - 4 g bolus IVPB, ondansetron, phenol  Assessment/ Plan:  1. 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 UO excellent. Scr slowly decreasing.  2. SP AAA repair 3. Hx HTN 4. DM 5. Hypokalemia Replete IV KCL  6. Hyponatremia will give IV free water -- large deficit  Increase free water     LOS: 7 Christalyn Goertz W  :59 AM

## 2015-07-08 NOTE — Progress Notes (Signed)
PULMONARY / CRITICAL CARE MEDICINE   Name: [name] MRN: [id number] DOB: [birth year]    ADMISSION DATE:  07/18/2015 CONSULTATION DATE:  07/19/2015  REFERRING MD:  Dr. Darrick Penna  CHIEF COMPLAINT:  Vent Management   HISTORY OF PRESENT ILLNESS:  70 y.o. Male with a past medical history of Hypertension, CAD, AAA, High Cholesterol, Depression, Anxiety, Essential Tumor, Dementia, Asthma, COPD, former smoker, Anemia, MI, Pneumonia, and Stroke.  He was seen in February with CTA after his duplex revealed an increase in size from 4.8 cm to 5.2 cm.  The CTA revealed a 5.7 AAA.  On 5/11 he was told he was not a candidate for stent graft repair however he was a candidate for open repair.  He was taken to the OR on 6/6 for open AAA repair.  PCCM consulted on 6/6 for vent management  SUBJECTIVE:  Extubated yesterday.  Resp status stable.   Hypertensive SBP 116-187. Intermittent confusion  VITAL SIGNS: BP 146/98 mmHg  Pulse 79  Temp(Src) 97.6 F (36.4 C) (Axillary)  Resp 25  Ht  (1.803 m)  Wt 92.1 kg (203 lb 0.7 oz)  BMI 28.33 kg/m2  SpO2 98%  HEMODYNAMICS: CVP:  [2 mmHg-13 mmHg] 6 mmHg  VENTILATOR SETTINGS:    INTAKE / OUTPUT:  Intake/Output Summary (Last 24 hours) at 07/08/15 1113 Last data filed at 07/08/15 1046  Gross per 24 hour  Intake   1950 ml  Output   3015 ml  Net  -1065 ml     PHYSICAL EXAMINATION: General:  Ill appearing male, NAD, sleeping in recliner  Neuro: awake, alert, intermittently confused, MAE, follows commands   HEENT:  Mm dry, no JVD, NG tube  Cardiovascular:  S1s2, NSR 80's, 2+pulses in left foot Lungs:  resps even non labored on , lungs bilaterally clear  Abdomen:  Minimal bowel sounds, midline abdominal incision dressing dry and intact. Abd distended,mildly tender, multiple bruises  Musculoskeletal:  Normal bulk, no sig edema    LABS:  BMET  Recent Labs Lab 07/06/15 0405 07/07/15 0417 07/08/15 0400  NA 147* 154* 156*  K 3.2* 3.1* 3.0*   CL 109 115* 118*  CO2 BUN 90* 87* 72*  CREATININE 4.00* 3.21* 2.69*  GLUCOSE 141* 133* 146*    Electrolytes  Recent Labs Lab 07/04/15 0355 07/05/15 0315 07/06/15 0405 07/07/15 0417 07/08/15 0400  CALCIUM 7.0* 7.2* 7.8* 8.2* 8.4*  MG 1.7 1.9  --   --  2.7*  PHOS 5.9* 4.3 5.2* 4.7* 3.1    CBC  Recent Labs Lab 07/06/15 0405 07/07/15 0417 07/08/15 0657  WBC 8.7 11.4* 10.5  HGB 9.8* 10.3* 12.7*  HCT 31.2* 32.8* 40.7  PLT 160 227 240    Coag's  Recent Labs Lab 07/09/2015 1830 06/27/2015 2110 07/02/15 0930 07/03/15 0305  APTT 86* 49* 26  --   INR 1.36 1.32 1.17 1.24    Sepsis Markers  Recent Labs Lab 07/03/15 0305 07/07/15 1500 07/08/15 0400  PROCALCITON 1.08 0.40 0.39    ABG  Recent Labs Lab 07/03/15 1150 07/04/15 0314 07/05/15 0355  PHART 7.407 7.293* 7.395  PCO2ART 37.0 44.5 35.5  PO2ART 74.0* 72.1* 76.4*    Liver Enzymes  Recent Labs Lab 07/02/15 0400  07/05/15 0315 07/06/15 0405 07/07/15 0417 07/08/15 0400  AST 190*  --  155*  --   --   --   ALT 145*  --  148*  --   --   --   Jolly Mango  35*  --  54  --   --   --   BILITOT 0.6  --  1.7*  --   --   --   ALBUMIN 2.7*  < > 2.2* 2.3* 2.3* 2.3*  < > = values in this interval not displayed.  Cardiac Enzymes No results for input(s): TROPONINI, PROBNP in the last 168 hours.  Glucose  Recent Labs Lab 07/06/15 2136 07/07/15 0745 07/07/15 1214 07/07/15 1650 07/07/15 2147 07/08/15 0747  GLUCAP 119* 105* 140* 131* 146* 142*    Imaging No results found.   STUDIES:  Renal u/s 6/7>>> normal   CULTURES: None  ANTIBIOTICS: Vancomycin 6/6>>  SIGNIFICANT EVENTS: 6/6-Pt hd open AAA repair remains intubated post surgery. Course c/b coagulopathy (likely dilutional), acute renal injury, shock.   LINES/TUBES: RIJ dual lumen CVC 6/6>> L radial aline 6/6>> PIV x1>> ETT 6/6>>6/10  DISCUSSION: On 5/11 he was told he was not a candidate for stent graft repair however he  was a candidate for open repair.  He was taken to the OR on 6/6 for open AAA repair.  PCCM consulted on 6/6 for vent management, shock, AKI.  Renal function improving slowly, uop improving.  Continue lasix per renal.  Continue PS wean - may be able to extubate soon.       ASSESSMENT / PLAN:  PULMONARY A: Mechanically intubated post surgery -- extubated 6/10 Hx: COPD, former smoker  P:   Pulmonary hygiene - IS. Mobilize. F/u CXR. Dulera BID, Albuterol PRN. Wean O2 for sats. Monitor for airway protection.  CARDIOVASCULAR A:  Hypotension, AAA open repair, Sinus Bradycardia Hx: MI, Stroke, Heart Murmur, Anginal Pain, CAD, HTN AFib with RVR -- 6/9 - quickly converted back to NSR.  P:  Vascular surgery following. Metoprolol 25 mg per tube BID. Telemetry monitoring. PRN hydralazine for SBP >170.  RENAL A:   Acute Renal failure, ATN - Scr seems to have peaked, now trending back down.  UOP picking up.   Hyperkalemia - resolved  Hypernatremia  Hypokalemia  P:   Renal following. Monitor UOP. Lasix d/c'd. DW5 @ 75 ml/hr given hypernatremia, KVO once TPN is started. Replace electrolytes as indicated. Continue to trend BMP.  GASTROINTESTINAL A:   At Risk Protein Calorie Malnutrition - in setting of critical illness Hx: Pyloric Spasms Nausea/vomiting  P:   NPO for now per VVS. NG to suction. Will need swallow eval once ok with vascular for PO intake but mental status and ileus precludes. PPI for PUP. Dulcolax PRN, colace. Start TPN.  HEMATOLOGIC A:   Anemia - s/p PRBC x2 units 6/9 Hematuria Epistaxis - profuse nosebleed (nose packed per Vascular Surgery)-->packing now out  P:  Monitor s/sx for bleeding F/u CBC  Heparin for DVT prophylaxis   INFECTIOUS A:   Mild Leukocytosis  P:   Trend WBC's, fever curve  VAP prevention  Consider d/c central line  Assess PCT   ENDOCRINE Type II DM P:   CBG monitoring q4hrs SSI Hyper/Hypoglycemic protocol as indicated    NEUROLOGIC A:   Pain Hx: Depression, Anxiety, Essential tremor, Dementia P:   RASS goal: 0  PRN fent, minimize. Minimize sedating meds as able SL zyprexa for mood stabilization.  FAMILY  - Updates: no family available 6/13  - Inter-disciplinary family meet or Palliative Care meeting due by: July 08, 2015  Discussed with pharmacy and bedside RN.  Alyson ReedyWesam G. Alexiz Sustaita, M.D. [facility] Medicine. Pager: [phone]. After hours pager: [phone].

## 2015-07-08 NOTE — Progress Notes (Signed)
PARENTERAL NUTRITION CONSULT NOTE - INITIAL  Pharmacy Consult for TPN Indication: prolonged ileus  Allergies  Allergen Reactions  . Amoxicillin Swelling    Only happened with 500 mg dose, can handle 250 mg  . Penicillins Swelling    Face swelling overnight within the last 5 years Has patient had a PCN reaction causing immediate rash, facial/tongue/throat swelling, SOB or lightheadedness with hypotension: No Has patient had a PCN reaction causing severe rash involving mucus membranes or skin necrosis: No Has patient had a PCN reaction that required hospitalization No Has patient had a PCN reaction occurring within the last 10 years: Yes If all of the above answers are "NO", then may proceed with Cephalosporin use.   . Exelon [Rivastigmine Tartrate] Rash    Patch    Patient Measurements: Height:  (180.3 cm) Weight: 203 lb 0.7 oz (92.1 kg) IBW/kg (Calculated) : 75.3  Vital Signs: Temp: 98.6 F (37 C) (06/13 1158) Temp Source: Oral (06/13 1158) BP: 131/89 mmHg (06/13 1100) Pulse Rate: 70 (06/13 1100) Intake/Output from previous day: 06/12 0701 - 06/13 0700 In: 1675 [I.V.:1525; IV Piggyback:150] Out: 3110 [Urine:2820; Emesis/NG output:290] Intake/Output from this shift: Total I/O In: 700 [I.V.:400; NG/GT:150; IV Piggyback:150] Out: 380 [Urine:330; Emesis/NG output:50]  Labs:  Recent Labs  07/06/15 0405 07/07/15 0417 07/08/15 0657  WBC 8.7 11.4* 10.5  HGB 9.8* 10.3* 12.7*  HCT 31.2* 32.8* 40.7  PLT 160 227 240     Recent Labs  07/06/15 0405 07/07/15 0417 07/08/15 0400  NA 147* 154* 156*  K 3.2* 3.1* 3.0*  CL 109 115* 118*  CO2 GLUCOSE 141* 133* 146*  BUN 90* 87* 72*  CREATININE 4.00* 3.21* 2.69*  CALCIUM 7.8* 8.2* 8.4*  MG  --   --  2.7*  PHOS 5.2* 4.7* 3.1  ALBUMIN 2.3* 2.3* 2.3*   Estimated Creatinine Clearance: 29.6 mL/min (by C-G formula based on Cr of 2.69).    Recent Labs  07/07/15 1650 07/07/15 2147 07/08/15 0747  GLUCAP  131* 146* 142*   Insulin Requirements in the past 24 hours:  8 units SSI  Admit: admitted after CT showed increased in AAA size- not a candidate for stent graft repair, but could have open repair.  Remained intubated post-surgery until 6/10 with AKI. Now with post-op ileus to start TPN.  Surgeries/Procedures: 6/6 open AAA repair  GI: unable to tolerate TF- pulled NGT out yesterday d/t agitation. output in last 24h. Last BM 6/10. Can't get swallow eval d/t mental status as well as ileus. PPI PT, docusate  Endo:  A1C 6% on 6/6. CBGs 105-146 with SSI  Lytes: has been have hypernatremia and hypokalemia- Na 156, K 3- has gotten 4 runs so far today, Mag high at 2.7, Phos nml at 3.1, CorCa ~9.6  Renal: AKI- SCr trending down, currently 2.69   Pulm: extubated 6/10  Cards: BP nml-elevated, HR ok- Lopressor suspension  BID  Hepatobil: 6/10 LFTs: AST 155, ALT 148, TBili 1.4. Albumin low at 2.3. No trigs this admission  Neuro: very restless and agitated. Required Ativan overnight. Started on Zyprexa  ID:  WBC 10.5, afebrile. No abx.  Best Practices: subq hep, PPI PT  TPN Access: RIJ CVC double lumen placed 6/6>> TPN start date: 6/13>>  Current Nutrition:  NPO D5W @ 113mL/hr- provides ~600kcal in a 24h period Prostat PT BID  Nutritional Goals: (per RD recommendation on 6/12) 1800-2000 kCal, 95-105 grams of protein per day  Plan:  -Clinimix NO  LYTES 5/15 @ 9040mL/hr. Will hold IV lipids at this time as patient is critically ill in the ICU. D#1/7 of holding lipids. This formula will provide 48g protein and 921kcal in a 24h period. -MVI and trace elements in TPN -continue moderate SSI, but change frequency of CBGs checks/SSI administration to q4h. -reduce D5W to 7785mL/hr when TPN is started tonight at 1800 -full TPN labs in the morning -follow need for free water -follow up any changes to RD recommendations  [name], PharmD, BCPS Clinical Pharmacist Pager:  [phone] 07/08/2015 12:06 PM

## 2015-07-09 ENCOUNTER — Inpatient Hospital Stay (HOSPITAL_COMMUNITY): Payer: Medicare Other

## 2015-07-09 DIAGNOSIS — J9601 Acute respiratory failure with hypoxia: Secondary | ICD-10-CM | POA: Insufficient documentation

## 2015-07-09 DIAGNOSIS — E870 Hyperosmolality and hypernatremia: Secondary | ICD-10-CM

## 2015-07-09 DIAGNOSIS — D62 Acute posthemorrhagic anemia: Secondary | ICD-10-CM

## 2015-07-09 LAB — CBC
HEMATOCRIT: 34.1 % — AB (ref 39.0–52.0)
HEMOGLOBIN: 10.5 g/dL — AB (ref 13.0–17.0)
MCH: 29.6 pg (ref 26.0–34.0)
MCHC: 30.8 g/dL (ref 30.0–36.0)
MCV: 96.1 fL (ref 78.0–100.0)
Platelets: 305 10*3/uL (ref 150–400)
RBC: 3.55 MIL/uL — ABNORMAL LOW (ref 4.22–5.81)
RDW: 16.5 % — ABNORMAL HIGH (ref 11.5–15.5)
WBC: 13 10*3/uL — ABNORMAL HIGH (ref 4.0–10.5)

## 2015-07-09 LAB — POCT I-STAT 3, ART BLOOD GAS (G3+)
ACID-BASE EXCESS: 2 mmol/L (ref 0.0–2.0)
BICARBONATE: 23.9 meq/L (ref 20.0–24.0)
O2 Saturation: 95 %
PH ART: 7.511 — AB (ref 7.350–7.450)
TCO2: 25 mmol/L (ref 0–100)
pCO2 arterial: 30.2 mmHg — ABNORMAL LOW (ref 35.0–45.0)
pO2, Arterial: 75 mmHg — ABNORMAL LOW (ref 80.0–100.0)

## 2015-07-09 LAB — OCCULT BLOOD X 1 CARD TO LAB, STOOL: FECAL OCCULT BLD: NEGATIVE

## 2015-07-09 LAB — HEMOGLOBIN AND HEMATOCRIT, BLOOD
HEMATOCRIT: 34.5 % — AB (ref 39.0–52.0)
Hemoglobin: 10.9 g/dL — ABNORMAL LOW (ref 13.0–17.0)

## 2015-07-09 LAB — GLUCOSE, CAPILLARY
GLUCOSE-CAPILLARY: 147 mg/dL — AB (ref 65–99)
GLUCOSE-CAPILLARY: 148 mg/dL — AB (ref 65–99)
GLUCOSE-CAPILLARY: 164 mg/dL — AB (ref 65–99)
Glucose-Capillary: 135 mg/dL — ABNORMAL HIGH (ref 65–99)
Glucose-Capillary: 144 mg/dL — ABNORMAL HIGH (ref 65–99)
Glucose-Capillary: 145 mg/dL — ABNORMAL HIGH (ref 65–99)
Glucose-Capillary: 154 mg/dL — ABNORMAL HIGH (ref 65–99)

## 2015-07-09 LAB — COMPREHENSIVE METABOLIC PANEL
ALBUMIN: 2.2 g/dL — AB (ref 3.5–5.0)
ALK PHOS: 60 U/L (ref 38–126)
ALT: 135 U/L — AB (ref 17–63)
AST: 109 U/L — AB (ref 15–41)
Anion gap: 8 (ref 5–15)
BILIRUBIN TOTAL: 2.1 mg/dL — AB (ref 0.3–1.2)
BUN: 70 mg/dL — AB (ref 6–20)
CO2: 27 mmol/L (ref 22–32)
Calcium: 8 mg/dL — ABNORMAL LOW (ref 8.9–10.3)
Chloride: 118 mmol/L — ABNORMAL HIGH (ref 101–111)
Creatinine, Ser: 2.5 mg/dL — ABNORMAL HIGH (ref 0.61–1.24)
GFR calc Af Amer: 28 mL/min — ABNORMAL LOW (ref >60)
GFR calc non Af Amer: 25 mL/min — ABNORMAL LOW (ref >60)
GLUCOSE: 146 mg/dL — AB (ref 65–99)
POTASSIUM: 2.8 mmol/L — AB (ref 3.5–5.1)
Sodium: 153 mmol/L — ABNORMAL HIGH (ref 135–145)
TOTAL PROTEIN: 5.5 g/dL — AB (ref 6.5–8.1)

## 2015-07-09 LAB — DIFFERENTIAL
BASOS ABS: 0 10*3/uL (ref 0.0–0.1)
Basophils Relative: 0 %
EOS PCT: 0 %
Eosinophils Absolute: 0 10*3/uL (ref 0.0–0.7)
LYMPHS ABS: 1.2 10*3/uL (ref 0.7–4.0)
Lymphocytes Relative: 9 %
MONOS PCT: 10 %
Monocytes Absolute: 1.3 10*3/uL — ABNORMAL HIGH (ref 0.1–1.0)
Neutro Abs: 10.5 10*3/uL — ABNORMAL HIGH (ref 1.7–7.7)
Neutrophils Relative %: 81 %

## 2015-07-09 LAB — PHOSPHORUS: Phosphorus: 2.8 mg/dL (ref 2.5–4.6)

## 2015-07-09 LAB — PREALBUMIN: Prealbumin: 13.1 mg/dL — ABNORMAL LOW (ref 18–38)

## 2015-07-09 LAB — HEPARIN LEVEL (UNFRACTIONATED): Heparin Unfractionated: 0.49 IU/mL (ref 0.30–0.70)

## 2015-07-09 LAB — TRIGLYCERIDES: TRIGLYCERIDES: 164 mg/dL — AB (ref <150)

## 2015-07-09 LAB — MAGNESIUM: Magnesium: 2.8 mg/dL — ABNORMAL HIGH (ref 1.7–2.4)

## 2015-07-09 LAB — PROCALCITONIN: Procalcitonin: 0.42 ng/mL

## 2015-07-09 MED ORDER — FENTANYL CITRATE (PF) 100 MCG/2ML IJ SOLN
INTRAMUSCULAR | Status: AC
  Administered 2015-07-09: 100 ug
  Filled 2015-07-09: qty 2

## 2015-07-09 MED ORDER — FENTANYL CITRATE (PF) 100 MCG/2ML IJ SOLN
25.0000 ug | INTRAMUSCULAR | Status: DC | PRN
  Administered 2015-07-09 – 2015-07-13 (×4): 50 ug via INTRAVENOUS
  Administered 2015-07-14: 25 ug via INTRAVENOUS
  Filled 2015-07-09 (×4): qty 2

## 2015-07-09 MED ORDER — ANTISEPTIC ORAL RINSE SOLUTION (CORINZ): 7.0000 mL | Freq: Four times a day (QID) | OROMUCOSAL | Status: DC

## 2015-07-09 MED ORDER — MIDAZOLAM HCL 2 MG/2ML IJ SOLN
1.0000 mg | INTRAMUSCULAR | Status: DC | PRN
  Administered 2015-07-09 – 2015-07-10 (×3): 4 mg via INTRAVENOUS
  Filled 2015-07-09 (×3): qty 4

## 2015-07-09 MED ORDER — PHENYLEPHRINE HCL 10 MG/ML IJ SOLN
0.0000 ug/min | INTRAVENOUS | Status: DC
  Administered 2015-07-10 (×2): 30 ug/min via INTRAVENOUS
  Administered 2015-07-10: 20 ug/min via INTRAVENOUS
  Filled 2015-07-09 (×3): qty 1

## 2015-07-09 MED ORDER — ANTISEPTIC ORAL RINSE SOLUTION (CORINZ)
7.0000 mL | OROMUCOSAL | Status: DC
  Administered 2015-07-09 – 2015-07-15 (×60): 7 mL via OROMUCOSAL

## 2015-07-09 MED ORDER — MIDAZOLAM HCL 2 MG/2ML IJ SOLN
INTRAMUSCULAR | Status: AC
  Administered 2015-07-09: 2 mg
  Filled 2015-07-09: qty 4

## 2015-07-09 MED ORDER — TRACE MINERALS CR-CU-MN-SE-ZN 10-1000-500-60 MCG/ML IV SOLN
INTRAVENOUS | Status: AC
  Administered 2015-07-09: 18:00:00 via INTRAVENOUS
  Filled 2015-07-09: qty 1440

## 2015-07-09 MED ORDER — ETOMIDATE 2 MG/ML IV SOLN
25.0000 mg | Freq: Once | INTRAVENOUS | Status: AC
  Administered 2015-07-09: 25 mg via INTRAVENOUS

## 2015-07-09 MED ORDER — IPRATROPIUM-ALBUTEROL 0.5-2.5 (3) MG/3ML IN SOLN
3.0000 mL | RESPIRATORY_TRACT | Status: DC
  Administered 2015-07-09 – 2015-07-15 (×37): 3 mL via RESPIRATORY_TRACT
  Filled 2015-07-09 (×36): qty 3

## 2015-07-09 MED ORDER — CHLORHEXIDINE GLUCONATE 0.12% ORAL RINSE (MEDLINE KIT): 15.0000 mL | Freq: Two times a day (BID) | OROMUCOSAL | Status: DC

## 2015-07-09 MED ORDER — ROCURONIUM BROMIDE 50 MG/5ML IV SOLN
50.0000 mg | Freq: Once | INTRAVENOUS | Status: AC
  Administered 2015-07-09: 50 mg via INTRAVENOUS
  Filled 2015-07-09: qty 5

## 2015-07-09 MED ORDER — POTASSIUM CHLORIDE 10 MEQ/100ML IV SOLN
10.0000 meq | INTRAVENOUS | Status: AC
Start: 2015-07-09 — End: 2015-07-09
  Administered 2015-07-09 (×6): 10 meq via INTRAVENOUS
  Filled 2015-07-09: qty 100

## 2015-07-09 MED ORDER — CHLORHEXIDINE GLUCONATE 0.12% ORAL RINSE (MEDLINE KIT)
15.0000 mL | Freq: Two times a day (BID) | OROMUCOSAL | Status: DC
  Administered 2015-07-09 – 2015-07-15 (×13): 15 mL via OROMUCOSAL

## 2015-07-09 NOTE — Progress Notes (Signed)
PARENTERAL NUTRITION CONSULT NOTE  Pharmacy Consult for TPN Indication: prolonged ileus  Allergies  Allergen Reactions  . Amoxicillin Swelling    Only happened with 500 mg dose, can handle 250 mg  . Penicillins Swelling    Face swelling overnight within the last 5 years Has patient had a PCN reaction causing immediate rash, facial/tongue/throat swelling, SOB or lightheadedness with hypotension: No Has patient had a PCN reaction causing severe rash involving mucus membranes or skin necrosis: No Has patient had a PCN reaction that required hospitalization No Has patient had a PCN reaction occurring within the last 10 years: Yes If all of the above answers are "NO", then may proceed with Cephalosporin use.   . Exelon [Rivastigmine Tartrate] Rash    Patch    Patient Measurements: Height: 5\' 11"  (180.3 cm) Weight: 190 lb 11.2 oz (86.5 kg) IBW/kg (Calculated) : 75.3  Vital Signs: Temp: 98.3 F (36.8 C) (06/14 0400) Temp Source: Oral (06/14 0400) BP: 146/89 mmHg (06/14 0700) Pulse Rate: 84 (06/14 0700) Intake/Output from previous day: 06/13 0701 - 06/14 0700 In: 3346.8 [I.V.:2457.5; NG/GT:210; IV Piggyback:150; TPN:529.3] Out: 3020 [Urine:2370; Emesis/NG output:650] Intake/Output from this shift:    Labs:  Recent Labs  07/07/15 0417 07/08/15 0657 07/09/15 0411  WBC 11.4* 10.5 13.0*  HGB 10.3* 12.7* 10.5*  HCT 32.8* 40.7 34.1*  PLT 227 240 305     Recent Labs  07/07/15 0417 07/08/15 0400 07/09/15 0410 07/09/15 0411  NA 154* 156*  --  153*  K 3.1* 3.0*  --  2.8*  CL 115* 118*  --  118*  CO2 28 29  --  27  GLUCOSE 133* 146*  --  146*  BUN 87* 72*  --  70*  CREATININE 3.21* 2.69*  --  2.50*  CALCIUM 8.2* 8.4*  --  8.0*  MG  --  2.7*  --  2.8*  PHOS 4.7* 3.1  --  2.8  PROT  --   --   --  5.5*  ALBUMIN 2.3* 2.3*  --  2.2*  AST  --   --   --  109*  ALT  --   --   --  135*  ALKPHOS  --   --   --  60  BILITOT  --   --   --  2.1*  PREALBUMIN  --   --   --   13.1*  TRIG  --   --  164*  --    Estimated Creatinine Clearance: 29.3 mL/min (by C-G formula based on Cr of 2.5).    Recent Labs  07/08/15 1916 07/09/15 0003 07/09/15 0415  GLUCAP 139* 148* 135*   Insulin Requirements in the past 24 hours:  10 units SSI  Admit: admitted after CT showed increased in AAA size- not a candidate for stent graft repair, but could have open repair.  Remained intubated post-surgery until 6/10 with AKI. Now with post-op ileus to start TPN.  Surgeries/Procedures: 6/6 open AAA repair  GI: unable to tolerate TF. With NGT- 650mL output in last 24h.  Can't get swallow eval d/t mental status as well as ileus. Had large tarry stool this morning and Hgb dropped - on IV heparin. Baseline prealbumin 13.1 PPI PT, docusate  Endo:  A1C 6% on 6/6. CBGs 128-146 with SSI  Lytes: has been have hypernatremia and hypokalemia- Na 153, K 2.8-6 runs ordered, Mag high at 2.8, Phos nml at 2.8, CorCa ~9.3  Renal: AKI- SCr trending down, currently 2.5.  UOP remains good at 1.69mL/kg/hr  Pulm: extubated 6/10- 98/3L [place]  Cards: BP nml-elevated, HR ok- Lopressor suspension  BID  Hepatobil: LFTs checked this morning-  AST 109, ALT 135, TBili up a bit to 2.1. Albumin low at 2.2. Trigs 164  Neuro: very restless and agitated. Started on Zyprexa  ID:  WBC 13, afebrile. No abx.  Best Practices: subq hep, PPI PT  TPN Access: RIJ CVC double lumen placed 6/6>> TPN start date: 6/13>>  Current Nutrition:  -Clinimix NO LYTES 5/15 @ 62mL/hr-provides 48g protein and 921kcal -D5W @ 43mL/hr- provides 346kcal in a 24h period -Prostat PT BID  Nutritional Goals: (per RD recommendation on 6/13) 1800-2000 kCal, 95-105 grams of protein per day  Plan:  -Increase Clinimix NO LYTES 5/15 to 23mL/hr. Will hold IV lipids at this time as patient is critically ill in the ICU. D#2/7 of holding lipids. This formula will provide 72g protein and 1022kcal in a 24h period. -MVI and trace elements  in TPN -continue moderate SSI and CBGs q4h -reduce D5W to 11mL/hr with increase in TPN rate -full TPN labs in the morning as per protocol -follow need for free water  Zarriah Starkel D. Tiney Zipper, PharmD, BCPS Clinical Pharmacist Pager: [phone] 07/09/2015 8:04 AM

## 2015-07-09 NOTE — Progress Notes (Signed)
Occupational Therapy Treatment Patient Details Name: [name] MRN: [id number] DOB: Jan 02, 1946 Today's Date: 07/09/2015    History of present illness This is a 70 y.o. Male with a past medical history of Hypertension, CAD, AAA, High Cholesterol, Depression, Anxiety, Essential Tumor, Dementia, Asthma, COPD, former smoker, Anemia, MI, Pneumonia, and Stroke. He was seen in February with CTA after his duplex revealed an increase in size from 4.8 cm to 5.2 cm. The CTA revealed a 5.7 AAA. On 5/11 he was told he was not a candidate for stent graft repair however he was a candidate for open repair. He was taken to the OR on 6/6 for open AAA repair. PCCM consulted on 6/6 for vent managementExtubated 07/05/15.    OT comments  This 70 yo male admitted with above presents to acute OT with continued issues and gains below. He will continue to benefit from acute OT with follow up at SNF to work towards a higher level of independence and decrease burden of care.  Follow Up Recommendations  SNF;Supervision/Assistance - 24 hour    Equipment Recommendations   (TBD at next venue)    Recommendations for Other Services      Precautions / Restrictions Precautions Precautions: Fall Precaution Comments: Does not have sternal precautions due to incision for AAA is not sternal access Restrictions Weight Bearing Restrictions: No       Mobility Bed Mobility Overal bed mobility: Needs Assistance Bed Mobility: Supine to Sit     Supine to sit: Total assist;+2 for physical assistance        Transfers Overall transfer level: Needs assistance   Transfers: Sit to/from Stand Sit to Stand: +2 physical assistance;Mod assist;From elevated surface              Balance Overall balance assessment: Needs assistance Sitting-balance support: Bilateral upper extremity supported;Feet supported   Sitting balance - Comments: ranged from poor to zero with no regards for safety and not attempt to self  correct Postural control: Posterior lean Standing balance support: Bilateral upper extremity supported Standing balance-Leahy Scale: Poor Standing balance comment: Did achieve full upright position x2 with increased time with use of Stedy except with left knee in partial flexion                   ADL Overall ADL's : Needs assistance/impaired                           Toilet Transfer Details (indicate cue type and reason): +2 sit<>stand from raised bed to [employer]   Behavior During Therapy: Flat affect;Impulsive Overall Cognitive Status: Impaired/Different from baseline Area of Impairment: Orientation;Attention;Following commands;Safety/judgement;Problem solving Orientation Level: Disoriented to;Place;Time;Situation Current Attention Level: Sustained    Following Commands: Follows one step commands inconsistently (only followed 1 step commands 25% of time) Safety/Judgement: Decreased awareness of safety;Decreased awareness of deficits   Problem Solving: Decreased initiation;Difficulty sequencing;Requires verbal cues;Requires tactile cues                   Pertinent Vitals/ Pain       Pain Assessment: Faces Pain Score: 6  Pain Location: back Pain Descriptors / Indicators: Sore;Aching Pain Intervention(s): Monitored during session;Repositioned;Limited activity within patient's tolerance         Frequency Min 2X/week  Progress Toward Goals  OT Goals(current goals can now be found in the care plan section)  Progress towards OT goals: Progressing toward goals (following 1 step commands 25% of time, working no sitting balance)     Plan Discharge plan remains appropriate    Co-evaluation    PT/OT/SLP Co-Evaluation/Treatment: Yes Reason for Co-Treatment: For patient/therapist safety;Complexity of the patient's impairments (multi-system involvement)   OT goals addressed during session:  Strengthening/ROM      End of Session Equipment Utilized During Treatment: Oxygen   Activity Tolerance Patient limited by pain (and cognition)   Patient Left in chair;with call bell/phone within reach;with chair alarm set   Nurse Communication Mobility status;Need for lift equipment (NG tube restarted prior to finding out it needed to be clamped due to recent meds)        Time: [id number] OT Time Calculation (min): 30 min  Charges: OT General Charges $OT Visit: 1 Procedure OT Treatments $Therapeutic Activity: 8-22 mins  Evette GeorgesLeonard, Elliot Simoneaux Eva [phone] 07/09/2015, 11:24 AM

## 2015-07-09 NOTE — Progress Notes (Signed)
Increased WOB noted.  Notified Dr. Kendrick FriesMcQuaid.  ABG ordered.  Result called.  No further orders at this time.

## 2015-07-09 NOTE — Progress Notes (Addendum)
AAA Progress Note    07/09/2015 7:31 AM 8 Days Post-Op  Subjective:  Wants a tall glass of water. Complains of pain at right great toe.  RN reports tarry type stool this morning and drop in hgb.    Tm 99.4 now afebrile HR  60's-80's NSR 120's-150's systolic 98% 3LO2NC   Gtts:  Heparin (cardiac stents)  Nutrition:  TPN at 40cc/hr  Filed Vitals:   07/09/15 0600 07/09/15 0700  BP: 135/76 146/89  Pulse: 79 84  Temp:    Resp: 33 18    Physical Exam: Cardiac:  regular Lungs:  Non labored Abdomen:  Soft, NT/ND +tarry BM this am  Incisions:  Midline is clean and dry with staples in tact.  Left BK incision is clean and dry Extremities:  Bilateral feet are warm with +palpable DP/PT bilaterally; right great toe continues to be dusky; some redness on the dorsum of the right foot below the great toe that is sore to touch.  CBC    Component Value Date/Time   WBC 13.0* 07/09/2015 0411   WBC 6.9 03/18/2014 1605   RBC 3.55* 07/09/2015 0411   RBC 3.94* 03/18/2014 1605   HGB 10.5* 07/09/2015 0411   HCT 34.1* 07/09/2015 0411   PLT 305 07/09/2015 0411   MCV 96.1 07/09/2015 0411   MCH 29.6 07/09/2015 0411   MCH 33.0 03/18/2014 1605   MCHC 30.8 07/09/2015 0411   MCHC 34.7 03/18/2014 1605   RDW 16.5* 07/09/2015 0411   RDW 14.3 03/18/2014 1605   LYMPHSABS 1.2 07/09/2015 0411   LYMPHSABS 1.9 03/18/2014 1605   MONOABS 1.3* 07/09/2015 0411   EOSABS 0.0 07/09/2015 0411   EOSABS 0.3 03/18/2014 1605   BASOSABS 0.0 07/09/2015 0411   BASOSABS 0.0 03/18/2014 1605    BMET    Component Value Date/Time   NA 153* 07/09/2015 0411   NA 143 03/18/2014 1605   K 2.8* 07/09/2015 0411   CL 118* 07/09/2015 0411   CO2 27 07/09/2015 0411   GLUCOSE 146* 07/09/2015 0411   GLUCOSE 93 03/18/2014 1605   BUN 70* 07/09/2015 0411   BUN 16 03/18/2014 1605   CREATININE 2.50* 07/09/2015 0411   CALCIUM 8.0* 07/09/2015 0411   GFRNONAA 25* 07/09/2015 0411   GFRAA 28* 07/09/2015 0411    INR      Component Value Date/Time   INR 1.24 07/03/2015 0305     Intake/Output Summary (Last 24 hours) at 07/09/15 0731 Last data filed at 07/09/15 0700  Gross per 24 hour  Intake 3346.81 ml  Output   3020 ml  Net 326.81 ml     Assessment/Plan:  70 y.o. 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 -hypokalemia/hypernatremia treatment per renal given renal issues -c/o pain at right great toe-could be forming some gout -dark stool this morning and drop with hgb at 9am - value this morning more consistent with 2 days ago--will check H&H.  May need to discontinue heparin.  Will check hemoccult this am -less confused this morning and following commands -will try to get pt up to chair today and try incentive spirometry - leukocytosis trended up a little this morning--mobilize if possible -NGT output still up (350cc last shift) -TPN started last pm (40cc/hr)   Doreatha Massed, PA-C [facility] [phone]  07/09/2015 7:31 AM   Agree with above.  Still confused on my exam.  Abdomen overall soft but NG output still fairly dark.  Agree right first MTP joint may be gout.  Right first toe dusky follow for now. Still with multiple electrolyte derangements probably contributing to ileus.  Replete K Continue TPN until more gut function Trend WBC for now if continues to rise pan culture and probably needs central line changed. If evidence of GI bleed will definitely need to stop heparin  Fabienne Brunsharles Livvy Spilman, MD [facility] [place] Office: [phone] Pager: [phone]

## 2015-07-09 NOTE — Progress Notes (Signed)
ANTICOAGULATION CONSULT NOTE Pharmacy Consult for Heparin Indication: hx CAD w/ recent stents  Allergies  Allergen Reactions  . Amoxicillin Swelling    Only happened with 500 mg dose, can handle 250 mg  . Penicillins Swelling    Face swelling overnight within the last 5 years Has patient had a PCN reaction causing immediate rash, facial/tongue/throat swelling, SOB or lightheadedness with hypotension: No Has patient had a PCN reaction causing severe rash involving mucus membranes or skin necrosis: No Has patient had a PCN reaction that required hospitalization No Has patient had a PCN reaction occurring within the last 10 years: Yes If all of the above answers are "NO", then may proceed with Cephalosporin use.   . Exelon [Rivastigmine Tartrate] Rash    Patch    Patient Measurements: Height: 5\' 11"  (180.3 cm) Weight: 203 lb 0.7 oz (92.1 kg) IBW/kg (Calculated) : 75.3  Vital Signs: Temp: 99.4 F (37.4 C) (06/13 1944) Temp Source: Oral (06/13 1945) BP: 129/71 mmHg (06/13 2300) Pulse Rate: 74 (06/13 2300)  Labs:  Recent Labs  07/06/15 0405 07/07/15 0417 07/08/15 0400 07/08/15 0657 07/08/15 2300  HGB 9.8* 10.3*  --  12.7*  --   HCT 31.2* 32.8*  --  40.7  --   PLT 160 227  --  240  --   HEPARINUNFRC  --   --   --   --  0.47  CREATININE 4.00* 3.21* 2.69*  --   --     Estimated Creatinine Clearance: 29.6 mL/min (by C-G formula based on Cr of 2.69).  Assessment: 70 yo m s/p recent PCI, ASA and Plavix on hold while NPO due to ileus, for heparin  Goal of Therapy:  Heparin level 0.3-0.7 units/ml Monitor platelets by anticoagulation protocol: Yes   Plan: Continue Heparin at current rate  Follow-up am labs.   Eddie Candlebbott, Jerone Cudmore [place] 07/09/2015,12:09 AM

## 2015-07-09 NOTE — Progress Notes (Signed)
ANTICOAGULATION CONSULT NOTE - Follow up Consult  Pharmacy Consult for Heparin Indication: hx CAD w/ recent stents  Allergies  Allergen Reactions  . Amoxicillin Swelling    Only happened with 500 mg dose, can handle 250 mg  . Penicillins Swelling    Face swelling overnight within the last 5 years Has patient had a PCN reaction causing immediate rash, facial/tongue/throat swelling, SOB or lightheadedness with hypotension: No Has patient had a PCN reaction causing severe rash involving mucus membranes or skin necrosis: No Has patient had a PCN reaction that required hospitalization No Has patient had a PCN reaction occurring within the last 10 years: Yes If all of the above answers are "NO", then may proceed with Cephalosporin use.   . Exelon [Rivastigmine Tartrate] Rash    Patch    Patient Measurements: Height: 5\' 11"  (180.3 cm) Weight: 190 lb 11.2 oz (86.5 kg) IBW/kg (Calculated) : 75.3  Vital Signs: Temp: 98.5 F (36.9 C) (06/14 0700) Temp Source: Oral (06/14 0700) BP: 146/89 mmHg (06/14 0700) Pulse Rate: 84 (06/14 0700)  Labs:  Recent Labs  07/07/15 0417 07/08/15 0400 07/08/15 0657 07/08/15 2300 07/09/15 0411 07/09/15 0414  HGB 10.3*  --  12.7*  --  10.5*  --   HCT 32.8*  --  40.7  --  34.1*  --   PLT 227  --  240  --  305  --   HEPARINUNFRC  --   --   --  0.47  --  0.49  CREATININE 3.21* 2.69*  --   --  2.50*  --     Estimated Creatinine Clearance: 29.3 mL/min (by C-G formula based on Cr of 2.5).  Medications: Heparin @ 1300 units/hr  Assessment: 69yom s/p 2 overlapping stents to his circumflex (02/11/15) underwent AAA repair on 07/06/2015. Dual anti-platelet therapy with aspirin and plavix was interrupted for the procedure and patient now with significant ileus post-op and remains NPO. He was started on heparin yesterday to avoid the risk of stent thrombosis while aspirin and plavix on hold. Heparin level is therapeutic at 0.49. Noted drop in Hgb 12.7 > 10.5 and  dark tarry stool.  Goal of Therapy:  Heparin level 0.3-0.7 units/ml Monitor platelets by anticoagulation protocol: Yes   Plan: 1) Continue heparin at 1300 units/hr for now 2) Daily heparin level and CBC 3) Follow up FOBT  Fredrik RiggerMarkle, Daneya Hartgrove Sue 07/09/2015,8:37 AM

## 2015-07-09 NOTE — Progress Notes (Signed)
[facility] ROUNDING NOTE   Subjective:   Interval History: unchanged today  Objective:  Vital signs in last 24 hours:  Temp:  [98.3 F (36.8 C)-99.4 F (37.4 C)] 98.5 F (36.9 C) (06/14 0700) Pulse Rate:  [63-89] 63 (06/14 1100) Resp:  [13-35] 13 (06/14 1100) BP: (111-155)/(50-99) 111/90 mmHg (06/14 1100) SpO2:  [94 %-100 %] 100 % (06/14 1100) Weight:  [86.5 kg (190 lb 11.2 oz)] 86.5 kg (190 lb 11.2 oz) (06/14 0400)  Weight change: -5.6 kg (-12 lb 5.5 oz) Filed Weights   07/07/15 0400 07/08/15 0500 07/09/15 0400  Weight: 93 kg (205 lb 0.4 oz) 92.1 kg (203 lb 0.7 oz) 86.5 kg (190 lb 11.2 oz)    Intake/Output: I/O last 3 completed shifts: In: 4596.8 [I.V.:3657.5; NG/GT:210; IV Piggyback:200] Out: 4455 [Urine:3615; Emesis/NG output:840]   Intake/Output this shift:  Total I/O In: 1013.3 [I.V.:323.3; NG/GT:230; IV Piggyback:300; TPN:160] Out: 435 [Urine:435]  NWG:NFAOZ and alert  CVS:RRR Resp: Decreased BS bases Abd: No BS, NTND Ext: no edema.  NEURO: follows commands Ox2 Rt triple lumen   Basic Metabolic Panel:  Recent Labs Lab 07/04/15 0355 07/05/15 0315 07/06/15 0405 07/07/15 0417 07/08/15 0400 07/09/15 0411  NA 139 141 147* 154* 156* 153*  K 4.2 3.5 3.2* 3.1* 3.0* 2.8*  CL 109 108 109 115* 118* 118*  CO2 21* 21* GLUCOSE 115* 133* 141* 133* 146* 146*  BUN 65* 81* 90* 87* 72* 70*  CREATININE 4.24* 4.17* 4.00* 3.21* 2.69* 2.50*  CALCIUM 7.0* 7.2* 7.8* 8.2* 8.4* 8.0*  MG 1.7 1.9  --   --  2.7* 2.8*  PHOS 5.9* 4.3 5.2* 4.7* 3.1 2.8    Liver Function Tests:  Recent Labs Lab 07/05/15 0315 07/06/15 0405 07/07/15 0417 07/08/15 0400 07/09/15 0411  AST 155*  --   --   --  109*  ALT 148*  --   --   --  135*  ALKPHOS 54  --   --   --  60  BILITOT 1.7*  --   --   --  2.1*  PROT 5.3*  --   --   --  5.5*  ALBUMIN 2.2* 2.3* 2.3* 2.3* 2.2*   No results for input(s): LIPASE, AMYLASE in the last 168 hours. No results for  input(s): AMMONIA in the last 168 hours.  CBC:  Recent Labs Lab 07/05/15 0315 07/06/15 0405 07/07/15 0417 07/08/15 0657 07/09/15 0411 07/09/15 0938  WBC 7.8 8.7 11.4* 10.5 13.0*  --   NEUTROABS  --   --   --   --  10.5*  --   HGB 9.2* 9.8* 10.3* 12.7* 10.5* 10.9*  HCT 28.4* 31.2* 32.8* 40.7 34.1* 34.5*  MCV 90.7 92.6 93.7 94.2 96.1  --   PLT 120* 160 227 240 305  --     Cardiac Enzymes: No results for input(s): CKTOTAL, CKMB, CKMBINDEX, TROPONINI in the last 168 hours.  BNP: Invalid input(s): POCBNP  CBG:  Recent Labs Lab 07/08/15 1654 07/08/15 1916 07/09/15 0003 07/09/15 0415 07/09/15 0815  GLUCAP 114* 139* 148* 135* 154*    Microbiology: Results for orders placed or performed during the hospital encounter of 06/25/15  Surgical pcr screen     Status: None   Collection Time: 06/25/15  1:38 PM  Result Value Ref Range Status   MRSA, PCR NEGATIVE NEGATIVE Final   Staphylococcus aureus NEGATIVE NEGATIVE Final    Comment:        The  Xpert SA Assay (FDA approved for NASAL specimens in patients over 71 years of age), is one component of a comprehensive surveillance program.  Test performance has been validated by [facility] for patients greater than or equal to 71 year old. It is not intended to diagnose infection nor to guide or monitor treatment.     Coagulation Studies: No results for input(s): LABPROT, INR in the last 72 hours.  Urinalysis: No results for input(s): COLORURINE, LABSPEC, PHURINE, GLUCOSEU, HGBUR, BILIRUBINUR, KETONESUR, PROTEINUR, UROBILINOGEN, NITRITE, LEUKOCYTESUR in the last 72 hours.  Invalid input(s): APPERANCEUR    Imaging: No results found.   Medications:   . dextrose 60 mL/hr at 07/09/15 1100  . heparin 1,300 Units/hr (07/09/15 1100)  . TPN (CLINIMIX) Adult without lytes 40 mL/hr at 07/09/15 1100  . TPN (CLINIMIX) Adult without lytes     . antiseptic oral rinse  7 mL Mouth Rinse q12n4p  . chlorhexidine  15 mL Mouth  Rinse BID  . dextrose  1 ampule Intravenous Once  . docusate  100 mg Per Tube Daily  . feeding supplement (PRO-STAT SUGAR FREE 64)  60 mL Per Tube BID  . insulin aspart  0-15 Units Subcutaneous Q4H  . metoprolol tartrate  25 mg Per Tube BID  . mometasone-formoterol  2 puff Inhalation BID  . OLANZapine zydis  5 mg Oral Daily  . pantoprazole sodium  40 mg Per Tube Daily  . potassium chloride  10 mEq Intravenous Q1 Hr x 6   acetaminophen **OR** acetaminophen, albuterol, bisacodyl, fentaNYL (SUBLIMAZE) injection, guaiFENesin-dextromethorphan, hydrALAZINE, LORazepam, ondansetron, phenol  Assessment/ Plan:  1. 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 UO excellent. Scr slowly decreasing.  2. SP AAA repair 3. Hx HTN 4. DM 5. Hypokalemia Replete IV KCL  6. Hyponatremia will give IV free water -- large deficit continue  free water     LOS: 8 Kennadi [place] W @TODAY @12 :00 PM

## 2015-07-09 NOTE — Progress Notes (Signed)
PCCM Attending Note:  Called the patient's bedside for persistent hypoxic respiratory failure and tachypnea with respiratory rate in the 40s producing a respiratory alkalosis. Patient able to tell me his name and signify that he was having difficulty breathing. Stat portable chest x-ray shows a questionable right lower lobe opacity. Patient's significant other updated via phone regarding tenuous respiratory status. Patient did not respond to nebulizer treatment with prolonged exhalation phase. I can find no wheezing on physical exam. Given potential of worsening respiratory status further overnight I believe proceeding with an elective endotracheal intubation is the safest for the patient at this time. Patient's family agrees.  Donna ChristenJennings E. Jamison NeighborNestor, M.D. [facility] Pulmonary [facility] Critical Care Pager:  [phone] After 3pm or if no response, call [phone] 10:17 PM 07/09/2015

## 2015-07-09 NOTE — Progress Notes (Signed)
PULMONARY / CRITICAL CARE MEDICINE   Name: [name] MRN: [id number] DOB: 12/11/1945    ADMISSION DATE:  07/22/2015 CONSULTATION DATE:  07/03/2015  REFERRING MD:  Dr. Darrick PennaFields  CHIEF COMPLAINT:  Vent Management   HISTORY OF PRESENT ILLNESS:  70 y.o. Male with a past medical history of Hypertension, CAD, AAA, High Cholesterol, Depression, Anxiety, Essential Tumor, Dementia, Asthma, COPD, former smoker, Anemia, MI, Pneumonia, and Stroke.  He was seen in February with CTA after his duplex revealed an increase in size from 4.8 cm to 5.2 cm.  The CTA revealed a 5.7 AAA.  On 5/11 he was told he was not a candidate for stent graft repair however he was a candidate for open repair.  He was taken to the OR on 6/6 for open AAA repair.  PCCM consulted on 6/6 for vent management  SUBJECTIVE:  No events overnight, has not required benzos or haldol overnight.    VITAL SIGNS: BP 134/50 mmHg  Pulse 80  Temp(Src) 98.5 F (36.9 C) (Oral)  Resp 22  Ht 5\' 11"  (1.803 m)  Wt 86.5 kg (190 lb 11.2 oz)  BMI 26.61 kg/m2  SpO2 98%  HEMODYNAMICS:    VENTILATOR SETTINGS:    INTAKE / OUTPUT:  Intake/Output Summary (Last 24 hours) at 07/09/15 1113 Last data filed at 07/09/15 1000  Gross per 24 hour  Intake 3447.06 ml  Output   3075 ml  Net 372.06 ml   PHYSICAL EXAMINATION: General:  Sitting up in chair, confused but talking and protecting airway. Neuro: awake, alert, intermittently confused, MAE, follows commands   HEENT:  Mm dry, no JVD, NG tube  Cardiovascular:  S1s2, NSR 80's, 2+pulses in left foot Lungs:  resps even non labored on [place], lungs bilaterally clear  Abdomen:  Minimal bowel sounds, midline abdominal incision dressing dry and intact. Abd distended,mildly tender, multiple bruises  Musculoskeletal:  Normal bulk, no sig edema   LABS:  BMET  Recent Labs Lab 07/07/15 0417 07/08/15 0400 07/09/15 0411  NA 154* 156* 153*  K 3.1* 3.0* 2.8*  CL 115* 118* 118*  CO2 28 29 27   BUN 87*  72* 70*  CREATININE 3.21* 2.69* 2.50*  GLUCOSE 133* 146* 146*   Electrolytes  Recent Labs Lab 07/05/15 0315  07/07/15 0417 07/08/15 0400 07/09/15 0411  CALCIUM 7.2*  < > 8.2* 8.4* 8.0*  MG 1.9  --   --  2.7* 2.8*  PHOS 4.3  < > 4.7* 3.1 2.8  < > = values in this interval not displayed.  CBC  Recent Labs Lab 07/07/15 0417 07/08/15 0657 07/09/15 0411 07/09/15 0938  WBC 11.4* 10.5 13.0*  --   HGB 10.3* 12.7* 10.5* 10.9*  HCT 32.8* 40.7 34.1* 34.5*  PLT 227 240 305  --    Coag's  Recent Labs Lab 07/03/15 0305  INR 1.24   Sepsis Markers  Recent Labs Lab 07/07/15 1500 07/08/15 0400 07/09/15 0411  PROCALCITON 0.40 0.39 0.42   ABG  Recent Labs Lab 07/03/15 1150 07/04/15 0314 07/05/15 0355  PHART 7.407 7.293* 7.395  PCO2ART 37.0 44.5 35.5  PO2ART 74.0* 72.1* 76.4*   Liver Enzymes  Recent Labs Lab 07/05/15 0315  07/07/15 0417 07/08/15 0400 07/09/15 0411  AST 155*  --   --   --  109*  ALT 148*  --   --   --  135*  ALKPHOS 54  --   --   --  60  BILITOT 1.7*  --   --   --  2.1*  ALBUMIN 2.2*  < > 2.3* 2.3* 2.2*  < > = values in this interval not displayed.  Cardiac Enzymes No results for input(s): TROPONINI, PROBNP in the last 168 hours.  Glucose  Recent Labs Lab 07/08/15 1156 07/08/15 1654 07/08/15 1916 07/09/15 0003 07/09/15 0415 07/09/15 0815  GLUCAP 128* 114* 139* 148* 135* 154*    Imaging No results found.   STUDIES:  Renal u/s 6/7>>> normal   CULTURES: None  ANTIBIOTICS: Vancomycin 6/6>>  SIGNIFICANT EVENTS: 6/6-Pt hd open AAA repair remains intubated post surgery. Course c/b coagulopathy (likely dilutional), acute renal injury, shock.   LINES/TUBES: RIJ dual lumen CVC 6/6>> L radial aline 6/6>> PIV x1>> ETT 6/6>>6/10  DISCUSSION: On 5/11 he was told he was not a candidate for stent graft repair however he was a candidate for open repair.  He was taken to the OR on 6/6 for open AAA repair.  PCCM consulted on 6/6  for vent management, shock, AKI.  Renal function improving slowly, uop improving.  Continue lasix per renal.  Continue PS wean - may be able to extubate soon.       ASSESSMENT / PLAN:  PULMONARY A: Mechanically intubated post surgery -- extubated 6/10 Hx: COPD, former smoker  P:   Pulmonary hygiene - IS. Mobilize. F/u CXR. Dulera BID, Albuterol PRN. Wean O2 for sats.  CARDIOVASCULAR A:  Hypotension, AAA open repair, Sinus Bradycardia Hx: MI, Stroke, Heart Murmur, Anginal Pain, CAD, HTN AFib with RVR -- 6/9 - quickly converted back to NSR.  P:  Vascular surgery following. Metoprolol 25 mg per tube BID. Telemetry monitoring. PRN hydralazine for SBP >170.  RENAL A:   Acute Renal failure, ATN - Scr seems to have peaked, now trending back down.  UOP picking up.   Hyperkalemia - resolved  Hypernatremia  Hypokalemia  P:   Monitor UOP. Lasix d/c'd. DW5 @ 60 ml/hr given hypernatremia Replace electrolytes as indicated. Continue to trend BMP.  GASTROINTESTINAL A:   At Risk Protein Calorie Malnutrition - in setting of critical illness Hx: Pyloric Spasms Nausea/vomiting  P:   NPO for now per VVS. TPN. NG to suction. Will need swallow eval once ok with vascular for PO intake but mental status and ileus precludes. PPI for PUP. Dulcolax PRN, colace. Stool occult blood negative.  HEMATOLOGIC A:   Anemia - s/p PRBC x2 units 6/9 Hematuria Epistaxis - profuse nosebleed (nose packed per Vascular Surgery)-->packing now out  Hg drop, likely dilutional. P:  Monitor s/sx for bleeding F/u CBC  Heparin per pharmacy  INFECTIOUS A:   Mild Leukocytosis  P:   Trend WBC's, fever curve  VAP prevention  Monitor off abx.  ENDOCRINE Type II DM P:   CBG monitoring q4hrs SSI Hyper/Hypoglycemic protocol as indicated   NEUROLOGIC A:   Pain Hx: Depression, Anxiety, Essential tremor, Dementia P:   RASS goal: 0  PRN fent, minimize. Minimize sedating meds as able SL zyprexa  for mood stabilization.  FAMILY  - Updates: no family available 6/14  Discussed with PCCM-NP.  Alyson Reedy, M.D. [facility] Pulmonary/Critical Care Medicine. Pager: [phone]. After hours pager: [phone].

## 2015-07-09 NOTE — Progress Notes (Signed)
eLink Physician-Brief Progress Note Patient Name: Wyatt CarrelRalph C Santoni DOB: 04/03/1945 MRN: [id number]   Date of Service  07/09/2015  HPI/Events of Note  RN concerned about work of breathing On camera check, asleep, mildly tachypneic, no accessory muscle use Vitals stable  eICU Interventions  ABG     Intervention Category Intermediate Interventions: Respiratory distress - evaluation and management  Max FickleDouglas McQuaid 07/09/2015, 3:53 PM

## 2015-07-09 NOTE — Procedures (Signed)
Medications Administered: 1. Etomidate 25 mg IV push 2. Rocuronium 50 mg IV push  Description of Procedure: Patient was laying recumbent.  rapid sequence intubation was performed with etomidate IV push followed by rocuronium once patient was sedated. Patient had copious thick, bile colored secretions in retropharynx visualized with video laryngoscopy. Video laryngoscope was removed and oral airway was inserted for bag mask ventilation with worsening oxygenation.  Patient's oxygenation then rapidly recovered. Video laryngoscope was then reinserted into retropharynx and secretions were suctioned allowing for visualization of the epiglottis and vocal cords. A 7.5 endotracheal tube was inserted between the vocal cords with ease and secured at 25 cm at the lip. Repeat capnographic color change was appreciated. Bilateral breath sounds were auscultated. Patient had symmetric chest wall rise. Stat portable chest x-ray pending.   Complications: Patient had transient desaturation that responded quickly with oral airway and bag mask ventilation along with suctioning of secretions in retropharynx.

## 2015-07-09 NOTE — Progress Notes (Signed)
eLink Physician-Brief Progress Note Patient Name: Wyatt CarrelRalph C Loflin DOB: 04/19/1945 MRN: [id number]   Date of Service  07/09/2015  HPI/Events of Note  Tachypnea persists agitation  eICU Interventions  CXR now Will ask bedside MD to evaluate     Intervention Category Intermediate Interventions: Respiratory distress - evaluation and management  Max FickleDouglas Zhana Jeangilles 07/09/2015, 8:13 PM

## 2015-07-09 NOTE — Progress Notes (Signed)
Physical Therapy Treatment Patient Details Name: [name] MRN: [id number] DOB: 06/13/1945 Today's Date: 07/09/2015    History of Present Illness This is a 70 y.o. Male with a past medical history of Hypertension, CAD, AAA, High Cholesterol, Depression, Anxiety, Essential Tumor, Dementia, Asthma, COPD, former smoker, Anemia, MI, Pneumonia, and Stroke. He was seen in February with CTA after his duplex revealed an increase in size from 4.8 cm to 5.2 cm. The CTA revealed a 5.7 AAA. On 5/11 he was told he was not a candidate for stent graft repair however he was a candidate for open repair. He was taken to the OR on 6/6 for open AAA repair. PCCM consulted on 6/6 for vent managementExtubated 07/05/15.     PT Comments    Pt admitted with above diagnosis. Pt currently with functional limitations due to balance and endurance deficits as well as issues with safety. Pt was able to get to chair with use of STedy and +2 assist.  Pt progressing.  Pt will benefit from skilled PT to increase their independence and safety with mobility to allow discharge to the venue listed below.    Follow Up Recommendations  SNF     Equipment Recommendations  Other (comment) (TBA)    Recommendations for Other Services       Precautions / Restrictions Precautions Precautions: Fall Precaution Comments: Does not have sternal precautions due to incision for AAA is not sternal access Restrictions Weight Bearing Restrictions: No    Mobility  Bed Mobility Overal bed mobility: Needs Assistance Bed Mobility: Supine to Sit     Supine to sit: Total assist;+2 for physical assistance        Transfers Overall transfer level: Needs assistance   Transfers: Sit to/from Stand;Stand Pivot Transfers Sit to Stand: +2 physical assistance;Mod assist;From elevated surface         General transfer comment: Antony Salmon worked well for pt as pt was able to pull up on Stedy and stand.  USed Stedy to move pt to chair.  Pt  stood well again from [place] to sit into chair.  Total assist to position pt in chair using pad.    Ambulation/Gait                 Stairs            Wheelchair Mobility    Modified Rankin (Stroke Patients Only)       Balance Overall balance assessment: Needs assistance Sitting-balance support: Bilateral upper extremity supported;Feet supported Sitting balance-Leahy Scale: Zero Sitting balance - Comments: ranged from poor to zero with no regards for safety and not attempt to self correct Postural control: Posterior lean Standing balance support: Bilateral upper extremity supported;During functional activity Standing balance-Leahy Scale: Poor Standing balance comment: Did achieve full upright position x2 with incr time with use of Stedy except with left knee in partial flexion.                     Cognition Arousal/Alertness: Awake/alert Behavior During Therapy: Flat affect;Impulsive Overall Cognitive Status: Impaired/Different from baseline Area of Impairment: Orientation;Attention;Following commands;Safety/judgement;Problem solving Orientation Level: Disoriented to;Place;Time;Situation Current Attention Level: Sustained Memory: Decreased short-term memory;Decreased recall of precautions Following Commands: Follows one step commands inconsistently (only followed 1 step commands 25% of time) Safety/Judgement: Decreased awareness of safety;Decreased awareness of deficits   Problem Solving: Decreased initiation;Difficulty sequencing;Requires verbal cues;Requires tactile cues      Exercises      General Comments General comments (skin integrity, edema,  etc.): Pt cotinues to be restless and impulsive.       Pertinent Vitals/Pain Pain Assessment: Faces Pain Score: 6  Faces Pain Scale: Hurts little more Pain Location: back Pain Descriptors / Indicators: Aching Pain Intervention(s): Limited activity within patient's tolerance;Monitored during  session;Repositioned  VSS    Home Living                      Prior Function            PT Goals (current goals can now be found in the care plan section) Progress towards PT goals: Progressing toward goals    Frequency  Min 3X/week    PT Plan Current plan remains appropriate    Co-evaluation PT/OT/SLP Co-Evaluation/Treatment: Yes Reason for Co-Treatment: Complexity of the patient's impairments (multi-system involvement) PT goals addressed during session: Mobility/safety with mobility OT goals addressed during session: Strengthening/ROM     End of Session Equipment Utilized During Treatment: Gait belt;Oxygen Activity Tolerance: Patient limited by fatigue;Patient limited by lethargy Patient left: in chair;with call bell/phone within reach;with chair alarm set     Time: 1001-1029 PT Time Calculation (min) (ACUTE ONLY): 28 min  Charges:  $Therapeutic Activity: 8-22 mins                    G CodesTawni Miller:      [name] 07/09/2015, 11:33 AM Eber Jonesawn Merridith Dershem,PT Acute Rehabilitation [phone] (pager)

## 2015-07-09 NOTE — Progress Notes (Addendum)
Assisted MD Nestor with intubation. Oral care performed on pt prior to intubation, noted several small brown dried secretions. Gave 25 mg etomidate @ 2207 and 50 mg rocuronium @ 2208. Pt tolerated procedure well.  Will continue to monitor.

## 2015-07-10 ENCOUNTER — Inpatient Hospital Stay (HOSPITAL_COMMUNITY): Payer: Medicare Other

## 2015-07-10 LAB — CBC
HCT: 29.2 % — ABNORMAL LOW (ref 39.0–52.0)
Hemoglobin: 9.1 g/dL — ABNORMAL LOW (ref 13.0–17.0)
MCH: 29.6 pg (ref 26.0–34.0)
MCHC: 31.2 g/dL (ref 30.0–36.0)
MCV: 95.1 fL (ref 78.0–100.0)
PLATELETS: 289 10*3/uL (ref 150–400)
RBC: 3.07 MIL/uL — AB (ref 4.22–5.81)
RDW: 16.8 % — ABNORMAL HIGH (ref 11.5–15.5)
WBC: 13.8 10*3/uL — AB (ref 4.0–10.5)

## 2015-07-10 LAB — GLUCOSE, CAPILLARY
GLUCOSE-CAPILLARY: 130 mg/dL — AB (ref 65–99)
GLUCOSE-CAPILLARY: 173 mg/dL — AB (ref 65–99)
GLUCOSE-CAPILLARY: 190 mg/dL — AB (ref 65–99)
Glucose-Capillary: 206 mg/dL — ABNORMAL HIGH (ref 65–99)
Glucose-Capillary: 177 mg/dL — ABNORMAL HIGH (ref 65–99)
Glucose-Capillary: 194 mg/dL — ABNORMAL HIGH (ref 65–99)

## 2015-07-10 LAB — URINALYSIS, ROUTINE W REFLEX MICROSCOPIC
BILIRUBIN URINE: NEGATIVE
Glucose, UA: NEGATIVE mg/dL
Ketones, ur: NEGATIVE mg/dL
Leukocytes, UA: NEGATIVE
Nitrite: NEGATIVE
PH: 5.5 (ref 5.0–8.0)
Protein, ur: NEGATIVE mg/dL
SPECIFIC GRAVITY, URINE: 1.019 (ref 1.005–1.030)

## 2015-07-10 LAB — COMPREHENSIVE METABOLIC PANEL
ALT: 108 U/L — ABNORMAL HIGH (ref 17–63)
AST: 78 U/L — ABNORMAL HIGH (ref 15–41)
Albumin: 1.7 g/dL — ABNORMAL LOW (ref 3.5–5.0)
Alkaline Phosphatase: 47 U/L (ref 38–126)
Anion gap: 8 (ref 5–15)
BUN: 73 mg/dL — ABNORMAL HIGH (ref 6–20)
CHLORIDE: 117 mmol/L — AB (ref 101–111)
CO2: 25 mmol/L (ref 22–32)
Calcium: 7.3 mg/dL — ABNORMAL LOW (ref 8.9–10.3)
Creatinine, Ser: 2.58 mg/dL — ABNORMAL HIGH (ref 0.61–1.24)
GFR, EST AFRICAN AMERICAN: 27 mL/min — AB (ref >60)
GFR, EST NON AFRICAN AMERICAN: 24 mL/min — AB (ref >60)
Glucose, Bld: 181 mg/dL — ABNORMAL HIGH (ref 65–99)
POTASSIUM: 2.5 mmol/L — AB (ref 3.5–5.1)
Sodium: 150 mmol/L — ABNORMAL HIGH (ref 135–145)
Total Bilirubin: 2.2 mg/dL — ABNORMAL HIGH (ref 0.3–1.2)
Total Protein: 4.9 g/dL — ABNORMAL LOW (ref 6.5–8.1)

## 2015-07-10 LAB — POCT I-STAT 3, ART BLOOD GAS (G3+)
BICARBONATE: 24.8 meq/L — AB (ref 20.0–24.0)
O2 Saturation: 100 %
PH ART: 7.406 (ref 7.350–7.450)
Patient temperature: 99.1
TCO2: 26 mmol/L (ref 0–100)
pCO2 arterial: 39.6 mmHg (ref 35.0–45.0)
pO2, Arterial: 327 mmHg — ABNORMAL HIGH (ref 80.0–100.0)

## 2015-07-10 LAB — URINE MICROSCOPIC-ADD ON

## 2015-07-10 LAB — MAGNESIUM: MAGNESIUM: 2.6 mg/dL — AB (ref 1.7–2.4)

## 2015-07-10 LAB — PHOSPHORUS: PHOSPHORUS: 2.3 mg/dL — AB (ref 2.5–4.6)

## 2015-07-10 LAB — HEPARIN LEVEL (UNFRACTIONATED): Heparin Unfractionated: 0.5 IU/mL (ref 0.30–0.70)

## 2015-07-10 MED ORDER — POTASSIUM CHLORIDE 10 MEQ/50ML IV SOLN
10.0000 meq | INTRAVENOUS | Status: AC
  Administered 2015-07-10 (×6): 10 meq via INTRAVENOUS
  Filled 2015-07-10: qty 50

## 2015-07-10 MED ORDER — TRACE MINERALS CR-CU-MN-SE-ZN 10-1000-500-60 MCG/ML IV SOLN
INTRAVENOUS | Status: AC
  Administered 2015-07-10: 17:00:00 via INTRAVENOUS
  Filled 2015-07-10: qty 1440

## 2015-07-10 MED ORDER — METOPROLOL TARTRATE 25 MG/10 ML ORAL SUSPENSION
12.5000 mg | Freq: Two times a day (BID) | ORAL | Status: DC
  Administered 2015-07-11: 12.5 mg
  Filled 2015-07-10: qty 5

## 2015-07-10 MED ORDER — FENTANYL CITRATE (PF) 100 MCG/2ML IJ SOLN: 50.0000 ug | INTRAMUSCULAR | Status: DC | PRN

## 2015-07-10 MED ORDER — POTASSIUM CHLORIDE 10 MEQ/100ML IV SOLN
10.0000 meq | INTRAVENOUS | Status: AC
  Administered 2015-07-10 (×6): 10 meq via INTRAVENOUS
  Filled 2015-07-10 (×6): qty 100

## 2015-07-10 MED ORDER — SODIUM CHLORIDE 0.9 % IV SOLN
INTRAVENOUS | Status: DC
  Administered 2015-07-10 – 2015-07-13 (×2): via INTRAVENOUS

## 2015-07-10 MED ORDER — SODIUM PHOSPHATES 45 MMOLE/15ML IV SOLN
20.0000 mmol | Freq: Once | INTRAVENOUS | Status: AC
  Administered 2015-07-10: 20 mmol via INTRAVENOUS
  Filled 2015-07-10: qty 6.67

## 2015-07-10 MED ORDER — PROPOFOL 1000 MG/100ML IV EMUL
0.0000 ug/kg/min | INTRAVENOUS | Status: DC
  Administered 2015-07-10: 20 ug/kg/min via INTRAVENOUS
  Administered 2015-07-10: 50 ug/kg/min via INTRAVENOUS
  Administered 2015-07-10: 20 ug/kg/min via INTRAVENOUS
  Administered 2015-07-10: 5 ug/kg/min via INTRAVENOUS
  Administered 2015-07-10 – 2015-07-11 (×6): 50 ug/kg/min via INTRAVENOUS
  Administered 2015-07-12 (×2): 40 ug/kg/min via INTRAVENOUS
  Filled 2015-07-10 (×14): qty 100

## 2015-07-10 MED ORDER — MIDAZOLAM HCL 2 MG/2ML IJ SOLN
INTRAMUSCULAR | Status: AC
  Filled 2015-07-10: qty 2

## 2015-07-10 MED ORDER — MIDAZOLAM HCL 2 MG/2ML IJ SOLN
2.0000 mg | Freq: Once | INTRAMUSCULAR | Status: AC
  Administered 2015-07-10: 2 mg via INTRAVENOUS

## 2015-07-10 MED ORDER — FENTANYL CITRATE (PF) 100 MCG/2ML IJ SOLN
50.0000 ug | INTRAMUSCULAR | Status: DC | PRN
  Administered 2015-07-10 – 2015-07-11 (×5): 50 ug via INTRAVENOUS
  Filled 2015-07-10 (×4): qty 2

## 2015-07-10 MED ORDER — PHENYLEPHRINE HCL 10 MG/ML IJ SOLN
0.0000 ug/min | INTRAVENOUS | Status: DC
  Administered 2015-07-10: 60 ug/min via INTRAVENOUS
  Administered 2015-07-11: 50 ug/min via INTRAVENOUS
  Administered 2015-07-13: 290 ug/min via INTRAVENOUS
  Administered 2015-07-13 (×2): 300 ug/min via INTRAVENOUS
  Administered 2015-07-13: 100 ug/min via INTRAVENOUS
  Administered 2015-07-13: 210 ug/min via INTRAVENOUS
  Administered 2015-07-13: 150 ug/min via INTRAVENOUS
  Administered 2015-07-14: 200 ug/min via INTRAVENOUS
  Administered 2015-07-14: 160 ug/min via INTRAVENOUS
  Administered 2015-07-14: 280 ug/min via INTRAVENOUS
  Administered 2015-07-14: 170 ug/min via INTRAVENOUS
  Administered 2015-07-14: 220 ug/min via INTRAVENOUS
  Administered 2015-07-14: 150 ug/min via INTRAVENOUS
  Administered 2015-07-15 (×6): 400 ug/min via INTRAVENOUS
  Administered 2015-07-15: 200 ug/min via INTRAVENOUS
  Administered 2015-07-15: 400 ug/min via INTRAVENOUS
  Administered 2015-07-15: 250 ug/min via INTRAVENOUS
  Filled 2015-07-10 (×32): qty 4

## 2015-07-10 NOTE — Care Management Important Message (Signed)
Important Message  Patient Details  Name: Wyatt CarrelRalph C Minehart MRN: [id number] Date of Birth: 05/28/1945   Medicare Important Message Given:  Yes    Malley Hauter Abena 07/10/2015, 11:09 AM

## 2015-07-10 NOTE — Progress Notes (Addendum)
AAA Progress Note    07/10/2015 8:04 AM 9 Days Post-Op  Subjective:  Re-intubated last pm; sedated  Tm 100.9 HR  50's-80's NSR 80's-120's systolic 100% .[id number]   Gtts: Neo Heparin Propofol  TPN  Filed Vitals:   07/10/15 0730 07/10/15 0748  BP: 109/57 109/57  Pulse: 55 63  Temp:    Resp: 29 22    Physical Exam: Cardiac:  regular Lungs:  CTAB Abdomen:  Soft, NT/ND; -BS Incisions:  Midline incision with small amount of bloody drainage around the umbilicus-otherwise, clean and dry; Left below knee incision is clean and dry Extremities:  2+ DP pulses bilaterally; 1+ PT pulses bilaterally; still with duskiness of right great toe and redness on dorsum of right foot proximal to great toe   CBC    Component Value Date/Time   WBC 13.8* 07/10/2015 0525   WBC 6.9 03/18/2014 1605   RBC 3.07* 07/10/2015 0525   RBC 3.94* 03/18/2014 1605   HGB 9.1* 07/10/2015 0525   HCT 29.2* 07/10/2015 0525   PLT 289 07/10/2015 0525   MCV 95.1 07/10/2015 0525   MCH 29.6 07/10/2015 0525   MCH 33.0 03/18/2014 1605   MCHC 31.2 07/10/2015 0525   MCHC 34.7 03/18/2014 1605   RDW 16.8* 07/10/2015 0525   RDW 14.3 03/18/2014 1605   LYMPHSABS 1.2 07/09/2015 0411   LYMPHSABS 1.9 03/18/2014 1605   MONOABS 1.3* 07/09/2015 0411   EOSABS 0.0 07/09/2015 0411   EOSABS 0.3 03/18/2014 1605   BASOSABS 0.0 07/09/2015 0411   BASOSABS 0.0 03/18/2014 1605    BMET    Component Value Date/Time   NA 150* 07/10/2015 0525   NA 143 03/18/2014 1605   K 2.5* 07/10/2015 0525   CL 117* 07/10/2015 0525   CO2 25 07/10/2015 0525   GLUCOSE 181* 07/10/2015 0525   GLUCOSE 93 03/18/2014 1605   BUN 73* 07/10/2015 0525   BUN 16 03/18/2014 1605   CREATININE 2.58* 07/10/2015 0525   CALCIUM 7.3* 07/10/2015 0525   GFRNONAA 24* 07/10/2015 0525   GFRAA 27* 07/10/2015 0525    INR    Component Value Date/Time   INR 1.24 07/03/2015 0305     Intake/Output Summary (Last 24 hours) at 07/10/15 0804 Last data filed  at 07/10/15 0700  Gross per 24 hour  Intake 3918.45 ml  Output   2150 ml  Net 1768.45 ml   NGT output:  400cc last shift  CXR 07/10/15: bibasilar atelectasis   Assessment/Plan:  70 y.o. male is s/p  Repair of juxtarenal abdominal aortic aneurysm (16 mm Dacron graft), repair of inferior vena cava, left popliteal and tibial embolectomy 9 Days Post-Op  -VDRF-re-intubated last night - ABG with respiratory alkalosis.  Pt tachypnic and no response with nebulizers. -hypotension-now require neosynephrine gtt for blood pressure support -hgb continues to decrease--hemoccult obtained yesterday was negative May need packed cells to help wean off neo, but continue to monitor for now -ARF-creatinine bumped up slightly today to 2.58 from 2.5.  Unclear about UOP for the past several hours as it is not documented.  There is ~ 100cc in the foley bag.  Continue to monitor -hypokalemia - supplement per renal -hyperkalemia-improving on D5--per renal, rate increased  -nutrition:  Continue TPN -keep NGT-RN inquired about changing to OG due to a pressure sore on the nose, however, the NGT was very difficult to place and he did have profuse bleeding from this post surgery.   -leukocytosis slightly up from yesterday-continue to monitor -DM:  Glucose increasing-continue  SSI   Doreatha Massed, PA-C [facility] [phone] 07/10/2015 8:04 AM  Agree with above Reintubated for resp failure ? Confusion/aspiration/electrolyte derangements Abdomen slight distended still no BM Continue TPN Maintain fluids electrolytes to support renal recovery Leukocytosis will pan culture  Fabienne Bruns, MD [facility] [place] Office: [phone] Pager: [phone]

## 2015-07-10 NOTE — Progress Notes (Signed)
[place] NOTE  Pharmacy Consult for TPN Indication: prolonged ileus  Allergies  Allergen Reactions  . Amoxicillin Swelling    Only happened with 500 mg dose, can handle 250 mg  . Penicillins Swelling    Face swelling overnight within the last 5 years Has patient had a PCN reaction causing immediate rash, facial/tongue/throat swelling, SOB or lightheadedness with hypotension: No Has patient had a PCN reaction causing severe rash involving mucus membranes or skin necrosis: No Has patient had a PCN reaction that required hospitalization No Has patient had a PCN reaction occurring within the last 10 years: Yes If all of the above answers are "NO", then may proceed with Cephalosporin use.   . Exelon [Rivastigmine Tartrate] Rash    Patch    Patient Measurements: Height: '5\' 11"'  (180.3 cm) Weight: 195 lb 1.7 oz (88.5 kg) IBW/kg (Calculated) : 75.3  Vital Signs: Temp: 98.9 F (37.2 C) (06/15 0827) Temp Source: Oral (06/15 0827) BP: 109/57 mmHg (06/15 0748) Pulse Rate: 63 (06/15 0748) Intake/Output from previous day: 06/14 0701 - 06/15 0700 In: 4086.5 [I.V.:1973.1; NG/GT:290; IV Piggyback:600; TPN:1223.3] Out: 2335 [Urine:1395; Emesis/NG output:940] Intake/Output from this shift:    Labs:  Recent Labs  07/08/15 0657 07/09/15 0411 07/09/15 0938 07/10/15 0525  WBC 10.5 13.0*  --  13.8*  HGB 12.7* 10.5* 10.9* 9.1*  HCT 40.7 34.1* 34.5* 29.2*  PLT 240 305  --  289     Recent Labs  07/08/15 0400 07/09/15 0410 07/09/15 0411 07/10/15 0525  NA 156*  --  153* 150*  K 3.0*  --  2.8* 2.5*  CL 118*  --  118* 117*  CO2 29  --  27 25  GLUCOSE 146*  --  146* 181*  BUN 72*  --  70* 73*  CREATININE 2.69*  --  2.50* 2.58*  CALCIUM 8.4*  --  8.0* 7.3*  MG 2.7*  --  2.8* 2.6*  PHOS 3.1  --  2.8 2.3*  PROT  --   --  5.5* 4.9*  ALBUMIN 2.3*  --  2.2* 1.7*  AST  --   --  109* 78*  ALT  --   --  135* 108*  ALKPHOS  --   --  60 47  BILITOT  --   --  2.1*  2.2*  PREALBUMIN  --   --  13.1*  --   TRIG  --  164*  --   --    Estimated Creatinine Clearance: 28.4 mL/min (by C-G formula based on Cr of 2.58).    Recent Labs  07/09/15 2343 07/10/15 0345 07/10/15 0825  GLUCAP 145* 173* 130*   Insulin Requirements in the past 24 hours:  15 units SSI  Admit: admitted after CT showed increased in AAA size- not a candidate for stent graft repair, but could have open repair.  Remained intubated post-surgery until 6/10 with AKI. Now with post-op ileus to start TPN.  Surgeries/Procedures: 6/6 open AAA repair  GI: unable to tolerate TF. With NGT-91m output in last 24h.  Can't get swallow eval d/t mental status as well as ileus, now reintubated. Had large tarry stool 6/14 AM and Hgb dropped - on IV heparin with therapeutic levels. Baseline prealbumin 13.1 PPI PT, docusate  Endo:  A1C 6% on 6/6. CBGs 130-181 with SSI  Lytes: has been having hypernatremia and hypokalemia- Na improved at 150, K worse at 2.5 this morning- total of 12 KCl runs ordered by MDs, Mag high at 2.6,  Phos dropped to 2.3- 28mol sodium phos ordered by renal, CorCa ~9.1. Appears patient is still having some refeeding issues  Renal: AKI- SCr currently 2.58- stable. UOP remains good at 0.771mkg/hr- no acute needs for HD  Pulm: extubated 6/10 and was on [place], then had increased work of breathing and was reintubated late evening on 6/14  Cards: BP soft- requiring Neo, HR brady-nml- Lopressor suspension decreased to 12.60m660mID  Hepatobil: AST 78- improved, ALT 108- improved, Alk phos remains nml at 47, TBili up at 2.2. Albumin low at 1.7. Trigs 164  Neuro: very restless and agitated. Started on Zyprexa. Requiring sedation with propofol- currently running at 63m560mg/min = 10.4mL/17m ID:  WBC 13.8, afebrile. No abx.  Best Practices: subq hep, PPI PT, MC  TPN Access: RIJ CVC double lumen placed 6/6>> TPN start date: 6/13>>  Current Nutrition:  Clinimix NO LYTES 5/15 @  60mL/23mrovides 72g protein and 1022kcal   +D5W @ 60mL/h88mrovides 245kcal in a 24h period   +Prostat 60mL PT63m- each 30mL pro14ms 15g protein and            72kcal (has missed some doses)   +propofol at 10.4mL/hr- p64mofol contains 1.1kcal per mL =             ~275kcal in a 24h period at the same rate TOTAL/24h: 132g protein and 1830kcal  Nutritional Goals: (per RD recommendation on 6/15) 2380 kCal, 130-140 grams of protein per day  Plan:  -Continue Clinimix NO LYTES 5/15 at 60mL/hr as39mappears patient is refeeding. Will hold IV lipids at this time as patient is critically ill in the ICU. D#3/7 of holding lipids.This formula will provide 72g protein and 1022kcal in a 24h period. Continue other sources of nutrition as ordered currently- planned discussed with RD Heather.  -MVI and trace elements in TPN -Add regular insulin 10 units to TPN bag, also continue moderate SSI and CBGs q4h -Stop PT Protonix as concerned patient isn't absorbing medication d/t NGT to suction- add Pepcid 40mg to TPN63mll allow MD to determine MIVF- currently D5W @ 100mL/hr (inc12med by Renal today) -Renal function panel and magnesium ordered for the morning -Follow need for free water PT  Shalonda Sachse D. Yehudit Fulginiti, PharmD, BCPS Clinical Pharmacist Pager: [phone] AM

## 2015-07-10 NOTE — Progress Notes (Signed)
CRITICAL VALUE ALERT  Critical value received:  K+ 2.5  Date of notification:  07/10/15  Time of notification:  0604  Critical value read back: Yes   Nurse who received alert:  Aviva SignsJessica Sumedh Shinsato, RN  MD notified (1st page):  Dr. Nicholos Johnsamachandran  Time of first page:  0605  MD notified (2nd page):  Time of second page:  Responding MD:  Dr. Nicholos Johnsamachandran  Time MD responded:  [phone]

## 2015-07-10 NOTE — Progress Notes (Signed)
Nutrition Follow-up  DOCUMENTATION CODES:   Not applicable  INTERVENTION:  TPN per pharmacy Continue 60 ml prostat BID.  Re-estimated needs   NUTRITION DIAGNOSIS:   Inadequate oral intake related to inability to eat as evidenced by NPO status.  Ongoing   GOAL:   Patient will meet greater than or equal to 90% of their needs  Met  MONITOR:   Diet advancement, Labs, Weight trends, Skin, I & O's  ASSESSMENT:   70 yo Male admitted 06/30/15 for elective repair of AAA. The aorta was clamped above the renal arteries and post op he has had issues with hypotension with SBP in the 70's. Pre-OP Scr 06/25/15 was 1.32, 6/617 Scr was 1.75 and today 2.46. UO only 560cc yest and only 35cc so far today. Hx of HTN for >52yr and DM x 7-887yrper wife. CVP 20. Bp has improved now and is in low 100's on levophed gtt.  Spoke with RN.   Pt re-intubated, back on vent-->re-estimated needs.  Respiratory alkalosis  Temp (24hrs), Avg:99.6 F (37.6 C), Min:98.9 F (37.2 C), Max:100.9 F (38.3 C) MV: 19.5   NGT to continuous suction, clamped for 30 minutes when meds are given.  TPN currently running at: Clinimix NO LYTES 5/15 at 608mr (1022 kcals and 72 grams of protein).  Prostat 60 ml BID (60 g protein and 400 kcals) Propofol @ 10.4 ml/hr providing 274 kcals D5 @ 100 ml/hr (408 kcals)  TPN + 60 ml prostat BID + D5 @ 100 + propofol will provide 132g protein and 2104 kcal in a 24h period.  Per pharmacy: TPN to stay at current rate; concern for refeeding.  Weight @ 195 lbs, trending down from 213 lbs admission weight. Pt is -1.7 L fluids.  Updated NFPE: no muscle or fat depletion. Will continue to re-assess at follow ups.  Labs reviewed; K 2.5, phos 2.3, mag 2.6, alk phos WNL, Na 150, Cl 117, BUN 73, creat 2.58, Ca 7.3, GFR 24, CBGs 130-173,  Meds reviewed; D5 @ 60 (214 kcals), KCl, NaPhos,   Diet Order:  TPN (CLINIMIX) Adult without lytes Diet NPO time specified  Skin:  Wound (see  comment) (Stg I pu Nare)  Last BM:  6/14  Height:   Ht Readings from Last 1 Encounters:  07/09/15 '5\' 11"'  (1.803 m)    Weight:   Wt Readings from Last 1 Encounters:  07/10/15 195 lb 1.7 oz (88.5 kg)    Ideal Body Weight:  78 kg  BMI:  Body mass index is 27.22 kg/(m^2).  Estimated Nutritional Needs:   Kcal:  2380  Protein:  130-140 g   Fluid:  Per MD  EDUCATION NEEDS:   No education needs identified at this time  MerGeoffery LyonsS Lakelandetetic Intern Pager [phone]

## 2015-07-10 NOTE — Progress Notes (Signed)
Subjective:  Patient required intubation last night- also fever- 1400 of UOP overnight- K still low and na still high Objective Vital signs in last 24 hours: Filed Vitals:   07/10/15 0715 07/10/15 0730 07/10/15 0742 07/10/15 0748  BP: 115/61 109/57  109/57  Pulse: 57 55  63  Temp:      TempSrc:      Resp: '28 29  22  ' Height:      Weight:      SpO2: 100% 100% 100% 100%   Weight change: 2 kg (4 lb 6.5 oz)  Intake/Output Summary (Last 24 hours) at 07/10/15 0748 Last data filed at 07/10/15 0700  Gross per 24 hour  Intake 4086.45 ml  Output   2335 ml  Net 1751.45 ml    Assessment/ Plan: Pt is a 70 y.o. yo male who was admitted on 07/09/2015 with  AAA repair with resultant A on CRF   Assessment/Plan: 1. AAA- POD #9 repair- per vasc  2. Renal- baseline crt 1.3- A on CRF after procedure- nonoliguric- has not required any dialysis- numbers stable - no need for dialysis 3. Anemia- hgb dropping- not to transfusion threshold just yet 4. HTN/volume- BP marginal- on metoprolol and PRNs- req sedation could be affecting- could be dry  5. Hypernatremia- improving on D5- will increase rate because do not think he is wet 6. Hypokalemia- needs more aggressive repletion- unfortunately cannot use his gut- will order another 60 meq for a total of 120 meq given today   [name] A    Labs: Basic Metabolic Panel:  Recent Labs Lab 07/08/15 0400 07/09/15 0411 07/10/15 0525  NA 156* 153* 150*  K 3.0* 2.8* 2.5*  CL 118* 118* 117*  CO2 '29 27 25  ' GLUCOSE 146* 146* 181*  BUN 72* 70* 73*  CREATININE 2.69* 2.50* 2.58*  CALCIUM 8.4* 8.0* 7.3*  PHOS 3.1 2.8 2.3*   Liver Function Tests:  Recent Labs Lab 07/05/15 0315  07/08/15 0400 07/09/15 0411 07/10/15 0525  AST 155*  --   --  109* 78*  ALT 148*  --   --  135* 108*  ALKPHOS 54  --   --  60 47  BILITOT 1.7*  --   --  2.1* 2.2*  PROT 5.3*  --   --  5.5* 4.9*  ALBUMIN 2.2*  < > 2.3* 2.2* 1.7*  < > = values in this interval not  displayed. No results for input(s): LIPASE, AMYLASE in the last 168 hours. No results for input(s): AMMONIA in the last 168 hours. CBC:  Recent Labs Lab 07/06/15 0405 07/07/15 0417 07/08/15 0657 07/09/15 0411 07/09/15 0938 07/10/15 0525  WBC 8.7 11.4* 10.5 13.0*  --  13.8*  NEUTROABS  --   --   --  10.5*  --   --   HGB 9.8* 10.3* 12.7* 10.5* 10.9* 9.1*  HCT 31.2* 32.8* 40.7 34.1* 34.5* 29.2*  MCV 92.6 93.7 94.2 96.1  --  95.1  PLT 160 227 240 305  --  289   Cardiac Enzymes: No results for input(s): CKTOTAL, CKMB, CKMBINDEX, TROPONINI in the last 168 hours. CBG:  Recent Labs Lab 07/09/15 1130 07/09/15 1618 07/09/15 1918 07/09/15 2343 07/10/15 0345  GLUCAP 164* 147* 144* 145* 173*    Iron Studies: No results for input(s): IRON, TIBC, TRANSFERRIN, FERRITIN in the last 72 hours. Studies/Results: Dg Chest Port 1 View  07/09/2015  CLINICAL DATA:  Post intubation EXAM: PORTABLE CHEST 1 VIEW COMPARISON:  07/09/2015 FINDINGS: Interval placement  of an endotracheal tube with tip measuring 5.2 cm above the carina. Enteric tube tip is off the field of view but below the left hemidiaphragm. Right central venous catheter with tip over the mid SVC region. No pneumothorax. Shallow inspiration with linear atelectasis in the lung bases and mid lung regions. No blunting of costophrenic angles. No pneumothorax. Calcification of the aorta. Normal heart size. IMPRESSION: Appliances appear in satisfactory position. Shallow inspiration with linear atelectasis in the lung bases. Electronically Signed   By: Lucienne Capers M.D.   On: 07/09/2015 22:51   Dg Chest Port 1 View  07/09/2015  CLINICAL DATA:  Respiratory distress EXAM: PORTABLE CHEST - 1 VIEW COMPARISON:  07/05/2015 FINDINGS: Patient has been extubated. Nasogastric tube and right IJ central line stable in position. Improved aeration with significant resolution of the previously noted bibasilar atelectasis. Heart size upper limits normal.   Mildly tortuous thoracic aorta. No effusion. Visualized bones unremarkable. IMPRESSION: 1. Extubation, with improved bibasilar aeration. Electronically Signed   By: Lucrezia Europe M.D.   On: 07/09/2015 20:33   Medications: Infusions: . dextrose 60 mL/hr at 07/10/15 0700  . heparin 1,300 Units/hr (07/10/15 0700)  . phenylephrine (NEO-SYNEPHRINE) Adult infusion 30 mcg/min (07/10/15 0700)  . propofol (DIPRIVAN) infusion 20 mcg/kg/min (07/10/15 0700)  . TPN (CLINIMIX) Adult without lytes 60 mL/hr at 07/10/15 0700    Scheduled Medications: . antiseptic oral rinse  7 mL Mouth Rinse 10 times per day  . chlorhexidine gluconate (SAGE KIT)  15 mL Mouth Rinse BID  . dextrose  1 ampule Intravenous Once  . docusate  100 mg Per Tube Daily  . feeding supplement (PRO-STAT SUGAR FREE 64)  60 mL Per Tube BID  . insulin aspart  0-15 Units Subcutaneous Q4H  . ipratropium-albuterol  3 mL Nebulization Q4H  . metoprolol tartrate  25 mg Per Tube BID  . OLANZapine zydis  5 mg Oral Daily  . pantoprazole sodium  40 mg Per Tube Daily  . potassium chloride  10 mEq Intravenous Q1 Hr x 6    have reviewed scheduled and prn medications.  Physical Exam: General: sedated on vent Heart: RRR Lungs: mostly clear Abdomen: soft to my exam Extremities: no peripheral edema    07/10/2015,7:48 AM  LOS: 9 days

## 2015-07-10 NOTE — Progress Notes (Signed)
PULMONARY / CRITICAL CARE MEDICINE   Name: [name] MRN: [id number] DOB: [birth year]    ADMISSION DATE:  2015-07-30 CONSULTATION DATE:  2015-07-30  REFERRING MD:  Dr. Darrick Penna  CHIEF COMPLAINT:  Vent Management   HISTORY OF PRESENT ILLNESS:  70 y.o. Male with a past medical history of Hypertension, CAD, AAA, High Cholesterol, Depression, Anxiety, Essential Tumor, Dementia, Asthma, COPD, former smoker, Anemia, MI, Pneumonia, and Stroke.  He was seen in February with CTA after his duplex revealed an increase in size from 4.8 cm to 5.2 cm.  The CTA revealed a 5.7 AAA.  On 5/11 he was told he was not a candidate for stent graft repair however he was a candidate for open repair.  He was taken to the OR on 6/6 for open AAA repair.  PCCM consulted on 6/6 for vent management  SUBJECTIVE:  Re-intubated overnight for ongoing tachypnea, hypoxia.  Persistent hypokalemia.   VITAL SIGNS: BP 109/57 mmHg  Pulse 63  Temp(Src) 98.9 F (37.2 C) (Oral)  Resp 22  Ht  (1.803 m)  Wt 88.5 kg (195 lb 1.7 oz)  BMI 27.22 kg/m2  SpO2 100%  HEMODYNAMICS:    VENTILATOR SETTINGS: Vent Mode:  [-] PRVC FiO2 (%):  [40 %-100 %] 40 % Set Rate:  [30 bmp] 30 bmp Vt Set:  [600 mL] 600 mL PEEP:  [5 cmH20] 5 cmH20 Plateau Pressure:  [14 cmH20-16 cmH20] 14 cmH20  INTAKE / OUTPUT:  Intake/Output Summary (Last 24 hours) at 07/10/15 0919 Last data filed at 07/10/15 0700  Gross per 24 hour  Intake 3699.2 ml  Output   2150 ml  Net 1549.2 ml   PHYSICAL EXAMINATION: General:  Chronically ill appearing male, NAD on vent  Neuro: sedated on vent, RASS -2 HEENT:  Mm dry, no JVD, NG tube  Cardiovascular:  S1s2 rrr  Lungs:  resps even non labored on vent, few scattered rhonchi  Abdomen:  Minimal bowel sounds, midline abdominal incision dressing dry and intact. Abd slightly distended, multiple bruises  Musculoskeletal:  Normal bulk, no sig edema   LABS:  BMET  Recent Labs Lab 07/08/15 0400 07/09/15 0411  07/10/15 0525  NA 156* 153* 150*  K 3.0* 2.8* 2.5*  CL 118* 118* 117*  CO2 BUN 72* 70* 73*  CREATININE 2.69* 2.50* 2.58*  GLUCOSE 146* 146* 181*   Electrolytes  Recent Labs Lab 07/08/15 0400 07/09/15 0411 07/10/15 0525  CALCIUM 8.4* 8.0* 7.3*  MG 2.7* 2.8* 2.6*  PHOS 3.1 2.8 2.3*    CBC  Recent Labs Lab 07/08/15 0657 07/09/15 0411 07/09/15 0938 07/10/15 0525  WBC 10.5 13.0*  --  13.8*  HGB 12.7* 10.5* 10.9* 9.1*  HCT 40.7 34.1* 34.5* 29.2*  PLT 240 305  --  289   Coag's No results for input(s): APTT, INR in the last 168 hours. Sepsis Markers  Recent Labs Lab 07/07/15 1500 07/08/15 0400 07/09/15 0411  PROCALCITON 0.40 0.39 0.42   ABG  Recent Labs Lab 07/05/15 0355 07/09/15 1600 07/09/15 2357  PHART 7.395 7.511* 7.406  PCO2ART 35.5 30.2* 39.6  PO2ART 76.4* 75.0* 327.0*   Liver Enzymes  Recent Labs Lab 07/05/15 0315  07/08/15 0400 07/09/15 0411 07/10/15 0525  AST 155*  --   --  109* 78*  ALT 148*  --   --  135* 108*  ALKPHOS 54  --   --  60 47  BILITOT 1.7*  --   --  2.1* 2.2*  ALBUMIN 2.2*  < > 2.3* 2.2* 1.7*  < > = values in this interval not displayed.  Cardiac Enzymes No results for input(s): TROPONINI, PROBNP in the last 168 hours.  Glucose  Recent Labs Lab 07/09/15 1130 07/09/15 1618 07/09/15 1918 07/09/15 2343 07/10/15 0345 07/10/15 0825  GLUCAP 164* 147* 144* 145* 173* 130*    Imaging Portable Chest Xray  07/10/2015  CLINICAL DATA:  Respiratory failure, intubation, hypertension, coronary artery disease post MI, COPD, type II diabetes mellitus EXAM: PORTABLE CHEST 1 VIEW COMPARISON:  Portable exam 0544 hours paired to 07/09/2015 FINDINGS: Endotracheal tube, nasogastric tube, and RIGHT jugular line unchanged. Stable heart size mediastinal contours. Increased bibasilar atelectasis. Upper lungs clear. No pleural effusion or pneumothorax. IMPRESSION: Increased bibasilar atelectasis. Electronically Signed   By: Ulyses SouthwardMark   Boles M.D.   On: 07/10/2015 08:08   Dg Chest Port 1 View  07/09/2015  CLINICAL DATA:  Post intubation EXAM: PORTABLE CHEST 1 VIEW COMPARISON:  07/09/2015 FINDINGS: Interval placement of an endotracheal tube with tip measuring 5.2 cm above the carina. Enteric tube tip is off the field of view but below the left hemidiaphragm. Right central venous catheter with tip over the mid SVC region. No pneumothorax. Shallow inspiration with linear atelectasis in the lung bases and mid lung regions. No blunting of costophrenic angles. No pneumothorax. Calcification of the aorta. Normal heart size. IMPRESSION: Appliances appear in satisfactory position. Shallow inspiration with linear atelectasis in the lung bases. Electronically Signed   By: Burman NievesWilliam  Stevens M.D.   On: 07/09/2015 22:51   Dg Chest Port 1 View  07/09/2015  CLINICAL DATA:  Respiratory distress EXAM: PORTABLE CHEST - 1 VIEW COMPARISON:  07/05/2015 FINDINGS: Patient has been extubated. Nasogastric tube and right IJ central line stable in position. Improved aeration with significant resolution of the previously noted bibasilar atelectasis. Heart size upper limits normal.  Mildly tortuous thoracic aorta. No effusion. Visualized bones unremarkable. IMPRESSION: 1. Extubation, with improved bibasilar aeration. Electronically Signed   By: Corlis Leak  Hassell M.D.   On: 07/09/2015 20:33     STUDIES:  Renal u/s 6/7>>> normal   CULTURES: None  ANTIBIOTICS: Vancomycin 6/6>>  SIGNIFICANT EVENTS: 6/6-Pt hd open AAA repair remains intubated post surgery. Course c/b coagulopathy (likely dilutional), acute renal injury, shock.   LINES/TUBES: RIJ dual lumen CVC 6/6>> L radial aline 6/6>> PIV x1>> ETT 6/6>>6/10  DISCUSSION: On 5/11 he was told he was not a candidate for stent graft repair however he was a candidate for open repair.  He was taken to the OR on 6/6 for open AAA repair.  PCCM consulted on 6/6 for vent management, shock, AKI.  Renal function improving  slowly, uop improving.  Continue lasix per renal.  Continue PS wean - may be able to extubate soon.       ASSESSMENT / PLAN:  PULMONARY A: Acute hypoxic respiratory failure- extubated 6/10 then re-intubated 6/15 Hx: COPD, former smoker  P:   Vent support - 8cc/kg  F/u CXR  F/u ABG  Duoneb   CARDIOVASCULAR A:  Hypotension, AAA open repair, Sinus Bradycardia Hx: MI, Stroke, Heart Murmur, Anginal Pain, CAD, HTN AFib with RVR -- 6/9 - quickly converted back to NSR.  Hypotension  P:  Vascular surgery following. Decrease metoprolol to 12.5 mg per tube BID with hypotension  Telemetry monitoring. Continue low dose neo gtt and titrate off as able   RENAL A:   Acute Renal failure, ATN - Scr seems to have peaked, now trending back down.  UOP picking up.   Hypernatremia  Hypokalemia - severe, persistent P:   Monitor UOP. Lasix d/c'd. DW5  Replace potassium  Na-Phos  Recheck K, phos this pm  Continue to trend BMP.  GASTROINTESTINAL A:   At Risk Protein Calorie Malnutrition - in setting of critical illness Hx: Pyloric Spasms Nausea/vomiting  P:   NPO for now per VVS. Continue TPN. NG to suction. PPI for PUP. Stool occult blood negative.  HEMATOLOGIC A:   Anemia  Hematuria Epistaxis - profuse nosebleed (nose packed per Vascular Surgery)-->packing now out  P:  Monitor s/sx for bleeding F/u CBC  Heparin per pharmacy  INFECTIOUS A:   Mild Leukocytosis  P:   Trend WBC's, fever curve  VAP prevention  Monitor off abx.  ENDOCRINE Type II DM P:   CBG monitoring q4hrs SSI Hyper/Hypoglycemic protocol as indicated   NEUROLOGIC A:   Pain Hx: Depression, Anxiety, Essential tremor, Dementia P:   RASS goal: -1 Propofol, fentanyl  Continue SL zyprexa for mood stabilization.  FAMILY  - Updates: no family available 6/15  Dirk Dress, NP 07/10/2015  9:19 AM Pager: [phone] or [phone]  Attending Note:  70 year old male s/p AAA repair who was  reintubated last night for agitation, AMS and respiratory failure.  On exam, coarse BS diffusely.  Minimize sedation to avoid delirium.  Ok with propofol for 24 hours then if needed in AM then will change to precedex.  Address hypernatremia via D5W.  Will need diureses prior to serious attempts at extubation.  Ileus and renal failure remain an issue, will continue TPN for now.  Would like to add free water but will hold until ileus resolves.    The patient is critically ill with multiple organ systems failure and requires high complexity decision making for assessment and support, frequent evaluation and titration of therapies, application of advanced monitoring technologies and extensive interpretation of multiple databases.   Critical Care Time devoted to patient care services described in this note is  35  Minutes. This time reflects time of care of this signee Dr Koren Bound. This critical care time does not reflect procedure time, or teaching time or supervisory time of PA/NP/Med student/Med Resident etc but could involve care discussion time.  Alyson Reedy, M.D. [facility] Pulmonary/Critical Care Medicine. Pager: [phone]. After hours pager: [phone].

## 2015-07-10 NOTE — Progress Notes (Signed)
ANTICOAGULATION CONSULT NOTE - Follow up Consult  Pharmacy Consult for Heparin Indication: hx CAD w/ recent stents  Allergies  Allergen Reactions  . Amoxicillin Swelling    Only happened with 500 mg dose, can handle 250 mg  . Penicillins Swelling    Face swelling overnight within the last 5 years Has patient had a PCN reaction causing immediate rash, facial/tongue/throat swelling, SOB or lightheadedness with hypotension: No Has patient had a PCN reaction causing severe rash involving mucus membranes or skin necrosis: No Has patient had a PCN reaction that required hospitalization No Has patient had a PCN reaction occurring within the last 10 years: Yes If all of the above answers are "NO", then may proceed with Cephalosporin use.   . Exelon [Rivastigmine Tartrate] Rash    Patch    Patient Measurements: Height: 5\' 11"  (180.3 cm) Weight: 195 lb 1.7 oz (88.5 kg) IBW/kg (Calculated) : 75.3  Vital Signs: Temp: 98.9 F (37.2 C) (06/15 0827) Temp Source: Oral (06/15 0827) BP: 98/53 mmHg (06/15 0900) Pulse Rate: 70 (06/15 0900)  Labs:  Recent Labs  07/08/15 0400  07/08/15 0657 07/08/15 2300 07/09/15 0411 07/09/15 0414 07/09/15 0938 07/10/15 0525  HGB  --   < > 12.7*  --  10.5*  --  10.9* 9.1*  HCT  --   < > 40.7  --  34.1*  --  34.5* 29.2*  PLT  --   --  240  --  305  --   --  289  HEPARINUNFRC  --   --   --  0.47  --  0.49  --  0.50  CREATININE 2.69*  --   --   --  2.50*  --   --  2.58*  < > = values in this interval not displayed.  Estimated Creatinine Clearance: 28.4 mL/min (by C-G formula based on Cr of 2.58).  Medications: Heparin @ 1300 units/hr  Assessment: 69yom s/p 2 overlapping stents to his circumflex (02/11/15) underwent AAA repair on 07/07/2015. Dual anti-platelet therapy with aspirin and plavix was interrupted for the procedure and patient now with significant ileus post-op and remains NPO. He was started on heparin yesterday to avoid the risk of stent  thrombosis while aspirin and plavix on hold. Heparin level is therapeutic at 0.5. Noted drop in Hgb 12.7 >>9.1 and dark tarry stool, but Stool occult blood negative.   Goal of Therapy:  Heparin level 0.3-0.7 units/ml Monitor platelets by anticoagulation protocol: Yes   Plan: 1) Continue heparin at 1300 units/hr for now 2) Daily heparin level and CBC  Bayard HuggerMei Fernie Grimm, PharmD, BCPS  Clinical Pharmacist  Pager: [phone]   07/10/2015,10:59 AM

## 2015-07-10 NOTE — Progress Notes (Signed)
CCM MD notified about pt's increased need for sedation. Orders received for propofol gtt. Will implement and continue to monitor.

## 2015-07-11 ENCOUNTER — Inpatient Hospital Stay (HOSPITAL_COMMUNITY): Payer: Medicare Other

## 2015-07-11 DIAGNOSIS — N179 Acute kidney failure, unspecified: Secondary | ICD-10-CM

## 2015-07-11 LAB — BLOOD GAS, ARTERIAL
ACID-BASE DEFICIT: 5.5 mmol/L — AB (ref 0.0–2.0)
BICARBONATE: 18.3 meq/L — AB (ref 20.0–24.0)
Drawn by: 41875
FIO2: 0.4
LHR: 30 {breaths}/min
MECHVT: 600 mL
O2 Saturation: 97.8 %
PEEP/CPAP: 5 cmH2O
Patient temperature: 98.6
TCO2: 19.1 mmol/L (ref 0–100)
pCO2 arterial: 29.1 mmHg — ABNORMAL LOW (ref 35.0–45.0)
pH, Arterial: 7.413 (ref 7.350–7.450)
pO2, Arterial: 119 mmHg — ABNORMAL HIGH (ref 80.0–100.0)

## 2015-07-11 LAB — GLUCOSE, CAPILLARY
GLUCOSE-CAPILLARY: 189 mg/dL — AB (ref 65–99)
GLUCOSE-CAPILLARY: 191 mg/dL — AB (ref 65–99)
Glucose-Capillary: 185 mg/dL — ABNORMAL HIGH (ref 65–99)
Glucose-Capillary: 224 mg/dL — ABNORMAL HIGH (ref 65–99)

## 2015-07-11 LAB — RENAL FUNCTION PANEL
Albumin: 1.8 g/dL — ABNORMAL LOW (ref 3.5–5.0)
Anion gap: 6 (ref 5–15)
BUN: 57 mg/dL — ABNORMAL HIGH (ref 6–20)
CHLORIDE: 113 mmol/L — AB (ref 101–111)
CO2: 21 mmol/L — ABNORMAL LOW (ref 22–32)
CREATININE: 2.32 mg/dL — AB (ref 0.61–1.24)
Calcium: 7.2 mg/dL — ABNORMAL LOW (ref 8.9–10.3)
GFR, EST AFRICAN AMERICAN: 31 mL/min — AB (ref >60)
GFR, EST NON AFRICAN AMERICAN: 27 mL/min — AB (ref >60)
Glucose, Bld: 200 mg/dL — ABNORMAL HIGH (ref 65–99)
PHOSPHORUS: 3.3 mg/dL (ref 2.5–4.6)
POTASSIUM: 2.7 mmol/L — AB (ref 3.5–5.1)
Sodium: 140 mmol/L (ref 135–145)

## 2015-07-11 LAB — CBC
HEMATOCRIT: 30.6 % — AB (ref 39.0–52.0)
Hemoglobin: 9.4 g/dL — ABNORMAL LOW (ref 13.0–17.0)
MCH: 29.2 pg (ref 26.0–34.0)
MCHC: 30.7 g/dL (ref 30.0–36.0)
MCV: 95 fL (ref 78.0–100.0)
PLATELETS: 271 10*3/uL (ref 150–400)
RBC: 3.22 MIL/uL — AB (ref 4.22–5.81)
RDW: 16.6 % — ABNORMAL HIGH (ref 11.5–15.5)
WBC: 15.6 10*3/uL — AB (ref 4.0–10.5)

## 2015-07-11 LAB — POTASSIUM: Potassium: 4.1 mmol/L (ref 3.5–5.1)

## 2015-07-11 LAB — PHOSPHORUS: PHOSPHORUS: 3.2 mg/dL (ref 2.5–4.6)

## 2015-07-11 LAB — PROCALCITONIN: PROCALCITONIN: 0.56 ng/mL

## 2015-07-11 LAB — URINE CULTURE: Culture: NO GROWTH

## 2015-07-11 LAB — HEPARIN LEVEL (UNFRACTIONATED): Heparin Unfractionated: 0.34 IU/mL (ref 0.30–0.70)

## 2015-07-11 LAB — MAGNESIUM: MAGNESIUM: 2.3 mg/dL (ref 1.7–2.4)

## 2015-07-11 MED ORDER — VANCOMYCIN HCL 10 G IV SOLR
1250.0000 mg | INTRAVENOUS | Status: DC
  Administered 2015-07-12: 1250 mg via INTRAVENOUS
  Filled 2015-07-11 (×2): qty 1250

## 2015-07-11 MED ORDER — POTASSIUM CHLORIDE 10 MEQ/50ML IV SOLN
10.0000 meq | INTRAVENOUS | Status: AC
  Administered 2015-07-11 (×6): 10 meq via INTRAVENOUS
  Filled 2015-07-11 (×6): qty 50

## 2015-07-11 MED ORDER — POTASSIUM CHLORIDE 10 MEQ/50ML IV SOLN
10.0000 meq | INTRAVENOUS | Status: AC
  Administered 2015-07-11 (×5): 10 meq via INTRAVENOUS
  Filled 2015-07-11 (×2): qty 50

## 2015-07-11 MED ORDER — TRACE MINERALS CR-CU-MN-SE-ZN 10-1000-500-60 MCG/ML IV SOLN
INTRAVENOUS | Status: AC
  Administered 2015-07-11: 18:00:00 via INTRAVENOUS
  Filled 2015-07-11: qty 1440

## 2015-07-11 MED ORDER — VANCOMYCIN HCL 10 G IV SOLR
2000.0000 mg | Freq: Once | INTRAVENOUS | Status: AC
  Administered 2015-07-11: 2000 mg via INTRAVENOUS
  Filled 2015-07-11: qty 2000

## 2015-07-11 MED ORDER — BUDESONIDE 0.5 MG/2ML IN SUSP
0.5000 mg | Freq: Two times a day (BID) | RESPIRATORY_TRACT | Status: DC
  Administered 2015-07-11 – 2015-07-15 (×10): 0.5 mg via RESPIRATORY_TRACT
  Filled 2015-07-11 (×10): qty 2

## 2015-07-11 MED ORDER — LEVOFLOXACIN IN D5W 750 MG/150ML IV SOLN
750.0000 mg | INTRAVENOUS | Status: DC
  Administered 2015-07-11: 750 mg via INTRAVENOUS
  Filled 2015-07-11 (×2): qty 150

## 2015-07-11 NOTE — Progress Notes (Signed)
Subjective:  Now on pressors- 1800 of UOP overnight- K still low but sodium better- creatinine down Objective Vital signs in last 24 hours: Filed Vitals:   07/11/15 0615 07/11/15 0630 07/11/15 0645 07/11/15 0700  BP: _0 97/53  Pulse: 74 72 73 73  Temp:      TempSrc:      Resp: _1 Height:      Weight:      SpO2: 100% 100% 100% 100%   Weight change: 6 kg (13 lb 3.7 oz)  Intake/Output Summary (Last 24 hours) at 07/11/15 0750 Last data filed at 07/11/15 0700  Gross per 24 hour  Intake 6930.71 ml  Output   2315 ml  Net 4615.71 ml    Assessment/ Plan: Pt is a 70 y.o. yo male who was admitted on 06/30/2015 with  AAA repair with resultant A on CRF   Assessment/Plan: 1. AAA- POD #10 repair- per vasc  2. Renal- baseline crt 1.3- A on CRF after procedure- nonoliguric- has not required any dialysis- numbers stable - no need for dialysis- concern now due to hemodynamic instability and possibly UOP drop off 3. Anemia- hgb dropping- not to transfusion threshold just yet 4. HTN/volume- BP marginal- on metoprolol and PRNs- req sedation could be affecting- got 5 liters in yest so dont think is volume issue- hold metoprolol ?  5. Hypernatremia- improving on D5- will decrease rate as sodium is WNL 6. Hypokalemia- s/p 120 meq yest- due for 110 meq today - unfortunately cannot use his gut-   Kaliya Shreiner A    Labs: Basic Metabolic Panel:  Recent Labs Lab 07/09/15 0411 07/10/15 0525 07/11/15 0414  NA 153* 150* 140  K 2.8* 2.5* 2.7*  CL 118* 117* 113*  CO2 27 25 21*  GLUCOSE 146* 181* 200*  BUN 70* 73* 57*  CREATININE 2.50* 2.58* 2.32*  CALCIUM 8.0* 7.3* 7.2*  PHOS 2.8 2.3* 3.2  3.3   Liver Function Tests:  Recent Labs Lab 07/05/15 0315  07/09/15 0411 07/10/15 0525 07/11/15 0414  AST 155*  --  109* 78*  --   ALT 148*  --  135* 108*  --   ALKPHOS 54  --  60 47  --   BILITOT 1.7*  --  2.1* 2.2*  --   PROT 5.3*  --  5.5* 4.9*  --   ALBUMIN 2.2*   < > 2.2* 1.7* 1.8*  < > = values in this interval not displayed. No results for input(s): LIPASE, AMYLASE in the last 168 hours. No results for input(s): AMMONIA in the last 168 hours. CBC:  Recent Labs Lab 07/07/15 0417 07/08/15 0657 07/09/15 0411 07/09/15 0938 07/10/15 0525 07/11/15 0414  WBC 11.4* 10.5 13.0*  --  13.8* 15.6*  NEUTROABS  --   --  10.5*  --   --   --   HGB 10.3* 12.7* 10.5* 10.9* 9.1* 9.4*  HCT 32.8* 40.7 34.1* 34.5* 29.2* 30.6*  MCV 93.7 94.2 96.1  --  95.1 95.0  PLT 227 240 305  --  289 271   Cardiac Enzymes: No results for input(s): CKTOTAL, CKMB, CKMBINDEX, TROPONINI in the last 168 hours. CBG:  Recent Labs Lab 07/10/15 1130 07/10/15 1551 07/10/15 1938 07/10/15 2348 07/11/15 0359  GLUCAP 206* 177* 194* 190* 185*    Iron Studies: No results for input(s): IRON, TIBC, TRANSFERRIN, FERRITIN in the last 72 hours. Studies/Results: Portable Chest Xray  07/10/2015  CLINICAL DATA:  Respiratory failure, intubation, hypertension,  coronary artery disease post MI, COPD, type II diabetes mellitus EXAM: PORTABLE CHEST 1 VIEW COMPARISON:  Portable exam 0544 hours paired to 07/09/2015 FINDINGS: Endotracheal tube, nasogastric tube, and RIGHT jugular line unchanged. Stable heart size mediastinal contours. Increased bibasilar atelectasis. Upper lungs clear. No pleural effusion or pneumothorax. IMPRESSION: Increased bibasilar atelectasis. Electronically Signed   By: Lavonia Dana M.D.   On: 07/10/2015 08:08   Dg Chest Port 1 View  07/09/2015  CLINICAL DATA:  Post intubation EXAM: PORTABLE CHEST 1 VIEW COMPARISON:  07/09/2015 FINDINGS: Interval placement of an endotracheal tube with tip measuring 5.2 cm above the carina. Enteric tube tip is off the field of view but below the left hemidiaphragm. Right central venous catheter with tip over the mid SVC region. No pneumothorax. Shallow inspiration with linear atelectasis in the lung bases and mid lung regions. No blunting of  costophrenic angles. No pneumothorax. Calcification of the aorta. Normal heart size. IMPRESSION: Appliances appear in satisfactory position. Shallow inspiration with linear atelectasis in the lung bases. Electronically Signed   By: Lucienne Capers M.D.   On: 07/09/2015 22:51   Dg Chest Port 1 View  07/09/2015  CLINICAL DATA:  Respiratory distress EXAM: PORTABLE CHEST - 1 VIEW COMPARISON:  07/05/2015 FINDINGS: Patient has been extubated. Nasogastric tube and right IJ central line stable in position. Improved aeration with significant resolution of the previously noted bibasilar atelectasis. Heart size upper limits normal.  Mildly tortuous thoracic aorta. No effusion. Visualized bones unremarkable. IMPRESSION: 1. Extubation, with improved bibasilar aeration. Electronically Signed   By: Lucrezia Europe M.D.   On: 07/09/2015 20:33   Medications: Infusions: . sodium chloride Stopped (07/11/15 0300)  . dextrose 100 mL/hr at 07/11/15 0158  . heparin 1,300 Units/hr (07/11/15 0100)  . phenylephrine (NEO-SYNEPHRINE) Adult infusion 40 mcg/min (07/11/15 0206)  . propofol (DIPRIVAN) infusion 50 mcg/kg/min (07/11/15 0453)  . TPN (CLINIMIX) Adult without lytes 60 mL/hr at 07/11/15 0100    Scheduled Medications: . antiseptic oral rinse  7 mL Mouth Rinse 10 times per day  . chlorhexidine gluconate (SAGE KIT)  15 mL Mouth Rinse BID  . dextrose  1 ampule Intravenous Once  . docusate  100 mg Per Tube Daily  . feeding supplement (PRO-STAT SUGAR FREE 64)  60 mL Per Tube BID  . insulin aspart  0-15 Units Subcutaneous Q4H  . ipratropium-albuterol  3 mL Nebulization Q4H  . metoprolol tartrate  12.5 mg Per Tube BID  . OLANZapine zydis  5 mg Oral Daily  . potassium chloride  10 mEq Intravenous Q1 Hr x 6    have reviewed scheduled and prn medications.  Physical Exam: General: sedated on vent Heart: RRR Lungs: mostly clear Abdomen: soft to my exam Extremities: no peripheral edema    07/11/2015,7:50 AM  LOS: 10  days

## 2015-07-11 NOTE — Progress Notes (Signed)
Pharmacy Antibiotic Note  [name] is a 70 y.o. male admitted on 06/28/2015 with pneumonia.  Pharmacy has been consulted for vancomycin dosing. Pt is afebrile and WBC is elevated at 15.6. Also starting levaquin. Scr is trending down.   Plan: - Vancomycin 2gm IV x 1 then 1250mg  IV Q24H - F/u renal fxn, C&S, clinical status and trough at SS  Height: 5\' 11"  (180.3 cm) Weight: 208 lb 5.4 oz (94.5 kg) IBW/kg (Calculated) : 75.3  Temp (24hrs), Avg:98.8 F (37.1 C), Min:98.1 F (36.7 C), Max:99.8 F (37.7 C)   Recent Labs Lab 07/07/15 0417 07/08/15 0400 07/08/15 0657 07/09/15 0411 07/10/15 0525 07/11/15 0414  WBC 11.4*  --  10.5 13.0* 13.8* 15.6*  CREATININE 3.21* 2.69*  --  2.50* 2.58* 2.32*    Estimated Creatinine Clearance: 34.8 mL/min (by C-G formula based on Cr of 2.32).    Allergies  Allergen Reactions  . Amoxicillin Swelling    Only happened with 500 mg dose, can handle 250 mg  . Penicillins Swelling    Face swelling overnight within the last 5 years Has patient had a PCN reaction causing immediate rash, facial/tongue/throat swelling, SOB or lightheadedness with hypotension: No Has patient had a PCN reaction causing severe rash involving mucus membranes or skin necrosis: No Has patient had a PCN reaction that required hospitalization No Has patient had a PCN reaction occurring within the last 10 years: Yes If all of the above answers are "NO", then may proceed with Cephalosporin use.   . Exelon [Rivastigmine Tartrate] Rash    Patch    Antimicrobials this admission: Vanc 6/16>> Levaquin 6/16>>  Dose adjustments this admission: N/A  Microbiology results: 6/15 TA - GN coccobacilli 6/14 TA - pending 6/15 Urine - NEG 6/15 Blood - NGTD  Thank you for allowing pharmacy to be a part of this patient's care.  [name], [name] 07/11/2015 3:07 PM

## 2015-07-11 NOTE — Progress Notes (Addendum)
AAA Progress Note    07/11/2015 7:23 AM 10 Days Post-Op  Subjective:  Intubated and sedated; RN reports they tried to turn down sedation yesterday, but pt flailing and not following commands.  Vitals Tm 99.8 now 99.3 HR 50's-80's NSR 80's-120's systolic 100% .[id number]  Gtts: Heparin Propofol  Neo D5 @ 100cc/hr  Nutrition:   TPN  Abx: none  Filed Vitals:   07/11/15 0645 07/11/15 0700  BP: 94/61 97/53  Pulse: 73 73  Temp:    Resp: 24 31    Physical Exam: Cardiac:  regular Lungs:  Coarse at bases Abdomen:  Soft, NT/ND; -BS; -BM Incisions:  Midline incision is clean and dry with staples in tact.  Minimal bloody drainage at umbilicus; left below knee incision is clean and dry Extremities:  Right great toe duskiness appears worse today; redness on dorsum of right foot proximal to great toe is unchanged.  +palpable DP and PT pulses bilaterally Neuro:  sedated  CBC    Component Value Date/Time   WBC 15.6* 07/11/2015 0414   WBC 6.9 03/18/2014 1605   RBC 3.22* 07/11/2015 0414   RBC 3.94* 03/18/2014 1605   HGB 9.4* 07/11/2015 0414   HCT 30.6* 07/11/2015 0414   PLT 271 07/11/2015 0414   MCV 95.0 07/11/2015 0414   MCH 29.2 07/11/2015 0414   MCH 33.0 03/18/2014 1605   MCHC 30.7 07/11/2015 0414   MCHC 34.7 03/18/2014 1605   RDW 16.6* 07/11/2015 0414   RDW 14.3 03/18/2014 1605   LYMPHSABS 1.2 07/09/2015 0411   LYMPHSABS 1.9 03/18/2014 1605   MONOABS 1.3* 07/09/2015 0411   EOSABS 0.0 07/09/2015 0411   EOSABS 0.3 03/18/2014 1605   BASOSABS 0.0 07/09/2015 0411   BASOSABS 0.0 03/18/2014 1605    BMET    Component Value Date/Time   NA 140 07/11/2015 0414   NA 143 03/18/2014 1605   K 2.7* 07/11/2015 0414   CL 113* 07/11/2015 0414   CO2 21* 07/11/2015 0414   GLUCOSE 200* 07/11/2015 0414   GLUCOSE 93 03/18/2014 1605   BUN 57* 07/11/2015 0414   BUN 16 03/18/2014 1605   CREATININE 2.32* 07/11/2015 0414   CALCIUM 7.2* 07/11/2015 0414   GFRNONAA 27* 07/11/2015  0414   GFRAA 31* 07/11/2015 0414    INR    Component Value Date/Time   INR 1.24 07/03/2015 0305     Intake/Output Summary (Last 24 hours) at 07/11/15 0723 Last data filed at 07/11/15 0700  Gross per 24 hour  Intake 6930.71 ml  Output   2315 ml  Net 4615.71 ml   Cultures: Urine: in progress Blood: No growth x 12 hrs Respiratory: Specimen Description TRACHEAL ASPIRATE   Special Requests NONE   Gram Stain ABUNDANT WBC PRESENT, PREDOMINANTLY PMN  ABUNDANT GRAM NEGATIVE COCCOBACILLI       Culture PENDING   Report Status PENDING         NGT output:  500cc/24hr   Assessment/Plan:  70 y.o. 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  If unable to wean in the next few days, may require a trach -hypotension-continues to require Neo--wean as tolerated -ID: leukocytosis worsening with low grade fever:  Gram stain for respiratory:  abundant GN coccobacilli-start Abx -ARF-creatinine improving -continue to monitor right great toe-duskiness appears to be worsening--he does have palpable pedal pulses -  acute surgical blood loss anemia-stable -persistent hypokalemia-supplementing today -hypernatremia-corrected -GI;  Still no BM and no bowel sounds--continue TPN   Doreatha MassedSamantha Rhyne, PA-C [facility] [phone] 07/11/2015 7:23 AM   Appreciate critical care medicine's assistance. Antibiotic regimen changed. Still with a large NG output.

## 2015-07-11 NOTE — Progress Notes (Signed)
eLink Physician-Brief Progress Note Patient Name: Wyatt CarrelRalph C Miller DOB: 12/26/1945 MRN: [id number]   Date of Service  07/11/2015  HPI/Events of Note  Intermittent hypoxia.  eICU Interventions  Will increase PEEP to 8.     Intervention Category Major Interventions: Other:  Javell Blackburn 07/11/2015, 4:18 PM

## 2015-07-11 NOTE — Progress Notes (Signed)
[place] NOTE  Pharmacy Consult for TPN Indication: prolonged ileus  Allergies  Allergen Reactions  . Amoxicillin Swelling    Only happened with 500 mg dose, can handle 250 mg  . Penicillins Swelling    Face swelling overnight within the last 5 years Has patient had a PCN reaction causing immediate rash, facial/tongue/throat swelling, SOB or lightheadedness with hypotension: No Has patient had a PCN reaction causing severe rash involving mucus membranes or skin necrosis: No Has patient had a PCN reaction that required hospitalization No Has patient had a PCN reaction occurring within the last 10 years: Yes If all of the above answers are "NO", then may proceed with Cephalosporin use.   . Exelon [Rivastigmine Tartrate] Rash    Patch    Patient Measurements: Height: '5\' 11"'  (180.3 cm) Weight: 208 lb 5.4 oz (94.5 kg) IBW/kg (Calculated) : 75.3  Vital Signs: Temp: 99.3 F (37.4 C) (06/16 0400) Temp Source: Oral (06/16 0400) BP: 97/53 mmHg (06/16 0700) Pulse Rate: 73 (06/16 0700) Intake/Output from previous day: 06/15 0701 - 06/16 0700 In: 6930.7 [I.V.:4022.7; NG/GT:300; IV Piggyback:1050; TPN:1558] Out: 2315 [Urine:1815; Emesis/NG output:500] Intake/Output from this shift:    Labs:  Recent Labs  07/09/15 0411 07/09/15 0938 07/10/15 0525 07/11/15 0414  WBC 13.0*  --  13.8* 15.6*  HGB 10.5* 10.9* 9.1* 9.4*  HCT 34.1* 34.5* 29.2* 30.6*  PLT 305  --  289 271     Recent Labs  07/09/15 0410 07/09/15 0411 07/10/15 0525 07/11/15 0414  NA  --  153* 150* 140  K  --  2.8* 2.5* 2.7*  CL  --  118* 117* 113*  CO2  --  27 25 21*  GLUCOSE  --  146* 181* 200*  BUN  --  70* 73* 57*  CREATININE  --  2.50* 2.58* 2.32*  CALCIUM  --  8.0* 7.3* 7.2*  MG  --  2.8* 2.6* 2.3  PHOS  --  2.8 2.3* 3.2  3.3  PROT  --  5.5* 4.9*  --   ALBUMIN  --  2.2* 1.7* 1.8*  AST  --  109* 78*  --   ALT  --  135* 108*  --   ALKPHOS  --  60 47  --   BILITOT  --  2.1*  2.2*  --   PREALBUMIN  --  13.1*  --   --   TRIG 164*  --   --   --    Estimated Creatinine Clearance: 34.8 mL/min (by C-G formula based on Cr of 2.32).    Recent Labs  07/10/15 1938 07/10/15 2348 07/11/15 0359  GLUCAP 194* 190* 185*   Insulin Requirements in the past 24 hours:  19 units SSI + 10 units insulin in TPN bag  Admit: admitted after CT showed increased in AAA size- not a candidate for stent graft repair, but could have open repair.  Remained intubated post-surgery until 6/10 with AKI. Now with post-op ileus to start TPN.  Surgeries/Procedures: 6/6: open AAA repair  GI: unable to tolerate TF. With NGT 522m output in last 24h.  Can't get swallow eval d/t mental status as well as ileus, now reintubated. Had large tarry stool 6/14 AM and Hgb dropped - on IV heparin with therapeutic levels. Baseline prealbumin 13.1. Docusate, Pepcid in TPN bag  Endo:  A1C 6% on 6/6. CBGs 173-200 with SSI  Lytes: has been having hypernatremia and hypokalemia- Na improved at 140- significant drop from 150 yesterday.  K continues to remain low at 2.7- patient received a total of 12 runs yesterday and this did not increase the level by much, Mag normal at 2.3, Phos stable at 3.3, CorCa ~9.1.  Renal: AKI- SCr currently 2.32- decreased. UOP remains good at 0.64m/kg/hr- no acute needs for HD  Pulm: extubated 6/10 and was on [place], then had increased work of breathing and was reintubated late evening on 6/14- 40% FiO2  Cards: BP soft- requiring Neo @ 414m/kg/min, HR brady-nml- Lopressor suspension decreased to 12.26m2mID, but has not been given d/t low BPs and pressor requirements  Hepatobil: AST 78- improved, ALT 108- improved, Alk phos remains nml at 47, TBili up at 2.2. Albumin low at 1.7. Trigs 164  Neuro: very restless and agitated. Started on Zyprexa. Requiring sedation with propofol- currently running at 79m52mg/min = 26mL20m Discussed with Dr. de DiCorrie Dandyn is to keep propofol.  ID:   WBC 15.6, afebrile. resp gram stain shows GN coccobacilli- sending PCT and MRSA PCR- may add vanc/Zosyn if indicative of infection.   Best Practices: subq hep, PPI PT, MC  TPN Access: RIJ CVC double lumen placed 6/6>> TPN start date: 6/13>>  Current Nutrition:  Clinimix NO LYTES 5/15 @ 60mL/43mrovides 72g protein and 1022kcal   +D5W @ 100mL/h37mrovides 408kcal in a 24h period   +Prostat 60mL PT80m- each 30mL pro33ms 15g protein and            72kcal (has missed some doses)   +propofol at 26mL/hr- 18mofol contains 1.1kcal per mL =             ~686kcal in a 24h period at the same rate TOTAL/24h if receives all of the above as ordered: 132g protein and 2404kcal  Nutritional Goals: (per RD recommendation on 6/15) 2380 kCal, 130-140 grams of protein per day  Plan:  -KCl 10mEq runs61mafter current runs completed.  -Change Clinimix to lyte containing formula - continue 5/15 at 60mL/hr as 59ment is receiving a significant amount of kcal from propofol and D5W infusion. Will hold IV lipids at this time as patient is critically ill in the ICU. D#4/7 of holding lipids (however, receiving lipid supplementation through propofol).This formula will provide 72g protein and 1022kcal in a 24h period.  -MVI and trace elements in TPN -Increase regular insulin to 15 units in TPN bag. Continue moderate SSI and CBGs q4h -Pepcid 40mg in TPN 6ml allow MD to determine MIVF- currently D5W @ 79mL/hr -Rena49mnction panel and magnesium ordered for the morning  Osceola Holian D. Rashmi Tallent, PharmD, BCPS Clinical Pharmacist Pager: [phone] 6/16/[phone] AM

## 2015-07-11 NOTE — Progress Notes (Signed)
PULMONARY / CRITICAL CARE MEDICINE   Name: BRIANA NEWMAN MRN: [id number] DOB: [birth year]    ADMISSION DATE:   CONSULTATION DATE:  06/30/2015  REFERRING MD:  Dr. Darrick Penna  CHIEF COMPLAINT:  Vent Management   HISTORY OF PRESENT ILLNESS:  70 y.o. Male with a past medical history of Hypertension, CAD, AAA, High Cholesterol, Depression, Anxiety, Essential Tumor, Dementia, Asthma, COPD, former smoker, Anemia, MI, Pneumonia, and Stroke.  He was seen in February with CTA after his duplex revealed an increase in size from 4.8 cm to 5.2 cm.  The CTA revealed a 5.7 AAA.  On 5/11 he was told he was not a candidate for stent graft repair however he was a candidate for open repair.  He was taken to the OR on 6/6 for open AAA repair.  PCCM consulted on 6/6 for vent management  SUBJECTIVE:  Re-intubated 2nights ago for ongoing tachypnea, hypoxia.   No issues overnight.  Pt looks uncomfortable (belly breathing) this am with sedation off. Still tachypneic. (-) inc in ET secretions.    VITAL SIGNS: BP 97/53 mmHg  Pulse 73  Temp(Src) 99.3 F (37.4 C) (Oral)  Resp 31  Ht  (1.803 m)  Wt 94.5 kg (208 lb 5.4 oz)  BMI 29.07 kg/m2  SpO2 100%  HEMODYNAMICS:    VENTILATOR SETTINGS: Vent Mode:  [-] PRVC FiO2 (%):  [40 %] 40 % Set Rate:  [30 bmp] 30 bmp Vt Set:  [600 mL] 600 mL PEEP:  [5 cmH20] 5 cmH20 Plateau Pressure:  [16 cmH20-26 cmH20] 26 cmH20  INTAKE / OUTPUT:  Intake/Output Summary (Last 24 hours) at 07/11/15 0755 Last data filed at 07/11/15 0700  Gross per 24 hour  Intake 6880.71 ml  Output   2315 ml  Net 4565.71 ml   PHYSICAL EXAMINATION: General:  Chronically ill appearing male, NAD on vent. Uncomfortable, tachypneic just off sedation.  Neuro: sedated on vent, RASS -2 HEENT:  Mm dry, no JVD, NG tube  Cardiovascular:  S1s2 rrr  Lungs:  resps even  labored on vent, few scattered rhonchi. Crackles at bases Abdomen:  Minimal bowel sounds, midline abdominal incision  dressing dry and intact. Abd slightly distended, multiple bruises  Musculoskeletal:  Normal bulk, Gr 1  edema . Mottling in legs are better per RN but still has cyanotic toes.   LABS:  BMET  Recent Labs Lab 07/09/15 0411 07/10/15 0525 07/11/15 0414  NA 153* 150* 140  K 2.8* 2.5* 2.7*  CL 118* 117* 113*  CO2 27 25 21*  BUN 70* 73* 57*  CREATININE 2.50* 2.58* 2.32*  GLUCOSE 146* 181* 200*   Electrolytes  Recent Labs Lab 07/09/15 0411 07/10/15 0525 07/11/15 0414  CALCIUM 8.0* 7.3* 7.2*  MG 2.8* 2.6* 2.3  PHOS 2.8 2.3* 3.2  3.3    CBC  Recent Labs Lab 07/09/15 0411 07/09/15 0938 07/10/15 0525 07/11/15 0414  WBC 13.0*  --  13.8* 15.6*  HGB 10.5* 10.9* 9.1* 9.4*  HCT 34.1* 34.5* 29.2* 30.6*  PLT 305  --  289 271   Coag's No results for input(s): APTT, INR in the last 168 hours. Sepsis Markers  Recent Labs Lab 07/07/15 1500 07/08/15 0400 07/09/15 0411  PROCALCITON 0.40 0.39 0.42   ABG  Recent Labs Lab 07/09/15 1600 07/09/15 2357 07/11/15 0450  PHART 7.511* 7.406 7.413  PCO2ART 30.2* 39.6 29.1*  PO2ART 75.0* 327.0* 119*   Liver Enzymes  Recent Labs Lab 07/05/15 0315  07/09/15 0411 07/10/15 0525 07/11/15 0414  AST 155*  --  109* 78*  --   ALT 148*  --  135* 108*  --   ALKPHOS 54  --  60 47  --   BILITOT 1.7*  --  2.1* 2.2*  --   ALBUMIN 2.2*  < > 2.2* 1.7* 1.8*  < > = values in this interval not displayed.  Cardiac Enzymes No results for input(s): TROPONINI, PROBNP in the last 168 hours.  Glucose  Recent Labs Lab 07/10/15 0825 07/10/15 1130 07/10/15 1551 07/10/15 1938 07/10/15 2348 07/11/15 0359  GLUCAP 130* 206* 177* 194* 190* 185*    Imaging No results found.   STUDIES:  Renal u/s 6/7>>> normal   CULTURES: Blood 6/14 > Sputum 6/14 > MRSA screen 6/16 >   ANTIBIOTICS: Vancomycin 6/6>> ??  SIGNIFICANT EVENTS: 6/6-Pt hd open AAA repair remains intubated post surgery. Course c/b coagulopathy (likely dilutional),  acute renal injury, shock.   LINES/TUBES: RIJ dual lumen CVC 6/6>> L radial aline 6/6>> PIV x1>> ETT 6/6>>6/10  DISCUSSION: On 5/11 he was told he was not a candidate for stent graft repair however he was a candidate for open repair.  He was taken to the OR on 6/6 for open AAA repair.  PCCM consulted on 6/6 for vent management, shock, AKI.  Renal function improving slowly, uop improving.  Continue lasix per renal.  Continue PS wean   ASSESSMENT / PLAN:  PULMONARY A: Acute hypoxic respiratory failure- extubated 6/10 then re-intubated 6/15 Looks uncomfortable and still tachypneic on 6/16 just off sedation. CXR 6/16 atelectasis +/- infiltrate in LLL Hx: COPD, former smoker  P:   Vent support - 8cc/kg  Duoneb. Add pulmicort. Not sure if pt has HCAP, L base. Will send for MRSA screen. F/u cultures. Send for PCT.  If data are suggestive of infection, consider adding Vanc/Zosyn.    CARDIOVASCULAR A:  Hypotension, AAA open repair, Sinus Bradycardia Hx: MI, Stroke, Heart Murmur, Anginal Pain, CAD, HTN AFib with RVR -- 6/9 - quickly converted back to NSR.  Hypotension  P:  Vascular surgery following. Decrease metoprolol to 12.5 mg per tube BID with hypotension  Telemetry monitoring. Continue low dose neo gtt and titrate off as able  On heparin drip as well.   RENAL A:   Acute Renal failure, ATN - Scr seems to have peaked, now trending back down.  UOP picking up.   CKD st 3A Hypernatremia  Hypokalemia - severe, persistent P:   Monitor UOP. Lasix d/c'd. DW5 has been dc'd Replace potassium  Needs diuresis once electorlytes are corerected.    GASTROINTESTINAL A:   Severe malnutrition Hx: Pyloric Spasms Nausea/vomiting  P:   NPO for now per VVS. Continue TPN. NG to suction. PPI for PUP. Stool occult blood negative.  HEMATOLOGIC A:   Anemia  Hematuria Epistaxis - profuse nosebleed (nose packed per Vascular Surgery)-->packing now out  P:  Monitor s/sx for  bleeding F/u CBC  Heparin per pharmacy  INFECTIOUS A:   Mild Leukocytosis. R/O HCAP L base P:   Trend WBC's, fever curve  Check PCT. Check MRSA screen (none seen in system) VAP prevention  Monitor off abx. Low threshold for starting abx.   ENDOCRINE Type II DM P:   CBG monitoring q4hrs SSI Hyper/Hypoglycemic protocol as indicated   NEUROLOGIC A:   Pain Hx: Depression, Anxiety, Essential tremor, Dementia P:   RASS goal: -1 Propofol, fentanyl  Continue SL zyprexa for mood stabilization.  FAMILY  - Updates: no family available 6/16.  Critical care time spent on this pt today is 35 minutes.   Pollie Meyer, MD 07/11/2015, 7:55 AM [facility] Pager [phone] After 3 pm or if no answer, call [phone]

## 2015-07-11 NOTE — Progress Notes (Signed)
eLink Physician-Brief Progress Note Patient Name: Wyatt CarrelRalph C Arenas DOB: 09/05/1945 MRN: [id number]   Date of Service  07/11/2015  HPI/Events of Note  Hypokalemia  eICU Interventions  Potassium replaced     Intervention Category Intermediate Interventions: Electrolyte abnormality - evaluation and management  Bernece Gall 07/11/2015, 5:13 AM

## 2015-07-11 NOTE — Progress Notes (Signed)
07/11/2015 0900 Noted MRSA PCR screen collected on 06/25/15 and was negative pre-op. Duplicate order for MRSA PCR screen d/c per protocol.  Hans Rusher, Blanchard KelchStephanie Ingold

## 2015-07-11 NOTE — Progress Notes (Signed)
07/11/2015 5:04 PM Nursing note Dr. Craige CottaSood paged and made aware of pt. RR continued to be elevated over ventilator settings consistently ranging from 32-39.  (PRVC 40%/Rate 30/Peep 8). Pt. Calm and resting in bed. Propofol at max of 50 mcg and pt receiving prn Fentanyl pushes Q2H prn. No new orders at this time. Will continue to closely monitor patient.  Avedis Bevis, Blanchard KelchStephanie Ingold

## 2015-07-11 NOTE — Progress Notes (Signed)
07/11/2015 10:19 AM Received call report from Radiology, NGT in mid-esophagus. Needed to be advanced. NGT advanced and placement verified by auscultation. Will continue to closely monitor patient.  Hildy Nicholl, Blanchard KelchStephanie Ingold

## 2015-07-11 NOTE — Progress Notes (Signed)
ANTICOAGULATION CONSULT NOTE - Follow up Consult  Pharmacy Consult for Heparin Indication: hx CAD w/ recent stents  Allergies  Allergen Reactions  . Amoxicillin Swelling    Only happened with 500 mg dose, can handle 250 mg  . Penicillins Swelling    Face swelling overnight within the last 5 years Has patient had a PCN reaction causing immediate rash, facial/tongue/throat swelling, SOB or lightheadedness with hypotension: No Has patient had a PCN reaction causing severe rash involving mucus membranes or skin necrosis: No Has patient had a PCN reaction that required hospitalization No Has patient had a PCN reaction occurring within the last 10 years: Yes If all of the above answers are "NO", then may proceed with Cephalosporin use.   . Exelon [Rivastigmine Tartrate] Rash    Patch    Patient Measurements: Height: 5\' 11"  (180.3 cm) Weight: 208 lb 5.4 oz (94.5 kg) IBW/kg (Calculated) : 75.3  Vital Signs: Temp: 99.3 F (37.4 C) (06/16 0400) Temp Source: Oral (06/16 0400) BP: 119/59 mmHg (06/16 0833) Pulse Rate: 83 (06/16 0833)  Labs:  Recent Labs  07/09/15 0411 07/09/15 0414 07/09/15 [id number] 07/10/15 0525 07/11/15 0414 07/11/15 0415  HGB 10.5*  --  10.9* 9.1* 9.4*  --   HCT 34.1*  --  34.5* 29.2* 30.6*  --   PLT 305  --   --  289 271  --   HEPARINUNFRC  --  0.49  --  0.50  --  0.34  CREATININE 2.50*  --   --  2.58* 2.32*  --     Estimated Creatinine Clearance: 34.8 mL/min (by C-G formula based on Cr of 2.32).  Medications: Heparin @ 1300 units/hr  Assessment: 69yom s/p 2 overlapping stents to his circumflex (02/11/15) underwent AAA repair on 06/26/2015. Dual anti-platelet therapy with aspirin and plavix was interrupted for the procedure and patient now with significant ileus post-op and remains NPO. He was started on heparin to avoid the risk of stent thrombosis while aspirin and plavix on hold. Heparin level is therapeutic at 0.34. H/H and platelets are stable.   Goal of  Therapy:  Heparin level 0.3-0.7 units/ml Monitor platelets by anticoagulation protocol: Yes   Plan: 1) Continue heparin at 1300 units/hr for now 2) Daily heparin level and CBC  Lysle Pearlachel Arden Tinoco, PharmD, BCPS Pager # [phone] 07/11/2015 8:55 AM

## 2015-07-11 NOTE — Progress Notes (Signed)
07/11/2015  1400 Pt. Calm, resting, cooperative throughout morning. Bilateral soft wrist restraints removed at 1400. Safety mittens left in place bilaterally. Will continue to closely monitor patient.  Bodee Lafoe, Blanchard KelchStephanie Ingold

## 2015-07-11 NOTE — Progress Notes (Signed)
07/11/2015 1450 Unable to withdraw blood from distal or proximal port of RIJ. IV team notified to assess site. Unable to draw sample. Phlebotomy contacted to please collect labs. Will continue to closely monitor patient.  Issak Goley, Blanchard KelchStephanie Ingold

## 2015-07-11 NOTE — Progress Notes (Signed)
07/11/2015 1615 Pt. With intermittent decreases in SPO2 into mid 80's, on 40% FiO2 with return back to 90-94.  SPO2 HR correlating with monitor. Pt. Repositioned and airway suctioned. Pt. Still with intermittent decreases in SPO2. Respiratory and CCM made aware. Orders received and ventilator changes made by RT. Will continue to closely monitor patient.  Mechele Kittleson, Blanchard KelchStephanie Ingold

## 2015-07-11 NOTE — Progress Notes (Signed)
07/11/2015 11:33 AM Nursing note AM blood gas results relayed to Dr. Nickola Majore Dois and verbal order received ok to keep ventilator settings at a rate of 30. Orders updated in EPIC. Will continue to closely monitor patient.  Siyon Linck, Blanchard KelchStephanie Ingold

## 2015-07-12 ENCOUNTER — Other Ambulatory Visit: Payer: Self-pay

## 2015-07-12 DIAGNOSIS — R6521 Severe sepsis with septic shock: Secondary | ICD-10-CM

## 2015-07-12 DIAGNOSIS — A419 Sepsis, unspecified organism: Secondary | ICD-10-CM

## 2015-07-12 LAB — CBC
HEMATOCRIT: 34.2 % — AB (ref 39.0–52.0)
Hemoglobin: 10.3 g/dL — ABNORMAL LOW (ref 13.0–17.0)
MCH: 29 pg (ref 26.0–34.0)
MCHC: 30.1 g/dL (ref 30.0–36.0)
MCV: 96.3 fL (ref 78.0–100.0)
PLATELETS: 226 10*3/uL (ref 150–400)
RBC: 3.55 MIL/uL — AB (ref 4.22–5.81)
RDW: 16.8 % — AB (ref 11.5–15.5)
WBC: 18.8 10*3/uL — AB (ref 4.0–10.5)

## 2015-07-12 LAB — MAGNESIUM: MAGNESIUM: 2.3 mg/dL (ref 1.7–2.4)

## 2015-07-12 LAB — BLOOD GAS, ARTERIAL
Acid-base deficit: 10.1 mmol/L — ABNORMAL HIGH (ref 0.0–2.0)
BICARBONATE: 15.3 meq/L — AB (ref 20.0–24.0)
DRAWN BY: 418751
FIO2: 0.5
O2 Saturation: 98.4 %
PEEP: 8 cmH2O
Patient temperature: 98.6
RATE: 30 resp/min
TCO2: 16.3 mmol/L (ref 0–100)
VT: 600 mL
pCO2 arterial: 33.7 mmHg — ABNORMAL LOW (ref 35.0–45.0)
pH, Arterial: 7.28 — ABNORMAL LOW (ref 7.350–7.450)
pO2, Arterial: 154 mmHg — ABNORMAL HIGH (ref 80.0–100.0)

## 2015-07-12 LAB — GLUCOSE, CAPILLARY
GLUCOSE-CAPILLARY: 211 mg/dL — AB (ref 65–99)
GLUCOSE-CAPILLARY: 235 mg/dL — AB (ref 65–99)
GLUCOSE-CAPILLARY: 268 mg/dL — AB (ref 65–99)
GLUCOSE-CAPILLARY: 207 mg/dL — AB (ref 65–99)
Glucose-Capillary: 235 mg/dL — ABNORMAL HIGH (ref 65–99)
Glucose-Capillary: 294 mg/dL — ABNORMAL HIGH (ref 65–99)

## 2015-07-12 LAB — BASIC METABOLIC PANEL
Anion gap: 10 (ref 5–15)
BUN: 76 mg/dL — AB (ref 6–20)
CALCIUM: 7.4 mg/dL — AB (ref 8.9–10.3)
CO2: 16 mmol/L — ABNORMAL LOW (ref 22–32)
Chloride: 104 mmol/L (ref 101–111)
Creatinine, Ser: 3.62 mg/dL — ABNORMAL HIGH (ref 0.61–1.24)
GFR calc Af Amer: 18 mL/min — ABNORMAL LOW (ref >60)
GFR, EST NON AFRICAN AMERICAN: 16 mL/min — AB (ref >60)
GLUCOSE: 276 mg/dL — AB (ref 65–99)
Potassium: 5 mmol/L (ref 3.5–5.1)
SODIUM: 130 mmol/L — AB (ref 135–145)

## 2015-07-12 LAB — RENAL FUNCTION PANEL
ALBUMIN: 1.6 g/dL — AB (ref 3.5–5.0)
ANION GAP: 8 (ref 5–15)
BUN: 68 mg/dL — ABNORMAL HIGH (ref 6–20)
CALCIUM: 7.5 mg/dL — AB (ref 8.9–10.3)
CO2: 18 mmol/L — ABNORMAL LOW (ref 22–32)
Chloride: 106 mmol/L (ref 101–111)
Creatinine, Ser: 3.21 mg/dL — ABNORMAL HIGH (ref 0.61–1.24)
GFR, EST AFRICAN AMERICAN: 21 mL/min — AB (ref >60)
GFR, EST NON AFRICAN AMERICAN: 18 mL/min — AB (ref >60)
Glucose, Bld: 259 mg/dL — ABNORMAL HIGH (ref 65–99)
PHOSPHORUS: 5.5 mg/dL — AB (ref 2.5–4.6)
POTASSIUM: 5.3 mmol/L — AB (ref 3.5–5.1)
SODIUM: 132 mmol/L — AB (ref 135–145)

## 2015-07-12 LAB — HEPARIN LEVEL (UNFRACTIONATED): HEPARIN UNFRACTIONATED: 0.35 [IU]/mL (ref 0.30–0.70)

## 2015-07-12 LAB — PROCALCITONIN: Procalcitonin: 1.87 ng/mL

## 2015-07-12 MED ORDER — MIDAZOLAM HCL 2 MG/2ML IJ SOLN
1.0000 mg | INTRAMUSCULAR | Status: DC | PRN
  Administered 2015-07-12: 2 mg via INTRAVENOUS
  Filled 2015-07-12: qty 2

## 2015-07-12 MED ORDER — INSULIN ASPART 100 UNIT/ML ~~LOC~~ SOLN
0.0000 [IU] | SUBCUTANEOUS | Status: DC
  Administered 2015-07-12: 11 [IU] via SUBCUTANEOUS
  Administered 2015-07-12: 7 [IU] via SUBCUTANEOUS
  Administered 2015-07-12: 11 [IU] via SUBCUTANEOUS
  Administered 2015-07-13 (×2): 7 [IU] via SUBCUTANEOUS
  Administered 2015-07-13: 11 [IU] via SUBCUTANEOUS
  Administered 2015-07-13: 15 [IU] via SUBCUTANEOUS
  Administered 2015-07-13 – 2015-07-14 (×3): 7 [IU] via SUBCUTANEOUS
  Administered 2015-07-14: 11 [IU] via SUBCUTANEOUS
  Administered 2015-07-14: 15 [IU] via SUBCUTANEOUS
  Administered 2015-07-14: 11 [IU] via SUBCUTANEOUS
  Administered 2015-07-14 (×2): 7 [IU] via SUBCUTANEOUS
  Administered 2015-07-15 (×3): 4 [IU] via SUBCUTANEOUS
  Administered 2015-07-15 (×2): 7 [IU] via SUBCUTANEOUS

## 2015-07-12 MED ORDER — AMIODARONE HCL IN DEXTROSE 360-4.14 MG/200ML-% IV SOLN
60.0000 mg/h | INTRAVENOUS | Status: AC
Start: 2015-07-12 — End: 2015-07-12
  Administered 2015-07-12 (×2): 60 mg/h via INTRAVENOUS
  Filled 2015-07-12: qty 200

## 2015-07-12 MED ORDER — FENTANYL CITRATE (PF) 2500 MCG/50ML IJ SOLN
25.0000 ug/h | INTRAMUSCULAR | Status: DC
  Administered 2015-07-12: 25 ug/h via INTRAVENOUS
  Administered 2015-07-12: 200 ug/h via INTRAVENOUS
  Administered 2015-07-13: 300 ug/h via INTRAVENOUS
  Administered 2015-07-13: 175 ug/h via INTRAVENOUS
  Administered 2015-07-13: 375 ug/h via INTRAVENOUS
  Administered 2015-07-15: 75 ug/h via INTRAVENOUS
  Filled 2015-07-12 (×6): qty 50

## 2015-07-12 MED ORDER — M.V.I. ADULT IV INJ
INJECTION | INTRAVENOUS | Status: AC
  Administered 2015-07-12: 18:00:00 via INTRAVENOUS
  Filled 2015-07-12: qty 1800

## 2015-07-12 MED ORDER — AMIODARONE LOAD VIA INFUSION
150.0000 mg | Freq: Once | INTRAVENOUS | Status: AC
  Administered 2015-07-12: 150 mg via INTRAVENOUS

## 2015-07-12 MED ORDER — AMIODARONE HCL IN DEXTROSE 360-4.14 MG/200ML-% IV SOLN
INTRAVENOUS | Status: AC
  Filled 2015-07-12: qty 200

## 2015-07-12 MED ORDER — SODIUM CHLORIDE 0.9 % IV SOLN
2.0000 mg/h | INTRAVENOUS | Status: DC
  Administered 2015-07-12: 4 mg/h via INTRAVENOUS
  Administered 2015-07-12: 2 mg/h via INTRAVENOUS
  Administered 2015-07-15: 10 mg/h via INTRAVENOUS
  Administered 2015-07-15: 2 mg/h via INTRAVENOUS
  Filled 2015-07-12 (×6): qty 20

## 2015-07-12 MED ORDER — FUROSEMIDE 10 MG/ML IJ SOLN
160.0000 mg | Freq: Once | INTRAVENOUS | Status: AC
  Administered 2015-07-12: 160 mg via INTRAVENOUS
  Filled 2015-07-12: qty 16

## 2015-07-12 MED ORDER — AMIODARONE HCL IN DEXTROSE 360-4.14 MG/200ML-% IV SOLN
30.0000 mg/h | INTRAVENOUS | Status: DC
  Administered 2015-07-12 – 2015-07-15 (×6): 30 mg/h via INTRAVENOUS
  Filled 2015-07-12 (×7): qty 200

## 2015-07-12 NOTE — Progress Notes (Signed)
Heart rate 140's, respiratory rate 35-40. Dr Molli KnockYacoub notified, amiodarone drip with bolus and versed drip ordered. Continue to monitor.

## 2015-07-12 NOTE — Progress Notes (Signed)
ANTICOAGULATION CONSULT NOTE - Follow up Consult  Pharmacy Consult for Heparin Indication: hx CAD w/ recent stents  Allergies  Allergen Reactions  . Amoxicillin Swelling    Only happened with 500 mg dose, can handle 250 mg  . Penicillins Swelling    Face swelling overnight within the last 5 years Has patient had a PCN reaction causing immediate rash, facial/tongue/throat swelling, SOB or lightheadedness with hypotension: No Has patient had a PCN reaction causing severe rash involving mucus membranes or skin necrosis: No Has patient had a PCN reaction that required hospitalization No Has patient had a PCN reaction occurring within the last 10 years: Yes If all of the above answers are "NO", then may proceed with Cephalosporin use.   . Exelon [Rivastigmine Tartrate] Rash    Patch    Patient Measurements: Height: 5\' 11"  (180.3 cm) Weight: 214 lb 1.1 oz (97.1 kg) IBW/kg (Calculated) : 75.3  Vital Signs: Temp: 97.7 F (36.5 C) (06/17 0737) Temp Source: Axillary (06/17 0737) BP: 99/61 mmHg (06/17 1000) Pulse Rate: 69 (06/17 1020)  Labs:  Recent Labs  07/10/15 0525 07/11/15 0414 07/11/15 0415 07/12/15 0330  HGB 9.1* 9.4*  --  10.3*  HCT 29.2* 30.6*  --  34.2*  PLT 289 271  --  226  HEPARINUNFRC 0.50  --  0.34 0.35  CREATININE 2.58* 2.32*  --  3.21*    Estimated Creatinine Clearance: 25.4 mL/min (by C-G formula based on Cr of 3.21).  Medications: Heparin @ 1300 units/hr  Assessment: 69yom s/p 2 overlapping stents to his circumflex (02/11/15) underwent AAA repair on 07/20/2015. Dual anti-platelet therapy with aspirin and plavix was interrupted for the procedure and patient now with significant ileus post-op and remains NPO. He was started on heparin to avoid the risk of stent thrombosis while aspirin and plavix on hold.   Heparin level is therapeutic at 0.35. H/H and platelets are stable.   Goal of Therapy:  Heparin level 0.3-0.7 units/ml Monitor platelets by  anticoagulation protocol: Yes   Plan: Continue heparin 1300 units/hr Daily heparin level and CBC  Arlean Hoppingorey M. Newman PiesBall, PharmD, BCPS Clinical Pharmacist Pager [phone] 07/12/2015 10:47 AM

## 2015-07-12 NOTE — Progress Notes (Signed)
PULMONARY / CRITICAL CARE MEDICINE   Name: [name] MRN: [id number] DOB: Mar 25, 1945    ADMISSION DATE:  07/12/2015 CONSULTATION DATE:  06/27/2015  REFERRING MD:  Dr. Darrick Penna  CHIEF COMPLAINT:  Vent Management   HISTORY OF PRESENT ILLNESS:  70 y.o. Male with a past medical history of Hypertension, CAD, AAA, High Cholesterol, Depression, Anxiety, Essential Tumor, Dementia, Asthma, COPD, former smoker, Anemia, MI, Pneumonia, and Stroke.  He was seen in February with CTA after his duplex revealed an increase in size from 4.8 cm to 5.2 cm.  The CTA revealed a 5.7 AAA.  On 5/11 he was told he was not a candidate for stent graft repair however he was a candidate for open repair.  He was taken to the OR on 6/6 for open AAA repair.  PCCM consulted on 6/6 for vent management  SUBJECTIVE:  Vent asynchrony   VITAL SIGNS: BP 97/58 mmHg  Pulse 64  Temp(Src) 97.7 F (36.5 C) (Axillary)  Resp 27  Ht  (1.803 m)  Wt 97.1 kg (214 lb 1.1 oz)  BMI 29.87 kg/m2  SpO2 100%  HEMODYNAMICS:    VENTILATOR SETTINGS: Vent Mode:  [-] PRVC FiO2 (%):  [40 %-50 %] 40 % Set Rate:  [30 bmp] 30 bmp Vt Set:  [600 mL] 600 mL PEEP:  [5 cmH20-8 cmH20] 8 cmH20 Plateau Pressure:  [16 cmH20-28 cmH20] 19 cmH20  INTAKE / OUTPUT:  Intake/Output Summary (Last 24 hours) at 07/12/15 0806 Last data filed at 07/12/15 0700  Gross per 24 hour  Intake 4876.2 ml  Output   1715 ml  Net 3161.2 ml   PHYSICAL EXAMINATION: General:  Chronically ill appearing male, NAD on vent. Uncomfortable, tachypneic just off sedation.  Neuro: sedated on vent, RASS -2 HEENT:  Mm dry, no JVD, NG tube  Cardiovascular:  S1s2 rrr  Lungs:  resps even  labored on vent, few scattered rhonchi. Crackles at bases Abdomen:  Minimal bowel sounds, midline abdominal incision dressing dry and intact. Abd slightly distended, multiple bruises  Musculoskeletal:  Normal bulk, Gr 1  edema . Mottling in legs are better per RN but still has cyanotic  toes.   LABS:  BMET  Recent Labs Lab 07/10/15 0525 07/11/15 0414 07/11/15 1600 07/12/15 0330  NA 150* 140  --  132*  K 2.5* 2.7* 4.1 5.3*  CL 117* 113*  --  106  CO2 25 21*  --  18*  BUN 73* 57*  --  68*  CREATININE 2.58* 2.32*  --  3.21*  GLUCOSE 181* 200*  --  259*   Electrolytes  Recent Labs Lab 07/10/15 0525 07/11/15 0414 07/12/15 0330  CALCIUM 7.3* 7.2* 7.5*  MG 2.6* 2.3 2.3  PHOS 2.3* 3.2  3.3 5.5*    CBC  Recent Labs Lab 07/10/15 0525 07/11/15 0414 07/12/15 0330  WBC 13.8* 15.6* 18.8*  HGB 9.1* 9.4* 10.3*  HCT 29.2* 30.6* 34.2*  PLT 289 271 226   Coag's No results for input(s): APTT, INR in the last 168 hours. Sepsis Markers  Recent Labs Lab 07/09/15 0411 07/11/15 0414 07/12/15 0330  PROCALCITON 0.42 0.56 1.87   ABG  Recent Labs Lab 07/09/15 2357 07/11/15 0450 07/12/15 0424  PHART 7.406 7.413 7.280*  PCO2ART 39.6 29.1* 33.7*  PO2ART 327.0* 119* 154*   Liver Enzymes  Recent Labs Lab 07/09/15 0411 07/10/15 0525 07/11/15 0414 07/12/15 0330  AST 109* 78*  --   --   ALT 135* 108*  --   --  ALKPHOS 60 47  --   --   BILITOT 2.1* 2.2*  --   --   ALBUMIN 2.2* 1.7* 1.8* 1.6*    Cardiac Enzymes No results for input(s): TROPONINI, PROBNP in the last 168 hours.  Glucose  Recent Labs Lab 07/11/15 0359 07/11/15 0857 07/11/15 1241 07/11/15 2032 07/11/15 2318 07/12/15 0408  GLUCAP 185* 189* 224* 191* 211* 235*    Imaging No results found.   STUDIES:  Renal u/s 6/7>>> normal   CULTURES: Blood 6/14 > Sputum 6/14 > MRSA screen 6/16 >   ANTIBIOTICS: Vancomycin 6/6>> ??  SIGNIFICANT EVENTS: 6/6-Pt hd open AAA repair remains intubated post surgery. Course c/b coagulopathy (likely dilutional), acute renal injury, shock.   LINES/TUBES: RIJ dual lumen CVC 6/6>> L radial aline 6/6>> PIV x1>> ETT 6/6>>6/10  DISCUSSION: On 5/11 he was told he was not a candidate for stent graft repair however he was a candidate for  open repair.  He was taken to the OR on 6/6 for open AAA repair.  PCCM consulted on 6/6 for vent management, shock, AKI.  Renal function improving slowly, uop improving.  Continue lasix per renal.  Continue PS wean   ASSESSMENT / PLAN:  PULMONARY A: Acute hypoxic respiratory failure- extubated 6/10 then re-intubated 6/15 Looks uncomfortable and still tachypneic on 6/16 just off sedation. CXR 6/16 atelectasis +/- infiltrate in LLL Hx: COPD, former smoker  P:   Change to PCV Duoneb. Added pulmicort. Continue abx.  PCT 1.87.  CARDIOVASCULAR A:  Hypotension, AAA open repair, Sinus Bradycardia Hx: MI, Stroke, Heart Murmur, Anginal Pain, CAD, HTN AFib with RVR -- 6/9 - quickly converted back to NSR.  Hypotension  P:  Vascular surgery following. D/C lopressor. Telemetry monitoring. Continue low dose neo gtt and titrate off as able  On heparin drip as well.  D/C propofol given shock.  RENAL A:   Acute Renal failure, ATN - Scr seems to have peaked, now trending back down.  UOP picking up.   CKD st 3A Hypernatremia  Hypokalemia - severe, persistent P:   Monitor UOP. Lasix per renal IVF per renal. BMET in AM. Replace electrolytes as indicated.  GASTROINTESTINAL A:   Severe malnutrition Hx: Pyloric Spasms Nausea/vomiting  P:   NPO for now per VVS. Continue TPN. NG to suction. PPI for PUP. Stool occult blood negative.  HEMATOLOGIC A:   Anemia  Hematuria Epistaxis - profuse nosebleed (nose packed per Vascular Surgery)-->packing now out  P:  Monitor s/sx for bleeding F/u CBC  Heparin per pharmacy  INFECTIOUS A:   Mild Leukocytosis. R/O HCAP L base P:   Trend WBC's, fever curve  Levofloxacin and vanc as ordered. VAP prevention  Monitor off abx.  ENDOCRINE Type II DM P:   CBG monitoring q4hrs SSI Hyper/Hypoglycemic protocol as indicated   NEUROLOGIC A:   Pain Hx: Depression, Anxiety, Essential tremor, Dementia P:   RASS goal: -1 Start fentanyl  drip. PRN versed D/C propofol Continue SL zyprexa for mood stabilization.  FAMILY  - Updates: no family available 6/17.  The patient is critically ill with multiple organ systems failure and requires high complexity decision making for assessment and support, frequent evaluation and titration of therapies, application of advanced monitoring technologies and extensive interpretation of multiple databases.   Critical Care Time devoted to patient care services described in this note is  35  Minutes. This time reflects time of care of this signee Dr Koren BoundWesam Lenord Fralix. This critical care time does not reflect procedure time,  or teaching time or supervisory time of PA/NP/Med student/Med Resident etc but could involve care discussion time.  Rush Farmer, M.D. [facility] Pulmonary/Critical Care Medicine. Pager: [phone]. After hours pager: [phone].

## 2015-07-12 NOTE — Progress Notes (Signed)
[place] NOTE  Pharmacy Consult for TPN Indication: prolonged ileus  Allergies  Allergen Reactions  . Amoxicillin Swelling    Only happened with 500 mg dose, can handle 250 mg  . Penicillins Swelling    Face swelling overnight within the last 5 years Has patient had a PCN reaction causing immediate rash, facial/tongue/throat swelling, SOB or lightheadedness with hypotension: No Has patient had a PCN reaction causing severe rash involving mucus membranes or skin necrosis: No Has patient had a PCN reaction that required hospitalization No Has patient had a PCN reaction occurring within the last 10 years: Yes If all of the above answers are "NO", then may proceed with Cephalosporin use.   . Exelon [Rivastigmine Tartrate] Rash    Patch    Patient Measurements: Height: '5\' 11"'  (180.3 cm) Weight: 214 lb 1.1 oz (97.1 kg) IBW/kg (Calculated) : 75.3  Vital Signs: Temp: 98.4 F (36.9 C) (06/17 0410) Temp Source: Oral (06/17 0410) BP: 97/58 mmHg (06/17 0700) Pulse Rate: 64 (06/17 0700) Intake/Output from previous day: 06/16 0701 - 06/17 0700 In: 5079.1 [I.V.:2434.1; NG/GT:165; IV Piggyback:1100; TPN:1380] Out: 2706 [Urine:825; Emesis/NG output:950] Intake/Output from this shift:    Labs:  Recent Labs  07/10/15 0525 07/11/15 0414 07/12/15 0330  WBC 13.8* 15.6* 18.8*  HGB 9.1* 9.4* 10.3*  HCT 29.2* 30.6* 34.2*  PLT 289 271 226     Recent Labs  07/10/15 0525 07/11/15 0414 07/11/15 1600 07/12/15 0330  NA 150* 140  --  132*  K 2.5* 2.7* 4.1 5.3*  CL 117* 113*  --  106  CO2 25 21*  --  18*  GLUCOSE 181* 200*  --  259*  BUN 73* 57*  --  68*  CREATININE 2.58* 2.32*  --  3.21*  CALCIUM 7.3* 7.2*  --  7.5*  MG 2.6* 2.3  --  2.3  PHOS 2.3* 3.2  3.3  --  5.5*  PROT 4.9*  --   --   --   ALBUMIN 1.7* 1.8*  --  1.6*  AST 78*  --   --   --   ALT 108*  --   --   --   ALKPHOS 47  --   --   --   BILITOT 2.2*  --   --   --    Estimated Creatinine  Clearance: 25.4 mL/min (by C-G formula based on Cr of 3.21).    Recent Labs  07/11/15 2032 07/11/15 2318 07/12/15 0408  GLUCAP 191* 211* 235*   Insulin Requirements in the past 24 hours:  21 units SSI + 8 units charted at 04:00AM, 15 units insulin in TPN bag  Admit: admitted after CT showed increased in AAA size- not a candidate for stent graft repair, but could have open repair.  Remained intubated post-surgery until 6/10 with AKI. Now with post-op ileus to start TPN.  Surgeries/Procedures: 6/6: open AAA repair  GI: Still no BS/BM - unable to tolerate TF. With NGT 944m output in last 24h. No  Can't get swallow eval d/t mental status as well as ileus, now reintubated. Had large tarry stool 6/14 AM and Hgb dropped - on IV heparin with therapeutic levels. Baseline prealbumin 13.1. Docusate, Pepcid in TPN bag  Endo:  A1C 6% on 6/6. CBGs >200 with SSI and insulin in TPN  Lytes: has been having hypernatremia and hypokalemia- Now with mild hyperkalemia, hyponatremia.  Phos at 5.5 and CorCa ~9.4.  Mag 2.3   Renal: AKI- SCr trending higher  3.21- decreased. UOP and dropping 0.89m/kg/hr - down from 0.862mkg/hr  Pulm: extubated 6/10 and was on [place], then had increased work of breathing and was reintubated late evening on 6/14- Acidotic this AM - vent adjustments made.  Propofol at max dose, getting Fentanyl for vent dyssynchrony.  Cards: BP soft- requiring Neo @ 4047mkg/min, HR brady-nml- Lopressor suspension decreased to 12.5mg44mD, but has not been given d/t low BPs and pressor requirements  Hepatobil: AST 78- improved, ALT 108- improved, Alk phos remains nml at 47, TBili up at 2.2. Albumin low at 1.7. Trigs 164  Neuro: very restless and agitated. Started on Zyprexa. Requiring sedation with propofol- currently running at 50mc34m/min = 26mL/40mDiscussed with Dr. de DioCorrie Dandy is to keep propofol.  ID:  WBC 18.8, afebrile. resp gram stain shows GN coccobacilli- PCT 1.87.  MRSA PCR-  neg  On:  Levofloxacin 6/16>>   Vancomycin 6/16>>   Best Practices: subq hep, PPI PT, MC  TPN Access: RIJ CVC double lumen placed 6/6>> TPN start date: 6/13>>  Current Nutrition:  Clinimix NO LYTES 5/15 @ 75mL/h62movides 90g protein and 1278kcal   +D5W @ 50mL/hr48movides 204kcal in a 24h period   +Prostat 60mL PT 27m each 30mL prov19m 15g protein and 72kcal (has missed some doses)   +propofol at 20.8mL/hr- pr27mfol contains 1.1kcal per mL = ~ 500kcal in a 24h period at the same rate TOTAL/24h if receives all of the above as ordered: 120g protein and 2126kcal  Nutritional Goals: (per RD recommendation on 6/15) 2380 kCal, 130-140 grams of protein per day  Plan:  -Change Clinimix to non-lyte 5/15 containing formula - increase to 75mL/hr. Wi16mold IV lipids at this time as patient is critically ill in the ICU. D#5/7 of holding lipids (however, receiving lipid supplementation through propofol).This formula will provide 90g protein and 1278kcal in a 24h period.  -MVI and trace elements in TPN -Continue regular insulin to 15 units in TPN bag. Adjust SSI to resistant and CBGs q4h -Will reduce Pepcid to 20mg in TPN 1m increasing creatining -Will allow MD to determine MIVF- currently D5W @ 50mL/hr -Foll48mp labs to ensure adequate electrolytes  Ransome Helwig,Rober MinionClinical Pharmacist Pager:  [phone] allowing pharmacy to be part of this patients care team. 07/12/2015 7:18 AM

## 2015-07-12 NOTE — Progress Notes (Signed)
Subjective: Interval History: none.. Unresponsive on vent. Remains very tachypneic overbreathing the vent. Respiratory rate 30  Objective: Vital signs in last 24 hours: Temp:  [97.7 F (36.5 C)-99.5 F (37.5 C)] 97.7 F (36.5 C) (06/17 0737) Pulse Rate:  [32-94] 64 (06/17 0700) Resp:  [19-38] 27 (06/17 0700) BP: (82-202)/(47-170) 97/58 mmHg (06/17 0700) SpO2:  [90 %-100 %] 100 % (06/17 0700) FiO2 (%):  [40 %-50 %] 40 % (06/17 0421) Weight:  [214 lb 1.1 oz (97.1 kg)] 214 lb 1.1 oz (97.1 kg) (06/17 0500)  Intake/Output from previous day: 06/16 0701 - 06/17 0700 In: 5079.1 [I.V.:2434.1; NG/GT:165; IV Piggyback:1100; TPN:1380] Out: 1775 [Urine:825; Emesis/NG output:950] Intake/Output this shift:    Abdomen soft and nondistended. Plus dorsalis pedis pulses. No change in cyanosis of right great toe  Lab Results:  Recent Labs  07/11/15 0414 07/12/15 0330  WBC 15.6* 18.8*  HGB 9.4* 10.3*  HCT 30.6* 34.2*  PLT 271 226   BMET  Recent Labs  07/11/15 0414 07/11/15 1600 07/12/15 0330  NA 140  --  132*  K 2.7* 4.1 5.3*  CL 113*  --  106  CO2 21*  --  18*  GLUCOSE 200*  --  259*  BUN 57*  --  68*  CREATININE 2.32*  --  3.21*  CALCIUM 7.2*  --  7.5*    Studies/Results: US Renal  07/02/2015  CLINICAL DATA:  Acute renal failure.  Diabetes mellitus EXAM: RENAL ULTRASOUND COMPARISON:  CT abdomen and pelvis February 26, 2015 FINDINGS: Right Kidney: Length: 12.6 cm. Echogenicity and renal cortical thickness are within normal limits. No mass, perinephric fluid, or hydronephrosis visualized. No sonographically demonstrable calculus or ureterectasis. Left Kidney: Length: 11.6 cm. Echogenicity and renal echogenicity are within normal limits. No mass, perinephric fluid, or hydronephrosis visualized. No sonographically demonstrable calculus or ureterectasis. Bladder: Urinary bladder is decompressed by Foley catheter. IMPRESSION: Normal appearing kidneys bilaterally by ultrasound.  Electronically Signed   By: Bretta Bang III M.D.   On: 07/02/2015 13:54   Dg Chest Port 1 View  07/11/2015  CLINICAL DATA:  Intubation. EXAM: PORTABLE CHEST 1 VIEW COMPARISON:  07/10/2015. FINDINGS: NG tube is been retracted, its tip is in the mid esophagus. Repositioning suggested. Endotracheal tube and right IJ line stable position. Bilateral mid lung and basilar atelectasis and/or infiltrates. Slight interim clearing. No prominent pleural effusion or pneumothorax. IMPRESSION: 1. NG tube has been retracted, its tip is in the mid esophagus. Repositioning suggested. Endotracheal tube and right IJ line in stable position. 2. Persistent but improving bilateral mid lung field and bibasilar atelectasis and/or infiltrates Critical Value/emergent results were called by telephone at the time of interpretation on 07/11/2015 at 8:01 am to nurse Judeth Cornfield who verbally acknowledged these results. Electronically Signed   By: Maisie Fus  Register   On: 07/11/2015 08:04   Portable Chest Xray  07/10/2015  CLINICAL DATA:  Respiratory failure, intubation, hypertension, coronary artery disease post MI, COPD, type II diabetes mellitus EXAM: PORTABLE CHEST 1 VIEW COMPARISON:  Portable exam 0544 hours paired to 07/09/2015 FINDINGS: Endotracheal tube, nasogastric tube, and RIGHT jugular line unchanged. Stable heart size mediastinal contours. Increased bibasilar atelectasis. Upper lungs clear. No pleural effusion or pneumothorax. IMPRESSION: Increased bibasilar atelectasis. Electronically Signed   By: Ulyses Southward M.D.   On: 07/10/2015 08:08   Dg Chest Port 1 View  07/09/2015  CLINICAL DATA:  Post intubation EXAM: PORTABLE CHEST 1 VIEW COMPARISON:  07/09/2015 FINDINGS: Interval placement of an endotracheal tube with tip measuring  5.2 cm above the carina. Enteric tube tip is off the field of view but below the left hemidiaphragm. Right central venous catheter with tip over the mid SVC region. No pneumothorax. Shallow inspiration  with linear atelectasis in the lung bases and mid lung regions. No blunting of costophrenic angles. No pneumothorax. Calcification of the aorta. Normal heart size. IMPRESSION: Appliances appear in satisfactory position. Shallow inspiration with linear atelectasis in the lung bases. Electronically Signed   By: Burman Nieves M.D.   On: 07/09/2015 22:51   Dg Chest Port 1 View  07/09/2015  CLINICAL DATA:  Respiratory distress EXAM: PORTABLE CHEST - 1 VIEW COMPARISON:  07/05/2015 FINDINGS: Patient has been extubated. Nasogastric tube and right IJ central line stable in position. Improved aeration with significant resolution of the previously noted bibasilar atelectasis. Heart size upper limits normal.  Mildly tortuous thoracic aorta. No effusion. Visualized bones unremarkable. IMPRESSION: 1. Extubation, with improved bibasilar aeration. Electronically Signed   By: Corlis Leak M.D.   On: 07/09/2015 20:33   Dg Chest Port 1 View  07/05/2015  CLINICAL DATA:  Acute respiratory failure EXAM: PORTABLE CHEST 1 VIEW COMPARISON:  July 04, 2015 FINDINGS: The ETT is in good position. The NG tube terminates below today's film. A right IJ terminates in the SVC. No pneumothorax. Mild opacity in medial right lung base is probably atelectasis and vascular crowding. Suspected small effusion and underlying opacity in the left base. No other changes. IMPRESSION: Stable support apparatus. Stable opacity in the medial right lung base. Stable small effusion and underlying opacity suspected on the left. Electronically Signed   By: Gerome Sam III M.D   On: 07/05/2015 07:42   Dg Chest Port 1 View  07/04/2015  CLINICAL DATA:  Acute respiratory failure.  Intubated. EXAM: PORTABLE CHEST 1 VIEW COMPARISON:  Chest radiograph from one day prior. FINDINGS: Endotracheal tube tip is 3.7 cm above the carina. Enteric tube enters stomach with the tip not seen on this image. Right internal jugular central venous catheter terminates in the upper  third of the superior vena cava. Stable cardiomediastinal silhouette with mild cardiomegaly. No pneumothorax. Stable trace left pleural effusion. No right pleural effusion. No pulmonary edema. Stable bibasilar lung opacities. IMPRESSION: 1. Well-positioned support structures as described. 2. Stable bibasilar lung opacities, favor atelectasis . 3. Stable trace left pleural effusion. Electronically Signed   By: Delbert Phenix M.D.   On: 07/04/2015 07:47   Dg Chest Port 1 View  07/03/2015  CLINICAL DATA:  Endotracheal tube, followup, postop repair of abdominal aortic aneurysm EXAM: PORTABLE CHEST 1 VIEW COMPARISON:  Portable chest x-ray of 07/02/2015 and 07-09-15 FINDINGS: The lungs are not well aerated with bibasilar opacities most consistent with atelectasis. Small effusions cannot be excluded. Cardiomegaly is stable. Endotracheal tube remains with the tip approximately 3.1 cm above the carina. Right IJ central venous line tip overlies the mid SVC and and NG tube is present. IMPRESSION: 1. Diminished aeration with increase in bibasilar opacities most consistent with atelectasis. 2. Tip of endotracheal tube approximately 3.1 cm above the carina. Electronically Signed   By: Dwyane Dee M.D.   On: 07/03/2015 08:16   Dg Chest Port 1 View  07/02/2015  CLINICAL DATA:  Post abdominal aortic aneurysm repair EXAM: PORTABLE CHEST 1 VIEW COMPARISON:  2015-07-09 FINDINGS: Cardiomegaly again noted. Stable endotracheal and NG tube position. Stable right IJ central line position. There is right base medially platelike atelectasis or Ida Milbrath infiltrate. Left basilar atelectasis. No pulmonary edema. IMPRESSION: Cardiomegaly.  Stable support apparatus. Right base medially platelike atelectasis or Higinio Grow infiltrate. Left basilar atelectasis. No pulmonary edema. Electronically Signed   By: Natasha Mead M.D.   On: 07/02/2015 08:14   Dg Chest Port 1 View  06/26/2015  CLINICAL DATA:  Postoperative radiograph.  Central line placement. EXAM:  PORTABLE CHEST 1 VIEW COMPARISON:  12/13/2014 FINDINGS: Patient is intubated. Endotracheal tube terminates at the level of the carina. Retraction by 2-4 cm is recommended. Enteric catheter is seen overlying the expected location of gastric body. Right internal jugular approach central venous catheter terminates within the expected location of superior vena cava. Cardiomediastinal silhouette is normal. Mediastinal contours appear intact. There is no evidence of focal airspace consolidation, pleural effusion or pneumothorax. Osseous structures are without acute abnormality. Soft tissues are grossly normal. IMPRESSION: Endotracheal tube at the level of the carina. Retraction by 2-4 cm is recommended. Right internal jugular approach central venous catheter terminates within the expected location of superior vena cava. No evidence of active cardiopulmonary disease, or pneumothorax. Electronically Signed   By: Ted Mcalpine M.D.   On: 07/20/2015 15:05   Dg Abd Portable 1v  07/05/2015  CLINICAL DATA:  Enteric tube placement EXAM: PORTABLE ABDOMEN - 1 VIEW COMPARISON:  07/13/2015 abdominal radiograph FINDINGS: Enteric tube terminates in the proximal body of the stomach. Skin staples are seen in the midline abdomen. No dilated small bowel loops, pneumatosis or pneumoperitoneum. Mild bibasilar lung opacities. IMPRESSION: 1. Enteric tube terminates in the proximal body of the stomach. 2. Mild bibasilar lung opacities, favor atelectasis. Electronically Signed   By: Delbert Phenix M.D.   On: 07/05/2015 20:10   Dg Abd Portable 1v  07/17/2015  CLINICAL DATA:  Nasogastric tube placement EXAM: PORTABLE ABDOMEN - 1 VIEW COMPARISON:  None. FINDINGS: Nasogastric tube is not appreciable on current image. Bowel gas pattern is unremarkable. There are skin staples to the left of midline. IMPRESSION: Nasogastric tube not appreciable.  Bowel gas pattern unremarkable. These results will be called to the ordering clinician or  representative by the Radiologist Assistant, and communication documented in the PACS or zVision Dashboard. Electronically Signed   By: Bretta Bang III M.D.   On: 06/30/2015 15:02   Anti-infectives: Anti-infectives    Start     Dose/Rate Route Frequency Ordered Stop   07/12/15 1400  vancomycin (VANCOCIN) 1,250 mg in sodium chloride 0.9 % 250 mL IVPB     1,250 mg 166.7 mL/hr over 90 Minutes Intravenous Every 24 hours 07/11/15 1327     07/11/15 1530  levofloxacin (LEVAQUIN) IVPB 750 mg     750 mg 100 mL/hr over 90 Minutes Intravenous Every 48 hours 07/11/15 1506     07/11/15 1400  vancomycin (VANCOCIN) 2,000 mg in sodium chloride 0.9 % 500 mL IVPB     2,000 mg 250 mL/hr over 120 Minutes Intravenous  Once 07/11/15 1327 07/11/15 1607   07/05/15 0600  vancomycin (VANCOCIN) 1,500 mg in sodium chloride 0.9 % 500 mL IVPB  Status:  Discontinued     1,500 mg 250 mL/hr over 120 Minutes Intravenous Every 48 hours 07/03/15 1326 07/04/15 0758   07/03/15 0600  vancomycin (VANCOCIN) 1,500 mg in sodium chloride 0.9 % 500 mL IVPB  Status:  Discontinued     1,500 mg 250 mL/hr over 120 Minutes Intravenous Every 24 hours 07/24/2015 1937 07/03/15 1326   07/08/2015 1900  vancomycin (VANCOCIN) IVPB 1000 mg/200 mL premix     1,000 mg 200 mL/hr over 60 Minutes Intravenous Every 12 hours  06-25-2015 1426 07/02/15 0710   06-25-2015 0630  vancomycin (VANCOCIN) IVPB 1000 mg/200 mL premix     1,000 mg 200 mL/hr over 60 Minutes Intravenous To ShortStay Surgical 06/30/15 1051 06-25-2015 0810      Assessment/Plan: s/p Procedure(s): REPAIR OF JUXTARENAL ABDOMINAL AORTIC  ANEURYSM REPAIR USING 16MM HEMASHEILD GRAFT (N/A) REPAIR of inferiorvenacava LEFT POPLITEAL TO TIBIAL ARTERY EMBOLECTOMY Continued difficulty regarding respiratory status. Appreciate critical care assistance.   LOS: 11 days   EarlyTawanna Cooler, Jasean Ambrosia 07/12/2015, 8:07 AM

## 2015-07-12 NOTE — Progress Notes (Signed)
Subjective:  Remains on pressors-  UOP decreasing and creatinine rising- K  Better- in fact now high  Objective Vital signs in last 24 hours: Filed Vitals:   07/12/15 0500 07/12/15 0600 07/12/15 0700 07/12/15 0737  BP: 91/53 107/65 97/58   Pulse: 63 65 64   Temp:    97.7 F (36.5 C)  TempSrc:    Axillary  Resp: '31 19 27   ' Height:      Weight: 97.1 kg (214 lb 1.1 oz)     SpO2: 100% 100% 100%    Weight change: 2.6 kg (5 lb 11.7 oz)  Intake/Output Summary (Last 24 hours) at 07/12/15 0759 Last data filed at 07/12/15 0700  Gross per 24 hour  Intake 5029.1 ml  Output   1775 ml  Net 3254.1 ml    Assessment/ Plan: Pt is a 70 y.o. yo male who was admitted on 07/18/2015 with  AAA repair with resultant A on CRF   Assessment/Plan: 1. AAA- POD #11 repair- per vasc  2. Renal- baseline crt 1.3- A on CRF after procedure- nonoliguric- had not required any dialysis- numbers had been improvig until got hypotensive and required pressors- now renal function worsening - no need for dialysis right now- concern due to hemodynamic instability and  UOP drop off 3. Anemia- hgb stable 4. HTN/volume- BP marginal- on metoprolol and PRNs- req sedation could be affecting- got 5 liters in yest so dont think is volume issue- stop metoprolol- can try some high dose lasix 5. Hypernatremia- improving - overcorrected on D5- will stop  6. Hypokalemia- s/p 120 meq 6/15 - 110 meq 6/16 - now a little overcorrected given renal insufficiency- suspect will drift down again - will check in PM 7. Intubation and pressor requirement with rising WBC- abx started  Kabe Mckoy A    Labs: Basic Metabolic Panel:  Recent Labs Lab 07/10/15 0525 07/11/15 0414 07/11/15 1600 07/12/15 0330  NA 150* 140  --  132*  K 2.5* 2.7* 4.1 5.3*  CL 117* 113*  --  106  CO2 25 21*  --  18*  GLUCOSE 181* 200*  --  259*  BUN 73* 57*  --  68*  CREATININE 2.58* 2.32*  --  3.21*  CALCIUM 7.3* 7.2*  --  7.5*  PHOS 2.3* 3.2  3.3   --  5.5*   Liver Function Tests:  Recent Labs Lab 07/09/15 0411 07/10/15 0525 07/11/15 0414 07/12/15 0330  AST 109* 78*  --   --   ALT 135* 108*  --   --   ALKPHOS 60 47  --   --   BILITOT 2.1* 2.2*  --   --   PROT 5.5* 4.9*  --   --   ALBUMIN 2.2* 1.7* 1.8* 1.6*   No results for input(s): LIPASE, AMYLASE in the last 168 hours. No results for input(s): AMMONIA in the last 168 hours. CBC:  Recent Labs Lab 07/08/15 0657 07/09/15 0411  07/10/15 0525 07/11/15 0414 07/12/15 0330  WBC 10.5 13.0*  --  13.8* 15.6* 18.8*  NEUTROABS  --  10.5*  --   --   --   --   HGB 12.7* 10.5*  < > 9.1* 9.4* 10.3*  HCT 40.7 34.1*  < > 29.2* 30.6* 34.2*  MCV 94.2 96.1  --  95.1 95.0 96.3  PLT 240 305  --  289 271 226  < > = values in this interval not displayed. Cardiac Enzymes: No results for input(s): CKTOTAL, CKMB, CKMBINDEX,  TROPONINI in the last 168 hours. CBG:  Recent Labs Lab 07/11/15 0857 07/11/15 1241 07/11/15 2032 07/11/15 2318 07/12/15 0408  GLUCAP 189* 224* 191* 211* 235*    Iron Studies: No results for input(s): IRON, TIBC, TRANSFERRIN, FERRITIN in the last 72 hours. Studies/Results: Dg Chest Port 1 View  07/11/2015  CLINICAL DATA:  Intubation. EXAM: PORTABLE CHEST 1 VIEW COMPARISON:  07/10/2015. FINDINGS: NG tube is been retracted, its tip is in the mid esophagus. Repositioning suggested. Endotracheal tube and right IJ line stable position. Bilateral mid lung and basilar atelectasis and/or infiltrates. Slight interim clearing. No prominent pleural effusion or pneumothorax. IMPRESSION: 1. NG tube has been retracted, its tip is in the mid esophagus. Repositioning suggested. Endotracheal tube and right IJ line in stable position. 2. Persistent but improving bilateral mid lung field and bibasilar atelectasis and/or infiltrates Critical Value/emergent results were called by telephone at the time of interpretation on 07/11/2015 at 8:01 am to nurse Colletta Maryland who verbally acknowledged  these results. Electronically Signed   By: Marcello Moores  Register   On: 07/11/2015 08:04   Medications: Infusions: . Marland KitchenTPN (CLINIMIX-E) Adult 60 mL/hr at 07/12/15 0400  . sodium chloride Stopped (07/11/15 0300)  . dextrose 50 mL/hr at 07/12/15 0400  . heparin 1,300 Units/hr (07/12/15 0700)  . phenylephrine (NEO-SYNEPHRINE) Adult infusion 50 mcg/min (07/12/15 0400)  . propofol (DIPRIVAN) infusion 40 mcg/kg/min (07/12/15 0400)    Scheduled Medications: . antiseptic oral rinse  7 mL Mouth Rinse 10 times per day  . budesonide (PULMICORT) nebulizer solution  0.5 mg Nebulization BID  . chlorhexidine gluconate (SAGE KIT)  15 mL Mouth Rinse BID  . dextrose  1 ampule Intravenous Once  . docusate  100 mg Per Tube Daily  . feeding supplement (PRO-STAT SUGAR FREE 64)  60 mL Per Tube BID  . insulin aspart  0-15 Units Subcutaneous Q4H  . ipratropium-albuterol  3 mL Nebulization Q4H  . levofloxacin (LEVAQUIN) IV  750 mg Intravenous Q48H  . metoprolol tartrate  12.5 mg Per Tube BID  . OLANZapine zydis  5 mg Oral Daily  . vancomycin  1,250 mg Intravenous Q24H    have reviewed scheduled and prn medications.  Physical Exam: General: sedated on vent Heart: RRR Lungs: mostly clear Abdomen: soft to my exam Extremities: some peripheral edema    07/12/2015,7:59 AM  LOS: 11 days

## 2015-07-13 ENCOUNTER — Inpatient Hospital Stay (HOSPITAL_COMMUNITY): Payer: Medicare Other

## 2015-07-13 DIAGNOSIS — E872 Acidosis: Secondary | ICD-10-CM

## 2015-07-13 LAB — RENAL FUNCTION PANEL
ANION GAP: 10 (ref 5–15)
Albumin: 1.4 g/dL — ABNORMAL LOW (ref 3.5–5.0)
BUN: 85 mg/dL — ABNORMAL HIGH (ref 6–20)
CO2: 14 mmol/L — AB (ref 22–32)
Calcium: 7.3 mg/dL — ABNORMAL LOW (ref 8.9–10.3)
Chloride: 104 mmol/L (ref 101–111)
Creatinine, Ser: 4.21 mg/dL — ABNORMAL HIGH (ref 0.61–1.24)
GFR calc Af Amer: 15 mL/min — ABNORMAL LOW (ref >60)
GFR calc non Af Amer: 13 mL/min — ABNORMAL LOW (ref >60)
GLUCOSE: 305 mg/dL — AB (ref 65–99)
Phosphorus: 6.7 mg/dL — ABNORMAL HIGH (ref 2.5–4.6)
Potassium: 4.6 mmol/L (ref 3.5–5.1)
SODIUM: 128 mmol/L — AB (ref 135–145)

## 2015-07-13 LAB — HEPARIN LEVEL (UNFRACTIONATED)
HEPARIN UNFRACTIONATED: 0.2 [IU]/mL — AB (ref 0.30–0.70)
Heparin Unfractionated: 0.26 IU/mL — ABNORMAL LOW (ref 0.30–0.70)
Heparin Unfractionated: 0.19 IU/mL — ABNORMAL LOW (ref 0.30–0.70)

## 2015-07-13 LAB — POCT I-STAT 3, ART BLOOD GAS (G3+)
Acid-base deficit: 12 mmol/L — ABNORMAL HIGH (ref 0.0–2.0)
Bicarbonate: 13.1 mEq/L — ABNORMAL LOW (ref 20.0–24.0)
O2 SAT: 97 %
PH ART: 7.271 — AB (ref 7.350–7.450)
TCO2: 14 mmol/L (ref 0–100)
pCO2 arterial: 28.4 mmHg — ABNORMAL LOW (ref 35.0–45.0)
pO2, Arterial: 99 mmHg (ref 80.0–100.0)

## 2015-07-13 LAB — GLUCOSE, CAPILLARY
GLUCOSE-CAPILLARY: 255 mg/dL — AB (ref 65–99)
GLUCOSE-CAPILLARY: 242 mg/dL — AB (ref 65–99)
GLUCOSE-CAPILLARY: 235 mg/dL — AB (ref 65–99)
Glucose-Capillary: 220 mg/dL — ABNORMAL HIGH (ref 65–99)
Glucose-Capillary: 245 mg/dL — ABNORMAL HIGH (ref 65–99)
Glucose-Capillary: 313 mg/dL — ABNORMAL HIGH (ref 65–99)

## 2015-07-13 LAB — CBC
HEMATOCRIT: 31 % — AB (ref 39.0–52.0)
Hemoglobin: 9.6 g/dL — ABNORMAL LOW (ref 13.0–17.0)
MCH: 29.4 pg (ref 26.0–34.0)
MCHC: 31 g/dL (ref 30.0–36.0)
MCV: 94.8 fL (ref 78.0–100.0)
Platelets: 147 10*3/uL — ABNORMAL LOW (ref 150–400)
RBC: 3.27 MIL/uL — AB (ref 4.22–5.81)
RDW: 16.6 % — ABNORMAL HIGH (ref 11.5–15.5)
WBC: 6.6 10*3/uL (ref 4.0–10.5)

## 2015-07-13 LAB — CULTURE, RESPIRATORY

## 2015-07-13 LAB — TRIGLYCERIDES: TRIGLYCERIDES: 134 mg/dL (ref <150)

## 2015-07-13 LAB — CULTURE, RESPIRATORY W GRAM STAIN: Special Requests: NORMAL

## 2015-07-13 LAB — VANCOMYCIN, TROUGH: VANCOMYCIN TR: 33 ug/mL — AB (ref 10.0–20.0)

## 2015-07-13 LAB — MAGNESIUM: Magnesium: 2 mg/dL (ref 1.7–2.4)

## 2015-07-13 LAB — PROCALCITONIN: Procalcitonin: 3.01 ng/mL

## 2015-07-13 MED ORDER — TRACE MINERALS CR-CU-MN-SE-ZN 10-1000-500-60 MCG/ML IV SOLN
INTRAVENOUS | Status: AC
  Administered 2015-07-13: 18:00:00 via INTRAVENOUS
  Filled 2015-07-13: qty 1200

## 2015-07-13 MED ORDER — STERILE WATER FOR INJECTION IV SOLN
150.0000 meq | INTRAVENOUS | Status: DC
  Administered 2015-07-13 – 2015-07-14 (×2): 150 meq via INTRAVENOUS
  Filled 2015-07-13 (×4): qty 850

## 2015-07-13 MED ORDER — FUROSEMIDE 10 MG/ML IJ SOLN
160.0000 mg | Freq: Two times a day (BID) | INTRAVENOUS | Status: DC
  Administered 2015-07-13 – 2015-07-14 (×3): 160 mg via INTRAVENOUS
  Filled 2015-07-13 (×4): qty 16

## 2015-07-13 MED ORDER — HEPARIN BOLUS VIA INFUSION
2500.0000 [IU] | Freq: Once | INTRAVENOUS | Status: AC
  Administered 2015-07-13: 2500 [IU] via INTRAVENOUS
  Filled 2015-07-13: qty 2500

## 2015-07-13 MED ORDER — LEVOFLOXACIN IN D5W 500 MG/100ML IV SOLN
500.0000 mg | INTRAVENOUS | Status: DC
  Administered 2015-07-13 – 2015-07-15 (×2): 500 mg via INTRAVENOUS
  Filled 2015-07-13 (×2): qty 100

## 2015-07-13 NOTE — Progress Notes (Signed)
ANTICOAGULATION CONSULT NOTE - Follow up Consult  Pharmacy Consult for Heparin Indication: hx CAD w/ recent stents  Allergies  Allergen Reactions  . Amoxicillin Swelling    Only happened with 500 mg dose, can handle 250 mg  . Penicillins Swelling    Face swelling overnight within the last 5 years Has patient had a PCN reaction causing immediate rash, facial/tongue/throat swelling, SOB or lightheadedness with hypotension: No Has patient had a PCN reaction causing severe rash involving mucus membranes or skin necrosis: No Has patient had a PCN reaction that required hospitalization No Has patient had a PCN reaction occurring within the last 10 years: Yes If all of the above answers are "NO", then may proceed with Cephalosporin use.   . Exelon [Rivastigmine Tartrate] Rash    Patch    Patient Measurements: Height: 5\' 11"  (180.3 cm) Weight: 214 lb 1.1 oz (97.1 kg) IBW/kg (Calculated) : 75.3  Vital Signs: Temp: 99.5 F (37.5 C) (06/18 0038) Temp Source: Oral (06/18 0038) BP: 95/58 mmHg (06/18 0000) Pulse Rate: 70 (06/18 0312)  Labs:  Recent Labs  07/11/15 0414 07/11/15 0415 07/12/15 0330 07/12/15 1555 07/13/15 0314 07/13/15 0315  HGB 9.4*  --  10.3*  --  9.6*  --   HCT 30.6*  --  34.2*  --  31.0*  --   PLT 271  --  226  --  147*  --   HEPARINUNFRC  --  0.34 0.35  --   --  0.20*  CREATININE 2.32*  --  3.21* 3.62* 4.21*  --     Estimated Creatinine Clearance: 19.4 mL/min (by C-G formula based on Cr of 4.21).  Assessment: 69yom s/p 2 overlapping stents to his circumflex (02/11/15) underwent AAA repair on 07/25/2015. Dual anti-platelet therapy with aspirin and plavix was interrupted for the procedure and patient now with significant ileus post-op and remains NPO. He was started on heparin to avoid the risk of stent thrombosis while aspirin and plavix on hold. Heparin level now down to subtherapeutic on 1300 units/hr. Hgb low but stable, plt down to 147. No issues with line or  bleeding reported per RN .  Goal of Therapy:  Heparin level 0.3-0.7 units/ml Monitor platelets by anticoagulation protocol: Yes   Plan: Increase heparin to 1500 units/hr F/u 8 hr heparin level  [name], PharmD, BCPS Clinical pharmacist, pager [phone] 07/13/2015 3:40 AM

## 2015-07-13 NOTE — Progress Notes (Signed)
Subjective: Interval History: none.. Had continued to have respiratory rates in the 30s and 40s. Fentanyl and Versed drips have been increased and patient is now sedated on vent. Blood pressure 85-90 systolic.  Objective: Vital signs in last 24 hours: Temp:  [97 F (36.1 C)-100.6 F (38.1 C)] 100.6 F (38.1 C) (06/18 0734) Pulse Rate:  [26-122] 62 (06/18 0800) Resp:  [22-42] 25 (06/18 0800) BP: (75-114)/(48-77) 86/48 mmHg (06/18 0800) SpO2:  [92 %-100 %] 100 % (06/18 0800) FiO2 (%):  [40 %] 40 % (06/18 0800) Weight:  [213 lb 13.5 oz (97 kg)] 213 lb 13.5 oz (97 kg) (06/18 0402)  Intake/Output from previous day: 06/17 0701 - 06/18 0700 In: 2769.5 [I.V.:1298.5; IV Piggyback:316; TPN:1155] Out: 2425 [Urine:2175; Emesis/NG output:250] Intake/Output this shift:    Abdomen soft. 2+ posterior tibial pulses bilaterally.  Lab Results:  Recent Labs  07/12/15 0330 07/13/15 0314  WBC 18.8* 6.6  HGB 10.3* 9.6*  HCT 34.2* 31.0*  PLT 226 147*   BMET  Recent Labs  07/12/15 1555 07/13/15 0314  NA 130* 128*  K 5.0 4.6  CL 104 104  CO2 16* 14*  GLUCOSE 276* 305*  BUN 76* 85*  CREATININE 3.62* 4.21*  CALCIUM 7.4* 7.3*    Studies/Results: [place] Renal  07/02/2015  CLINICAL DATA:  Acute renal failure.  Diabetes mellitus EXAM: RENAL ULTRASOUND COMPARISON:  CT abdomen and pelvis February 26, 2015 FINDINGS: Right Kidney: Length: 12.6 cm. Echogenicity and renal cortical thickness are within normal limits. No mass, perinephric fluid, or hydronephrosis visualized. No sonographically demonstrable calculus or ureterectasis. Left Kidney: Length: 11.6 cm. Echogenicity and renal echogenicity are within normal limits. No mass, perinephric fluid, or hydronephrosis visualized. No sonographically demonstrable calculus or ureterectasis. Bladder: Urinary bladder is decompressed by Foley catheter. IMPRESSION: Normal appearing kidneys bilaterally by ultrasound. Electronically Signed   By: Bretta BangWilliam  Woodruff III  M.D.   On: 07/02/2015 13:54   Dg Chest Port 1 View  07/11/2015  CLINICAL DATA:  Intubation. EXAM: PORTABLE CHEST 1 VIEW COMPARISON:  07/10/2015. FINDINGS: NG tube is been retracted, its tip is in the mid esophagus. Repositioning suggested. Endotracheal tube and right IJ line stable position. Bilateral mid lung and basilar atelectasis and/or infiltrates. Slight interim clearing. No prominent pleural effusion or pneumothorax. IMPRESSION: 1. NG tube has been retracted, its tip is in the mid esophagus. Repositioning suggested. Endotracheal tube and right IJ line in stable position. 2. Persistent but improving bilateral mid lung field and bibasilar atelectasis and/or infiltrates Critical Value/emergent results were called by telephone at the time of interpretation on 07/11/2015 at 8:01 am to nurse Judeth CornfieldStephanie who verbally acknowledged these results. Electronically Signed   By: Maisie Fushomas  Register   On: 07/11/2015 08:04   Portable Chest Xray  07/10/2015  CLINICAL DATA:  Respiratory failure, intubation, hypertension, coronary artery disease post MI, COPD, type II diabetes mellitus EXAM: PORTABLE CHEST 1 VIEW COMPARISON:  Portable exam 0544 hours paired to 07/09/2015 FINDINGS: Endotracheal tube, nasogastric tube, and RIGHT jugular line unchanged. Stable heart size mediastinal contours. Increased bibasilar atelectasis. Upper lungs clear. No pleural effusion or pneumothorax. IMPRESSION: Increased bibasilar atelectasis. Electronically Signed   By: Ulyses SouthwardMark  Boles M.D.   On: 07/10/2015 08:08   Dg Chest Port 1 View  07/09/2015  CLINICAL DATA:  Post intubation EXAM: PORTABLE CHEST 1 VIEW COMPARISON:  07/09/2015 FINDINGS: Interval placement of an endotracheal tube with tip measuring 5.2 cm above the carina. Enteric tube tip is off the field of view but below the left  hemidiaphragm. Right central venous catheter with tip over the mid SVC region. No pneumothorax. Shallow inspiration with linear atelectasis in the lung bases and mid  lung regions. No blunting of costophrenic angles. No pneumothorax. Calcification of the aorta. Normal heart size. IMPRESSION: Appliances appear in satisfactory position. Shallow inspiration with linear atelectasis in the lung bases. Electronically Signed   By: Burman Nieves M.D.   On: 07/09/2015 22:51   Dg Chest Port 1 View  07/09/2015  CLINICAL DATA:  Respiratory distress EXAM: PORTABLE CHEST - 1 VIEW COMPARISON:  07/05/2015 FINDINGS: Patient has been extubated. Nasogastric tube and right IJ central line stable in position. Improved aeration with significant resolution of the previously noted bibasilar atelectasis. Heart size upper limits normal.  Mildly tortuous thoracic aorta. No effusion. Visualized bones unremarkable. IMPRESSION: 1. Extubation, with improved bibasilar aeration. Electronically Signed   By: Corlis Leak M.D.   On: 07/09/2015 20:33   Dg Chest Port 1 View  07/05/2015  CLINICAL DATA:  Acute respiratory failure EXAM: PORTABLE CHEST 1 VIEW COMPARISON:  July 04, 2015 FINDINGS: The ETT is in good position. The NG tube terminates below today's film. A right IJ terminates in the SVC. No pneumothorax. Mild opacity in medial right lung base is probably atelectasis and vascular crowding. Suspected small effusion and underlying opacity in the left base. No other changes. IMPRESSION: Stable support apparatus. Stable opacity in the medial right lung base. Stable small effusion and underlying opacity suspected on the left. Electronically Signed   By: Gerome Sam III M.D   On: 07/05/2015 07:42   Dg Chest Port 1 View  07/04/2015  CLINICAL DATA:  Acute respiratory failure.  Intubated. EXAM: PORTABLE CHEST 1 VIEW COMPARISON:  Chest radiograph from one day prior. FINDINGS: Endotracheal tube tip is 3.7 cm above the carina. Enteric tube enters stomach with the tip not seen on this image. Right internal jugular central venous catheter terminates in the upper third of the superior vena cava. Stable  cardiomediastinal silhouette with mild cardiomegaly. No pneumothorax. Stable trace left pleural effusion. No right pleural effusion. No pulmonary edema. Stable bibasilar lung opacities. IMPRESSION: 1. Well-positioned support structures as described. 2. Stable bibasilar lung opacities, favor atelectasis . 3. Stable trace left pleural effusion. Electronically Signed   By: Delbert Phenix M.D.   On: 07/04/2015 07:47   Dg Chest Port 1 View  07/03/2015  CLINICAL DATA:  Endotracheal tube, followup, postop repair of abdominal aortic aneurysm EXAM: PORTABLE CHEST 1 VIEW COMPARISON:  Portable chest x-ray of 07/02/2015 and July 23, 2015 FINDINGS: The lungs are not well aerated with bibasilar opacities most consistent with atelectasis. Small effusions cannot be excluded. Cardiomegaly is stable. Endotracheal tube remains with the tip approximately 3.1 cm above the carina. Right IJ central venous line tip overlies the mid SVC and and NG tube is present. IMPRESSION: 1. Diminished aeration with increase in bibasilar opacities most consistent with atelectasis. 2. Tip of endotracheal tube approximately 3.1 cm above the carina. Electronically Signed   By: Dwyane Dee M.D.   On: 07/03/2015 08:16   Dg Chest Port 1 View  07/02/2015  CLINICAL DATA:  Post abdominal aortic aneurysm repair EXAM: PORTABLE CHEST 1 VIEW COMPARISON:  July 23, 2015 FINDINGS: Cardiomegaly again noted. Stable endotracheal and NG tube position. Stable right IJ central line position. There is right base medially platelike atelectasis or Crews Mccollam infiltrate. Left basilar atelectasis. No pulmonary edema. IMPRESSION: Cardiomegaly. Stable support apparatus. Right base medially platelike atelectasis or Julis Haubner infiltrate. Left basilar atelectasis. No pulmonary edema. Electronically  Signed   By: Natasha Mead M.D.   On: 07/02/2015 08:14   Dg Chest Port 1 View  2015/07/20  CLINICAL DATA:  Postoperative radiograph.  Central line placement. EXAM: PORTABLE CHEST 1 VIEW COMPARISON:   12/13/2014 FINDINGS: Patient is intubated. Endotracheal tube terminates at the level of the carina. Retraction by 2-4 cm is recommended. Enteric catheter is seen overlying the expected location of gastric body. Right internal jugular approach central venous catheter terminates within the expected location of superior vena cava. Cardiomediastinal silhouette is normal. Mediastinal contours appear intact. There is no evidence of focal airspace consolidation, pleural effusion or pneumothorax. Osseous structures are without acute abnormality. Soft tissues are grossly normal. IMPRESSION: Endotracheal tube at the level of the carina. Retraction by 2-4 cm is recommended. Right internal jugular approach central venous catheter terminates within the expected location of superior vena cava. No evidence of active cardiopulmonary disease, or pneumothorax. Electronically Signed   By: Ted Mcalpine M.D.   On: Jul 20, 2015 15:05   Dg Abd Portable 1v  07/05/2015  CLINICAL DATA:  Enteric tube placement EXAM: PORTABLE ABDOMEN - 1 VIEW COMPARISON:  July 20, 2015 abdominal radiograph FINDINGS: Enteric tube terminates in the proximal body of the stomach. Skin staples are seen in the midline abdomen. No dilated small bowel loops, pneumatosis or pneumoperitoneum. Mild bibasilar lung opacities. IMPRESSION: 1. Enteric tube terminates in the proximal body of the stomach. 2. Mild bibasilar lung opacities, favor atelectasis. Electronically Signed   By: Delbert Phenix M.D.   On: 07/05/2015 20:10   Dg Abd Portable 1v  2015/07/20  CLINICAL DATA:  Nasogastric tube placement EXAM: PORTABLE ABDOMEN - 1 VIEW COMPARISON:  None. FINDINGS: Nasogastric tube is not appreciable on current image. Bowel gas pattern is unremarkable. There are skin staples to the left of midline. IMPRESSION: Nasogastric tube not appreciable.  Bowel gas pattern unremarkable. These results will be called to the ordering clinician or representative by the Radiologist  Assistant, and communication documented in the PACS or zVision Dashboard. Electronically Signed   By: Bretta Bang III M.D.   On: 07/20/15 15:02   Anti-infectives: Anti-infectives    Start     Dose/Rate Route Frequency Ordered Stop   07/12/15 1400  vancomycin (VANCOCIN) 1,250 mg in sodium chloride 0.9 % 250 mL IVPB     1,250 mg 166.7 mL/hr over 90 Minutes Intravenous Every 24 hours 07/11/15 1327     07/11/15 1530  levofloxacin (LEVAQUIN) IVPB 750 mg     750 mg 100 mL/hr over 90 Minutes Intravenous Every 48 hours 07/11/15 1506     07/11/15 1400  vancomycin (VANCOCIN) 2,000 mg in sodium chloride 0.9 % 500 mL IVPB     2,000 mg 250 mL/hr over 120 Minutes Intravenous  Once 07/11/15 1327 07/11/15 1607   07/05/15 0600  vancomycin (VANCOCIN) 1,500 mg in sodium chloride 0.9 % 500 mL IVPB  Status:  Discontinued     1,500 mg 250 mL/hr over 120 Minutes Intravenous Every 48 hours 07/03/15 1326 07/04/15 0758   07/03/15 0600  vancomycin (VANCOCIN) 1,500 mg in sodium chloride 0.9 % 500 mL IVPB  Status:  Discontinued     1,500 mg 250 mL/hr over 120 Minutes Intravenous Every 24 hours 2015/07/20 1937 07/03/15 1326   July 20, 2015 1900  vancomycin (VANCOCIN) IVPB 1000 mg/200 mL premix     1,000 mg 200 mL/hr over 60 Minutes Intravenous Every 12 hours 07/20/15 1426 07/02/15 0710   2015-07-20 0630  vancomycin (VANCOCIN) IVPB 1000 mg/200 mL premix  1,000 mg 200 mL/hr over 60 Minutes Intravenous To [facility]Stay [facility] 06/30/15 1051 07/03/2015 0810      Assessment/Plan: s/p Procedure(s): 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 TNA for nutrition.   LOS: 12 days   Jonathandavid Marlett 07/13/2015, 8:05 AM

## 2015-07-13 NOTE — Progress Notes (Signed)
Pharmacy called.  Heparin level decreased from last lab draw. Pharmacy putting in orders now to bolus and increase heparin gtt rate.   Heparin has not been paused, infusing fine through IV site. No apparent bleeding at this time.  Will continue to monitor.  Peyton Bottomsachel R Harlyn Italiano, RN 9:19 PM 07/13/2015

## 2015-07-13 NOTE — Progress Notes (Signed)
Subjective:  Remains on pressors-  UOP seems better- he did respond to lasix- Objective Vital signs in last 24 hours: Filed Vitals:   07/13/15 0734 07/13/15 0745 07/13/15 0800 07/13/15 0815  BP:  78/50 86/48 95/49  Pulse:  62 62   Temp: 100.6 F (38.1 C)     TempSrc: Axillary     Resp:  _0 Height:      Weight:      SpO2:  100% 100%    Weight change: -0.1 kg (-3.5 oz)  Intake/Output Summary (Last 24 hours) at 07/13/15 0816 Last data filed at 07/13/15 0800  Gross per 24 hour  Intake 2876.36 ml  Output   2575 ml  Net 301.36 ml    Assessment/ Plan: Pt is a 70 y.o. yo male who was admitted on 07/02/2015 with  AAA repair with resultant A on CRF   Assessment/Plan: 1. AAA- POD #12 repair- per vasc  2. Renal- baseline crt 1.3- A on CRF after procedure- nonoliguric- had not required any dialysis- numbers had been improvig until got hypotensive and required pressors- now renal function worsening - no absolute need for dialysis right now- UOP is better- will add some scheduled lasix  3. Anemia- hgb stable 4. HTN/volume- BP marginal- req sedation could be affecting-  dont think is volume issue- stopped metoprolol- appeared to respond to lasix challenge yest- will add back scheduled today  5. Hypernatremia- improving - overcorrected on D5- have stopped 6. Hypokalemia- s/p 120 meq 6/15 - 110 meq 6/16 - previously a little overcorrected given renal insufficiency- fine today 7. Intubation and pressor requirement with rising WBC- abx started- WBC down  8. Metabolic acidosis due to renal failure- adding some bicarb   [name] 70    Labs: Basic Metabolic Panel:  Recent Labs Lab 07/11/15 0414  07/12/15 0330 07/12/15 1555 07/13/15 0314  NA 140  --  132* 130* 128*  K 2.7*  < > 5.3* 5.0 4.6  CL 113*  --  106 104 104  CO2 21*  --  18* 16* 14*  GLUCOSE 200*  --  259* 276* 305*  BUN 57*  --  68* 76* 85*  CREATININE 2.32*  --  3.21* 3.62* 4.21*  CALCIUM 7.2*  --  7.5* 7.4*  7.3*  PHOS 3.2  3.3  --  5.5*  --  6.7*  < > = values in this interval not displayed. Liver Function Tests:  Recent Labs Lab 07/09/15 0411 07/10/15 0525 07/11/15 0414 07/12/15 0330 07/13/15 0314  AST 109* 78*  --   --   --   ALT 135* 108*  --   --   --   ALKPHOS 60 47  --   --   --   BILITOT 2.1* 2.2*  --   --   --   PROT 5.5* 4.9*  --   --   --   ALBUMIN 2.2* 1.7* 1.8* 1.6* 1.4*   No results for input(s): LIPASE, AMYLASE in the last 168 hours. No results for input(s): AMMONIA in the last 168 hours. CBC:  Recent Labs Lab 07/09/15 0411  07/10/15 0525 07/11/15 0414 07/12/15 0330 07/13/15 0314  WBC 13.0*  --  13.8* 15.6* 18.8* 6.6  NEUTROABS 10.5*  --   --   --   --   --   HGB 10.5*  < > 9.1* 9.4* 10.3* 9.6*  HCT 34.1*  < > 29.2* 30.6* 34.2* 31.0*  MCV 96.1  --  95.1 95.0  96.3 94.8  PLT 305  --  289 271 226 147*  < > = values in this interval not displayed. Cardiac Enzymes: No results for input(s): CKTOTAL, CKMB, CKMBINDEX, TROPONINI in the last 168 hours. CBG:  Recent Labs Lab 07/12/15 1153 07/12/15 1549 07/12/15 2046 07/13/15 0023 07/13/15 0428  GLUCAP 268* 294* 207* 220* 245*    Iron Studies: No results for input(s): IRON, TIBC, TRANSFERRIN, FERRITIN in the last 72 hours. Studies/Results: No results found. Medications: Infusions: . sodium chloride 10 mL/hr at 07/13/15 0800  . amiodarone 30 mg/hr (07/13/15 0808)  . fentaNYL infusion INTRAVENOUS 300 mcg/hr (07/13/15 0800)  . heparin 1,500 Units/hr (07/13/15 0800)  . midazolam (VERSED) infusion 4 mg/hr (07/13/15 0800)  . phenylephrine (NEO-SYNEPHRINE) Adult infusion 150 mcg/min (07/13/15 0808)  . TPN (CLINIMIX) Adult without lytes 25 mL/hr at 07/13/15 0808    Scheduled Medications: . antiseptic oral rinse  7 mL Mouth Rinse 10 times per day  . budesonide (PULMICORT) nebulizer solution  0.5 mg Nebulization BID  . chlorhexidine gluconate (SAGE KIT)  15 mL Mouth Rinse BID  . dextrose  1 ampule  Intravenous Once  . docusate  100 mg Per Tube Daily  . feeding supplement (PRO-STAT SUGAR FREE 64)  60 mL Per Tube BID  . insulin aspart  0-20 Units Subcutaneous Q4H  . ipratropium-albuterol  3 mL Nebulization Q4H  . levofloxacin (LEVAQUIN) IV  750 mg Intravenous Q48H  . OLANZapine zydis  5 mg Oral Daily  . vancomycin  1,250 mg Intravenous Q24H    have reviewed scheduled and prn medications.  Physical Exam: General: sedated on vent Heart: RRR Lungs: mostly clear Abdomen: soft to my exam Extremities: some peripheral edema    07/13/2015,8:16 AM  LOS: 12 days

## 2015-07-13 NOTE — Progress Notes (Signed)
PULMONARY / CRITICAL CARE MEDICINE   Name: [name] MRN: [id number] DOB: 01/11/1946    ADMISSION DATE:  07/24/2015 CONSULTATION DATE:  07/18/2015  REFERRING MD:  Dr. Darrick PennaFields  CHIEF COMPLAINT:  Vent Management   HISTORY OF PRESENT ILLNESS:  70 y.o. Male with a past medical history of Hypertension, CAD, AAA, High Cholesterol, Depression, Anxiety, Essential Tumor, Dementia, Asthma, COPD, former smoker, Anemia, MI, Pneumonia, and Stroke.  He was seen in February with CTA after his duplex revealed an increase in size from 4.8 cm to 5.2 cm.  The CTA revealed a 5.7 AAA.  On 5/11 he was told he was not a candidate for stent graft repair however he was a candidate for open repair.  He was taken to the OR on 6/6 for open AAA repair.  PCCM consulted on 6/6 for vent management  SUBJECTIVE:  No events overnight, significantly less agitated this AM with sedation.  VITAL SIGNS: BP 94/54 mmHg  Pulse 62  Temp(Src) 100.6 F (38.1 C) (Axillary)  Resp 25  Ht 5\' 11"  (1.803 m)  Wt 97 kg (213 lb 13.5 oz)  BMI 29.84 kg/m2  SpO2 100%  HEMODYNAMICS:    VENTILATOR SETTINGS: Vent Mode:  [-] PCV FiO2 (%):  [40 %] 40 % Set Rate:  [25 bmp] 25 bmp PEEP:  [5 cmH20] 5 cmH20 Plateau Pressure:  [20 cmH20-25 cmH20] 21 cmH20  INTAKE / OUTPUT:  Intake/Output Summary (Last 24 hours) at 07/13/15 [id number] Last data filed at 07/13/15 0901  Gross per 24 hour  Intake 2933.83 ml  Output   2675 ml  Net 258.83 ml   PHYSICAL EXAMINATION: General:  Chronically ill appearing male, NAD on vent. Uncomfortable, tachypneic just off sedation.  Neuro: sedated on vent, RASS -2 HEENT:  Mm dry, no JVD, NG tube  Cardiovascular:  S1s2 rrr  Lungs:  resps even  labored on vent, few scattered rhonchi. Crackles at bases Abdomen:  Minimal bowel sounds, midline abdominal incision dressing dry and intact. Abd slightly distended, multiple bruises  Musculoskeletal:  Normal bulk, Gr 1  edema . Mottling in legs are better per RN but still  has cyanotic toes.   LABS:  BMET  Recent Labs Lab 07/12/15 0330 07/12/15 1555 07/13/15 0314  NA 132* 130* 128*  K 5.3* 5.0 4.6  CL 106 104 104  CO2 18* 16* 14*  BUN 68* 76* 85*  CREATININE 3.21* 3.62* 4.21*  GLUCOSE 259* 276* 305*   Electrolytes  Recent Labs Lab 07/11/15 0414 07/12/15 0330 07/12/15 1555 07/13/15 0314  CALCIUM 7.2* 7.5* 7.4* 7.3*  MG 2.3 2.3  --  2.0  PHOS 3.2  3.3 5.5*  --  6.7*    CBC  Recent Labs Lab 07/11/15 0414 07/12/15 0330 07/13/15 0314  WBC 15.6* 18.8* 6.6  HGB 9.4* 10.3* 9.6*  HCT 30.6* 34.2* 31.0*  PLT 271 226 147*   Coag's No results for input(s): APTT, INR in the last 168 hours. Sepsis Markers  Recent Labs Lab 07/11/15 0414 07/12/15 0330 07/13/15 0314  PROCALCITON 0.56 1.87 3.01   ABG  Recent Labs Lab 07/11/15 0450 07/12/15 0424 07/13/15 0323  PHART 7.413 7.280* 7.271*  PCO2ART 29.1* 33.7* 28.4*  PO2ART 119* 154* 99.0   Liver Enzymes  Recent Labs Lab 07/09/15 0411 07/10/15 0525 07/11/15 0414 07/12/15 0330 07/13/15 0314  AST 109* 78*  --   --   --   ALT 135* 108*  --   --   --   ALKPHOS 60 47  --   --   --  BILITOT 2.1* 2.2*  --   --   --   ALBUMIN 2.2* 1.7* 1.8* 1.6* 1.4*    Cardiac Enzymes No results for input(s): TROPONINI, PROBNP in the last 168 hours.  Glucose  Recent Labs Lab 07/12/15 1153 07/12/15 1549 07/12/15 2046 07/13/15 0023 07/13/15 0428 07/13/15 0730  GLUCAP 268* 294* 207* 220* 245* 313*   Imaging Dg Chest Port 1 View  07/13/2015  CLINICAL DATA:  Patient status post ET tube placement. EXAM: PORTABLE CHEST 1 VIEW COMPARISON:  Chest radiograph 07/11/2015. FINDINGS: Right IJ central venous catheter tip projects over the superior vena cava. Enteric tube courses inferior to the diaphragm. ET tube terminates in the mid trachea. Stable enlarged cardiac and mediastinal contours. Low lung volumes. Bibasilar airspace opacities. No pleural effusion or pneumothorax. IMPRESSION: Interval  advancement enteric tube with tip coursing inferior to the diaphragm. Basilar opacities favored to represent atelectasis. Electronically Signed   By: Annia Belt M.D.   On: 07/13/2015 08:17   STUDIES:  Renal u/s 6/7>>> normal   CULTURES: Blood 6/14 > Sputum 6/14 > MRSA screen 6/16 >   ANTIBIOTICS: Vancomycin 6/6>>  Levaquin 6/14>>>  SIGNIFICANT EVENTS: 6/6-Pt hd open AAA repair remains intubated post surgery. Course c/b coagulopathy (likely dilutional), acute renal injury, shock.   LINES/TUBES: RIJ dual lumen CVC 6/6>> L radial aline 6/6>>6/10 PIV x1>> ETT 6/6>>6/10>>>6/16  DISCUSSION: On 5/11 he was told he was not a candidate for stent graft repair however he was a candidate for open repair.  He was taken to the OR on 6/6 for open AAA repair.  PCCM consulted on 6/6 for vent management, shock, AKI.  Renal function improving slowly, uop improving.  Continue lasix per renal.  Continue PS wean   ASSESSMENT / PLAN:  PULMONARY A: Acute hypoxic respiratory failure- extubated 6/10 then re-intubated 6/15 Looks uncomfortable and still tachypneic on 6/16 just off sedation. CXR 6/16 atelectasis +/- infiltrate in LLL Hx: COPD, former smoker  P:   Continue full vent support, needs volume off prior to consideration of extubation. Duoneb. Added pulmicort. Continue abx.  PCT 1.87.  CARDIOVASCULAR A:  Hypotension, AAA open repair, Sinus Bradycardia Hx: MI, Stroke, Heart Murmur, Anginal Pain, CAD, HTN AFib with RVR -- 6/9 - quickly converted back to NSR.  Hypotension  P:  Vascular surgery following. D/C lopressor. Telemetry monitoring. Continue low dose neo gtt and titrate off as able  On heparin drip.  D/C propofol given shock.  RENAL A:   Acute Renal failure, ATN - Scr seems to have peaked, now trending back down.  UOP picking up.   CKD st 3A Hypernatremia  Hypokalemia - severe, persistent P:   Monitor UOP. Lasix per renal Bicarb per renal. BMET in AM. Replace  electrolytes as indicated.  GASTROINTESTINAL A:   Severe malnutrition Hx: Pyloric Spasms Nausea/vomiting  P:   NPO for now per VVS. Continue TPN. NG to suction. PPI for PUP. Stool occult blood negative.  HEMATOLOGIC A:   Anemia  Hematuria Epistaxis - profuse nosebleed (nose packed per Vascular Surgery)-->packing now out  P:  Monitor s/sx for bleeding F/u CBC  Heparin per pharmacy  INFECTIOUS A:   Mild Leukocytosis. R/O HCAP L base P:   Trend WBC's, fever curve  Levofloxacin and vanc as ordered. VAP prevention  Monitor off abx.  ENDOCRINE Type II DM P:   CBG monitoring q4hrs SSI Hyper/Hypoglycemic protocol as indicated   NEUROLOGIC A:   Pain Hx: Depression, Anxiety, Essential tremor, Dementia P:   RASS  goal: -1 Continue fentanyl and versed drips. D/Ced propofol Continue SL zyprexa for mood stabilization.  FAMILY  - Updates: no family available 6/18.  Anticipate without significant volume negative will not be able to tolerate weaning.  Will likely require tracheostomy, no family this weekend to discuss this with.  The patient is critically ill with multiple organ systems failure and requires high complexity decision making for assessment and support, frequent evaluation and titration of therapies, application of advanced monitoring technologies and extensive interpretation of multiple databases.   Critical Care Time devoted to patient care services described in this note is  35  Minutes. This time reflects time of care of this signee Dr Koren Bound. This critical care time does not reflect procedure time, or teaching time or supervisory time of PA/NP/Med student/Med Resident etc but could involve care discussion time.  Alyson Reedy, M.D. [facility] Pulmonary/Critical Care Medicine. Pager: [phone]. After hours pager: [phone].

## 2015-07-13 NOTE — Progress Notes (Addendum)
Pharmacy Antibiotic Note  Wyatt CarrelRalph C Miller is a 70 y.o. male admitted on 06/30/2015 with pneumonia.  Pharmacy has been consulted for vancomycin dosing. Pt is febrile to 100.6, WBC has trended down to wnl, and sCr trending up to 4.21. Vancomycin trough today is supratherapeutic at 33 mcg/ml on vancomycin 1250mg  IV q24h. Will hold vancomycin and check a random level tomorrow.  Plan: Hold vancomycin Reduce levaquin to 500mg  IV q48h 24h VR  F/u renal fxn, C&S, clinical status and trough at SS  Height: 5\' 11"  (180.3 cm) Weight: 213 lb 13.5 oz (97 kg) IBW/kg (Calculated) : 75.3  Temp (24hrs), Avg:98.9 F (37.2 C), Min:97 F (36.1 C), Max:100.6 F (38.1 C)   Recent Labs Lab 07/09/15 0411 07/10/15 0525 07/11/15 0414 07/12/15 0330 07/12/15 1555 07/13/15 0314 07/13/15 1305  WBC 13.0* 13.8* 15.6* 18.8*  --  6.6  --   CREATININE 2.50* 2.58* 2.32* 3.21* 3.62* 4.21*  --   VANCOTROUGH  --   --   --   --   --   --  33*    Estimated Creatinine Clearance: 19.4 mL/min (by C-G formula based on Cr of 4.21).    Allergies  Allergen Reactions  . Amoxicillin Swelling    Only happened with 500 mg dose, can handle 250 mg  . Penicillins Swelling    Face swelling overnight within the last 5 years Has patient had a PCN reaction causing immediate rash, facial/tongue/throat swelling, SOB or lightheadedness with hypotension: No Has patient had a PCN reaction causing severe rash involving mucus membranes or skin necrosis: No Has patient had a PCN reaction that required hospitalization No Has patient had a PCN reaction occurring within the last 10 years: Yes If all of the above answers are "NO", then may proceed with Cephalosporin use.   . Exelon [Rivastigmine Tartrate] Rash    Patch    Antimicrobials this admission: Vanc 6/16>> Levaquin 6/16>>  Dose adjustments this admission: 6/18 VT: 33 on vancomycin 1250mg  q24h, hold and recheck VR tomorrow  Microbiology results: 6/14 TA - few gram variable  rod  6/15 TA - GN coccobacilli  6/15 Urine - NEG  6/15 Blood - NGTD  Arlean Hoppingorey M. Newman PiesBall, PharmD, BCPS Clinical Pharmacist Pager [phone]  07/13/2015 10:47 AM

## 2015-07-13 NOTE — Progress Notes (Signed)
[place] NOTE  Pharmacy Consult for TPN Indication: prolonged ileus  Allergies  Allergen Reactions  . Amoxicillin Swelling    Only happened with 500 mg dose, can handle 250 mg  . Penicillins Swelling    Face swelling overnight within the last 5 years Has patient had a PCN reaction causing immediate rash, facial/tongue/throat swelling, SOB or lightheadedness with hypotension: No Has patient had a PCN reaction causing severe rash involving mucus membranes or skin necrosis: No Has patient had a PCN reaction that required hospitalization No Has patient had a PCN reaction occurring within the last 10 years: Yes If all of the above answers are "NO", then may proceed with Cephalosporin use.   . Exelon [Rivastigmine Tartrate] Rash    Patch   Patient Measurements: Height: '5\' 11"'  (180.3 cm) Weight: 213 lb 13.5 oz (97 kg) IBW/kg (Calculated) : 75.3  Vital Signs: Temp: 100.6 F (38.1 C) (06/18 0734) Temp Source: Axillary (06/18 0734) BP: 83/60 mmHg (06/18 0730) Pulse Rate: 63 (06/18 0727) Intake/Output from previous day: 06/17 0701 - 06/18 0700 In: 2769.5 [I.V.:1298.5; IV Piggyback:316; TPN:1155] Out: 2425 [Urine:2175; Emesis/NG output:250]  Labs:  Recent Labs  07/11/15 0414 07/12/15 0330 07/13/15 0314  WBC 15.6* 18.8* 6.6  HGB 9.4* 10.3* 9.6*  HCT 30.6* 34.2* 31.0*  PLT 271 226 147*    Recent Labs  07/11/15 0414  07/12/15 0330 07/12/15 1555 07/13/15 0314 07/13/15 0315  NA 140  --  132* 130* 128*  --   K 2.7*  < > 5.3* 5.0 4.6  --   CL 113*  --  106 104 104  --   CO2 21*  --  18* 16* 14*  --   GLUCOSE 200*  --  259* 276* 305*  --   BUN 57*  --  68* 76* 85*  --   CREATININE 2.32*  --  3.21* 3.62* 4.21*  --   CALCIUM 7.2*  --  7.5* 7.4* 7.3*  --   MG 2.3  --  2.3  --  2.0  --   PHOS 3.2  3.3  --  5.5*  --  6.7*  --   ALBUMIN 1.8*  --  1.6*  --  1.4*  --   TRIG  --   --   --   --   --  134  < > = values in this interval not  displayed. Estimated Creatinine Clearance: 19.4 mL/min (by C-G formula based on Cr of 4.21).   Recent Labs  07/12/15 2046 07/13/15 0023 07/13/15 0428  GLUCAP 207* 220* 245*   Insulin Requirements in the past 24 hours:  48 units SSI + 15 units insulin in TPN bag  Admit: admitted after CT showed increased in AAA size- not a candidate for stent graft repair, but could have open repair.  Remained intubated post-surgery until 6/10 with AKI. Now with post-op ileus to start TPN.  Surgeries/Procedures: 6/6: open AAA repair  GI: Still no BS/BM - unable to tolerate TF. With NGT 981m output in last 24h. No  Can't get swallow eval d/t mental status as well as ileus, now reintubated. Had large tarry stool 6/14 AM and Hgb dropped - on IV heparin with therapeutic levels. Baseline prealbumin 13.1. Docusate, Pepcid in TPN bag  Endo:  A1C 6% on 6/6. CBGs >200 with SSI and insulin in TPN - Did not tolerate increase in rate yesterday - will decrease now to avoid ongoing hyperglycemia issues until new bag is hung.  Lytes:  now with hyponatremia - 128.  Phos now up to 6.7 (ratio < 50 still) and CorCa ~9.4.  Mag 2.0  Renal: AKI- SCr trending higher 3.21>> 4.21 - increased. UOP (0.4>>0.9 ml/kg/hr) - responded to Lasix  Pulm: extubated 6/10 and was on [place], then had increased work of breathing and was reintubated late evening on 1/21- Metabolic/Resp.Acidotic this AM 6/18 - vent adjustments made, renal adding bicarb.  Propofol stopped, getting Fentanyl for vent dyssynchrony.  Cards: Hypotensive but developed afib - started Amiodarone last night - requiring Phenylephrine at 130mg/min (643mhr), - Lopressor stopped  Hepatobil: AST 78- improved, ALT 108- improved, Alk phos remains nml at 47, TBili up at 2.2. Albumin low at 1.7. Trigs 164  Neuro: very restless and agitated. Started on Zyprexa - . Requiring sedation with midazolam at 73m35mr..  ID:  WBC 18.8>>6.6, now spiking temp 100.6 from 97. PCT - 1.87>>3.01.   MRSA PCR- neg  On:  Levofloxacin 6/16>>   Vancomycin 6/16>>  Resp. Culture 6/14 >>REINCUBATED FOR BETTER GROWTH Resp. Culture 6/15 >> Gm stain - ABUNDANT GRAM NEGATIVE COCCOBACILLI Blood Culture 6/15 >> NGTD Urine Culture 6/15 >> NG  Best Practices: subq hep, PPI PT, MC  TPN Access: RIJ CVC double lumen placed 6/6>> TPN start date: 6/13>>  Current Nutrition:  Clinimix NO LYTES 5/15 @ 57m15m-provides 60g protein and 842kcal   +Prostat 60mL873mBID- each 30mL 91mides 15g protein and 72kcal (has missed some doses)  Fluids: 0.9%NS at 10ml/h11m0meq S52mm Bicarb/Sterile water at 75ml/hr 37mtritional Goals: (per RD recommendation on 6/15) 2380 kCal, 130-140 grams of protein per day  Plan:  -Continue Clinimix without electrolytes 5/15  - Restart new bag at 57mL/hr t37mht. Titrate slowly to goal rate for better tolerance -Will hold IV lipids at this time as patient is critically ill in the ICU. D#6/7 of holding lipids.  This formula will provide 60g protein and 842kcal in a 24h period.  -MVI and trace elements in TPN -Increase regular insulin to 25 units in TPN bag. Continue SSI at resistant and CBGs q4h -Continue Pepcid to 20mg in TP40mth increasing creatinine -Follow up labs to ensure adequate electrolytes  Syrah Daughtrey JohnstRober MinionMS Clinical Pharmacist Pager:  [phone] allowing pharmacy to be part of this patients care team. 07/13/2015 7:43 AM

## 2015-07-13 NOTE — Progress Notes (Addendum)
ANTICOAGULATION CONSULT NOTE - Follow up Consult  Pharmacy Consult for Heparin Indication: hx CAD w/ recent stents  Allergies  Allergen Reactions  . Amoxicillin Swelling    Only happened with 500 mg dose, can handle 250 mg  . Penicillins Swelling    Face swelling overnight within the last 5 years Has patient had a PCN reaction causing immediate rash, facial/tongue/throat swelling, SOB or lightheadedness with hypotension: No Has patient had a PCN reaction causing severe rash involving mucus membranes or skin necrosis: No Has patient had a PCN reaction that required hospitalization No Has patient had a PCN reaction occurring within the last 10 years: Yes If all of the above answers are "NO", then may proceed with Cephalosporin use.   . Exelon [Rivastigmine Tartrate] Rash    Patch    Patient Measurements: Height: 5\' 11"  (180.3 cm) Weight: 213 lb 13.5 oz (97 kg) IBW/kg (Calculated) : 75.3  Vital Signs: Temp: 100.6 F (38.1 C) (06/18 0734) Temp Source: Axillary (06/18 0734) BP: 82/54 mmHg (06/18 1139) Pulse Rate: 64 (06/18 1139)  Labs:  Recent Labs  07/11/15 0414  07/12/15 0330 07/12/15 1555 07/13/15 0314 07/13/15 0315 07/13/15 1055  HGB 9.4*  --  10.3*  --  9.6*  --   --   HCT 30.6*  --  34.2*  --  31.0*  --   --   PLT 271  --  226  --  147*  --   --   HEPARINUNFRC  --   < > 0.35  --   --  0.20* 0.26*  CREATININE 2.32*  --  3.21* 3.62* 4.21*  --   --   < > = values in this interval not displayed.  Estimated Creatinine Clearance: 19.4 mL/min (by C-G formula based on Cr of 4.21).  Assessment: 69yom s/p 2 overlapping stents to his circumflex (02/11/15) underwent AAA repair on 06/29/2015. Dual anti-platelet therapy with aspirin and plavix was interrupted for the procedure and patient now with significant ileus post-op and remains NPO. He was started on heparin to avoid the risk of stent thrombosis while aspirin and plavix on hold.   Repeat HL remains subtherapeutic at 0.26  on heparin 1500 units/hr. Nurse reports no issues with infusion or bleeding.  Goal of Therapy:  Heparin level 0.3-0.7 units/ml Monitor platelets by anticoagulation protocol: Yes   Plan: Increase heparin to 1700 units/hr 8h HL Daily HL/CBC  Arlean Hoppingorey M. Newman PiesBall, PharmD, BCPS Clinical Pharmacist Pager [phone]  07/13/2015 10:46 AM  ADDN: Repeat HL is subtherapeutic at 0.19 on heparin 1700 units/hr. Nurse reports no issues with infusion or bleeding.  Plan: Bolus heparin 2500 units and increase to 2000 units/hr 8h HL  Arlean Hoppingorey M. Newman PiesBall, PharmD, BCPS Clinical Pharmacist Pager [phone]

## 2015-07-14 ENCOUNTER — Inpatient Hospital Stay (HOSPITAL_COMMUNITY): Payer: Medicare Other

## 2015-07-14 DIAGNOSIS — J189 Pneumonia, unspecified organism: Secondary | ICD-10-CM | POA: Insufficient documentation

## 2015-07-14 DIAGNOSIS — Z452 Encounter for adjustment and management of vascular access device: Secondary | ICD-10-CM

## 2015-07-14 LAB — COMPREHENSIVE METABOLIC PANEL
ALBUMIN: 1.2 g/dL — AB (ref 3.5–5.0)
ALK PHOS: 64 U/L (ref 38–126)
ALT: 77 U/L — ABNORMAL HIGH (ref 17–63)
AST: 115 U/L — AB (ref 15–41)
Anion gap: 12 (ref 5–15)
BILIRUBIN TOTAL: 2.1 mg/dL — AB (ref 0.3–1.2)
BUN: 100 mg/dL — AB (ref 6–20)
CALCIUM: 7.1 mg/dL — AB (ref 8.9–10.3)
CO2: 15 mmol/L — ABNORMAL LOW (ref 22–32)
Chloride: 95 mmol/L — ABNORMAL LOW (ref 101–111)
Creatinine, Ser: 5.56 mg/dL — ABNORMAL HIGH (ref 0.61–1.24)
GFR calc Af Amer: 11 mL/min — ABNORMAL LOW (ref >60)
GFR calc non Af Amer: 9 mL/min — ABNORMAL LOW (ref >60)
GLUCOSE: 281 mg/dL — AB (ref 65–99)
Potassium: 5.9 mmol/L — ABNORMAL HIGH (ref 3.5–5.1)
Sodium: 122 mmol/L — ABNORMAL LOW (ref 135–145)
TOTAL PROTEIN: 5 g/dL — AB (ref 6.5–8.1)

## 2015-07-14 LAB — CBC
HCT: 25.6 % — ABNORMAL LOW (ref 39.0–52.0)
HEMATOCRIT: 27.2 % — AB (ref 39.0–52.0)
HEMATOCRIT: 27.8 % — AB (ref 39.0–52.0)
Hemoglobin: 8.9 g/dL — ABNORMAL LOW (ref 13.0–17.0)
Hemoglobin: 8.4 g/dL — ABNORMAL LOW (ref 13.0–17.0)
Hemoglobin: 9.3 g/dL — ABNORMAL LOW (ref 13.0–17.0)
MCH: 29.7 pg (ref 26.0–34.0)
MCH: 29.5 pg (ref 26.0–34.0)
MCH: 29.6 pg (ref 26.0–34.0)
MCHC: 32.7 g/dL (ref 30.0–36.0)
MCHC: 32.8 g/dL (ref 30.0–36.0)
MCHC: 33.5 g/dL (ref 30.0–36.0)
MCV: 90.7 fL (ref 78.0–100.0)
MCV: 89.8 fL (ref 78.0–100.0)
MCV: 88.5 fL (ref 78.0–100.0)
PLATELETS: 77 10*3/uL — AB (ref 150–400)
PLATELETS: 62 10*3/uL — AB (ref 150–400)
PLATELETS: 45 10*3/uL — AB (ref 150–400)
RBC: 3 MIL/uL — AB (ref 4.22–5.81)
RBC: 2.85 MIL/uL — ABNORMAL LOW (ref 4.22–5.81)
RBC: 3.14 MIL/uL — ABNORMAL LOW (ref 4.22–5.81)
RDW: 16.7 % — AB (ref 11.5–15.5)
RDW: 16.4 % — AB (ref 11.5–15.5)
RDW: 16.3 % — AB (ref 11.5–15.5)
WBC: 6.4 10*3/uL (ref 4.0–10.5)
WBC: 5.7 10*3/uL (ref 4.0–10.5)
WBC: 6.1 10*3/uL (ref 4.0–10.5)

## 2015-07-14 LAB — BLOOD GAS, ARTERIAL
Acid-base deficit: 13.4 mmol/L — ABNORMAL HIGH (ref 0.0–2.0)
Bicarbonate: 11.8 mEq/L — ABNORMAL LOW (ref 20.0–24.0)
DRAWN BY: 441371
FIO2: 0.4
O2 Saturation: 97.6 %
PATIENT TEMPERATURE: 98.2
PCO2 ART: 24.3 mmHg — AB (ref 35.0–45.0)
PEEP: 5 cmH2O
PO2 ART: 114 mmHg — AB (ref 80.0–100.0)
Pressure control: 30 cmH2O
RATE: 25 resp/min
TCO2: 12.5 mmol/L (ref 0–100)
pH, Arterial: 7.306 — ABNORMAL LOW (ref 7.350–7.450)

## 2015-07-14 LAB — TROPONIN I
TROPONIN I: 4.51 ng/mL — AB (ref <0.031)
Troponin I: 4.86 ng/mL (ref <0.031)

## 2015-07-14 LAB — POCT I-STAT 3, ART BLOOD GAS (G3+)
ACID-BASE DEFICIT: 14 mmol/L — AB (ref 0.0–2.0)
BICARBONATE: 11.7 meq/L — AB (ref 20.0–24.0)
O2 Saturation: 97 %
PCO2 ART: 25.9 mmHg — AB (ref 35.0–45.0)
Patient temperature: 98.6
TCO2: 13 mmol/L (ref 0–100)
pH, Arterial: 7.264 — ABNORMAL LOW (ref 7.350–7.450)
pO2, Arterial: 102 mmHg — ABNORMAL HIGH (ref 80.0–100.0)

## 2015-07-14 LAB — RENAL FUNCTION PANEL
ALBUMIN: 1.2 g/dL — AB (ref 3.5–5.0)
ALBUMIN: 1.2 g/dL — AB (ref 3.5–5.0)
Anion gap: 14 (ref 5–15)
Anion gap: 14 (ref 5–15)
BUN: 99 mg/dL — AB (ref 6–20)
BUN: 97 mg/dL — ABNORMAL HIGH (ref 6–20)
CALCIUM: 7.3 mg/dL — AB (ref 8.9–10.3)
CALCIUM: 7.1 mg/dL — AB (ref 8.9–10.3)
CHLORIDE: 92 mmol/L — AB (ref 101–111)
CO2: 14 mmol/L — ABNORMAL LOW (ref 22–32)
CO2: 17 mmol/L — ABNORMAL LOW (ref 22–32)
CREATININE: 5.47 mg/dL — AB (ref 0.61–1.24)
CREATININE: 5.43 mg/dL — AB (ref 0.61–1.24)
Chloride: 95 mmol/L — ABNORMAL LOW (ref 101–111)
GFR calc Af Amer: 11 mL/min — ABNORMAL LOW (ref >60)
GFR, EST AFRICAN AMERICAN: 11 mL/min — AB (ref >60)
GFR, EST NON AFRICAN AMERICAN: 10 mL/min — AB (ref >60)
GFR, EST NON AFRICAN AMERICAN: 10 mL/min — AB (ref >60)
GLUCOSE: 294 mg/dL — AB (ref 65–99)
Glucose, Bld: 292 mg/dL — ABNORMAL HIGH (ref 65–99)
PHOSPHORUS: 9.7 mg/dL — AB (ref 2.5–4.6)
PHOSPHORUS: 9.2 mg/dL — AB (ref 2.5–4.6)
POTASSIUM: 5.6 mmol/L — AB (ref 3.5–5.1)
Potassium: 5.5 mmol/L — ABNORMAL HIGH (ref 3.5–5.1)
SODIUM: 123 mmol/L — AB (ref 135–145)
SODIUM: 123 mmol/L — AB (ref 135–145)

## 2015-07-14 LAB — MAGNESIUM: Magnesium: 1.9 mg/dL (ref 1.7–2.4)

## 2015-07-14 LAB — POCT ACTIVATED CLOTTING TIME
ACTIVATED CLOTTING TIME: 175 s
ACTIVATED CLOTTING TIME: 186 s
ACTIVATED CLOTTING TIME: 191 s

## 2015-07-14 LAB — PREALBUMIN: PREALBUMIN: 2.7 mg/dL — AB (ref 18–38)

## 2015-07-14 LAB — BASIC METABOLIC PANEL
Anion gap: 14 (ref 5–15)
BUN: 101 mg/dL — AB (ref 6–20)
CALCIUM: 7.6 mg/dL — AB (ref 8.9–10.3)
CO2: 16 mmol/L — ABNORMAL LOW (ref 22–32)
CREATININE: 5.79 mg/dL — AB (ref 0.61–1.24)
Chloride: 92 mmol/L — ABNORMAL LOW (ref 101–111)
GFR calc non Af Amer: 9 mL/min — ABNORMAL LOW (ref >60)
GFR, EST AFRICAN AMERICAN: 10 mL/min — AB (ref >60)
Glucose, Bld: 349 mg/dL — ABNORMAL HIGH (ref 65–99)
Potassium: 6.1 mmol/L (ref 3.5–5.1)
SODIUM: 122 mmol/L — AB (ref 135–145)

## 2015-07-14 LAB — PHOSPHORUS: Phosphorus: 10.1 mg/dL — ABNORMAL HIGH (ref 2.5–4.6)

## 2015-07-14 LAB — DIFFERENTIAL
Basophils Absolute: 0.1 10*3/uL (ref 0.0–0.1)
Basophils Relative: 1 %
EOS PCT: 1 %
Eosinophils Absolute: 0.1 10*3/uL (ref 0.0–0.7)
LYMPHS ABS: 0.8 10*3/uL (ref 0.7–4.0)
LYMPHS PCT: 12 %
MONOS PCT: 8 %
Monocytes Absolute: 0.5 10*3/uL (ref 0.1–1.0)
Neutro Abs: 4.9 10*3/uL (ref 1.7–7.7)
Neutrophils Relative %: 78 %
WBC MORPHOLOGY: INCREASED

## 2015-07-14 LAB — GLUCOSE, CAPILLARY
GLUCOSE-CAPILLARY: 205 mg/dL — AB (ref 65–99)
GLUCOSE-CAPILLARY: 264 mg/dL — AB (ref 65–99)
GLUCOSE-CAPILLARY: 310 mg/dL — AB (ref 65–99)
GLUCOSE-CAPILLARY: 249 mg/dL — AB (ref 65–99)
GLUCOSE-CAPILLARY: 201 mg/dL — AB (ref 65–99)
Glucose-Capillary: 284 mg/dL — ABNORMAL HIGH (ref 65–99)
Glucose-Capillary: 263 mg/dL — ABNORMAL HIGH (ref 65–99)

## 2015-07-14 LAB — HEPARIN LEVEL (UNFRACTIONATED)
HEPARIN UNFRACTIONATED: 0.49 [IU]/mL (ref 0.30–0.70)
Heparin Unfractionated: 0.53 IU/mL (ref 0.30–0.70)

## 2015-07-14 LAB — TRIGLYCERIDES: Triglycerides: 133 mg/dL (ref <150)

## 2015-07-14 LAB — VANCOMYCIN, RANDOM: Vancomycin Rm: 27 ug/mL

## 2015-07-14 MED ORDER — PRISMASOL BGK 0/2.5 32-2.5 MEQ/L IV SOLN
INTRAVENOUS | Status: DC
  Administered 2015-07-14 – 2015-07-15 (×2): via INTRAVENOUS_CENTRAL
  Filled 2015-07-14 (×3): qty 5000

## 2015-07-14 MED ORDER — CALCIUM CHLORIDE 10 % IV SOLN
1.0000 g | Freq: Once | INTRAVENOUS | Status: AC
  Administered 2015-07-14: 1 g via INTRAVENOUS

## 2015-07-14 MED ORDER — PRISMASOL BGK 0/2.5 32-2.5 MEQ/L IV SOLN
INTRAVENOUS | Status: DC
  Administered 2015-07-14 – 2015-07-15 (×7): via INTRAVENOUS_CENTRAL
  Filled 2015-07-14 (×14): qty 5000

## 2015-07-14 MED ORDER — STERILE WATER FOR INJECTION IV SOLN
INTRAVENOUS | Status: DC
  Administered 2015-07-14 – 2015-07-15 (×7): via INTRAVENOUS_CENTRAL
  Filled 2015-07-14 (×16): qty 150

## 2015-07-14 MED ORDER — DEXTROSE 50 % IV SOLN
1.0000 | Freq: Once | INTRAVENOUS | Status: AC
Start: 2015-07-14 — End: 2015-07-14
  Administered 2015-07-14: 50 mL via INTRAVENOUS

## 2015-07-14 MED ORDER — INSULIN ASPART 100 UNIT/ML IV SOLN
5.0000 [IU] | Freq: Once | INTRAVENOUS | Status: AC
  Administered 2015-07-14: 5 [IU] via INTRAVENOUS

## 2015-07-14 MED ORDER — ARGATROBAN 50 MG/50ML IV SOLN
0.5000 ug/kg/min | INTRAVENOUS | Status: DC
  Administered 2015-07-14: 0.5 ug/kg/min via INTRAVENOUS
  Filled 2015-07-14: qty 50

## 2015-07-14 MED ORDER — TRACE MINERALS CR-CU-MN-SE-ZN 10-1000-500-60 MCG/ML IV SOLN
INTRAVENOUS | Status: AC
  Administered 2015-07-14: 18:00:00 via INTRAVENOUS
  Filled 2015-07-14: qty 1200

## 2015-07-14 MED ORDER — SODIUM CHLORIDE 0.9 % FOR CRRT
INTRAVENOUS_CENTRAL | Status: DC | PRN
  Administered 2015-07-14: 14:00:00 via INTRAVENOUS_CENTRAL
  Filled 2015-07-14 (×2): qty 1000

## 2015-07-14 MED ORDER — HEPARIN SODIUM (PORCINE) 1000 UNIT/ML DIALYSIS
1000.0000 [IU] | INTRAMUSCULAR | Status: DC | PRN
  Administered 2015-07-14: 2800 [IU] via INTRAVENOUS_CENTRAL
  Filled 2015-07-14 (×2): qty 6

## 2015-07-14 MED ORDER — DEXTROSE 50 % IV SOLN
INTRAVENOUS | Status: AC
  Filled 2015-07-14: qty 50

## 2015-07-14 MED ORDER — ASPIRIN 300 MG RE SUPP
300.0000 mg | Freq: Once | RECTAL | Status: AC
  Administered 2015-07-14: 300 mg via RECTAL
  Filled 2015-07-14: qty 1

## 2015-07-14 MED ORDER — CALCIUM CHLORIDE 10 % IV SOLN
INTRAVENOUS | Status: AC
  Filled 2015-07-14: qty 10

## 2015-07-14 MED ORDER — HEPARIN SODIUM (PORCINE) 1000 UNIT/ML DIALYSIS: 1000.0000 [IU] | INTRAMUSCULAR | Status: DC | PRN

## 2015-07-14 MED ORDER — SODIUM BICARBONATE 8.4 % IV SOLN
100.0000 meq | Freq: Once | INTRAVENOUS | Status: AC
  Administered 2015-07-14: 100 meq via INTRAVENOUS

## 2015-07-14 NOTE — Procedures (Signed)
Hemodialysis Catheter Insertion Procedure Note [name] [id number] 02/05/1945  Procedure: Insertion of Hemodialysis Catheter Indications: CRRT  Procedure Details Consent: Risks of procedure as well as the alternatives and risks of each were explained to the (patient/caregiver).  Consent for procedure obtained. Time Out: Verified patient identification, verified procedure, site/side was marked, verified correct patient position, special equipment/implants available, medications/allergies/relevent history reviewed, required imaging and test results available.  Performed  Maximum sterile technique was used including antiseptics, cap, gloves, gown, hand hygiene, mask and sheet. Skin prep: Chlorhexidine; local anesthetic administered A antimicrobial bonded/coated triple lumen catheter was placed in the left internal jugular vein using the Seldinger technique.  Evaluation Blood flow good Complications: No apparent complications Patient did tolerate procedure well. Chest X-ray ordered to verify placement.  CXR: pending.  Procedure performed under direct ultrasound guidance for real time vessel cannulation.      [name], [place]PA - [facility] [place] [facility] Pgr: [phone]  or [phone] 07/14/2015, 12:42 PM

## 2015-07-14 NOTE — Consult Note (Signed)
WOC wound consult note Reason for Consult: bilateral heels Wound type: ? Deep tissue injury vs. Perfusion issues.  Patient on vasopressors and sedated on the vent. He is slightly mottled from the knees down and he has cyanosis of the right great toe.  The heels are slightly purple but not boggy. The right heel has a darker purple area centrally.   Pressure Ulcer POA: No Measurement:right heel 2.5cm x 1.0cm x 0 Left heel: 3cm x 3cm x 0 Wound bed: no open wounds, intact skin with discoloration Drainage (amount, consistency, odor) none Periwound:intact, cool LE, doppler pulses  Dressing procedure/placement/frequency: Add Prevalon boots for offloading the heels at all time.  Monitor heels q shift and PRN.  Foam dressings in place for protection.    WOC team will follow along with you for weekly wound assessments.  Please notify me of any acute changes in the wounds or any new areas of concerns Armen PickupMelody Wyland Rastetter RN,CWOCN [phone]

## 2015-07-14 NOTE — Progress Notes (Signed)
Assessment/ Plan: Pt is a 70 y.o. yo male who was admitted on 06/30/2015 with AAA repair with resultant A on CRF  Assessment/Plan: 1. AAA- POD #12 repair- per vasc  2. Renal- baseline crt 1.3- A on CRF after procedure-?atheroembolic dz, worsening hyperkalemia and persistent acidosis; Will begin CVVHD   3. Anemia- hgb stable 4. HTN/volume- BP marginal- req sedation could be affecting- dont think is volume issue- stopped metoprolol- appeared to respond to lasix challenge yest- will add back scheduled today  5. Hypoatremia- persistent 6. Hyperkalemia, worse 7. Intubation and pressor requirement with rising WBC- abx started- WBC down  8. Metabolic acidosis due to renal failure- adding some bicarb  9 Thrombocytopenia, will avoid anticoag with CRRT  Subjective: Interval History: labs worse.  Objective: Vital signs in last 24 hours: Temp:  [98.1 F (36.7 C)-101 F (38.3 C)] 98.2 F (36.8 C) (06/19 0900) Pulse Rate:  [56-108] 96 (06/19 0900) Resp:  [19-30] 24 (06/19 0900) BP: (72-136)/(27-92) 102/68 mmHg (06/19 0900) SpO2:  [89 %-100 %] 100 % (06/19 0900) FiO2 (%):  [40 %] 40 % (06/19 0800) Weight:  [102.7 kg (226 lb 6.6 oz)] 102.7 kg (226 lb 6.6 oz) (06/19 0500) Weight change: 5.7 kg (12 lb 9.1 oz)  Intake/Output from previous day: 06/18 0701 - 06/19 0700 In: 7056 [I.V.:5558.6; NG/GT:180; IV Piggyback:282; TPN:980.4] Out: 1600 [Urine:900; Emesis/NG output:700] Intake/Output this shift: Total I/O In: 467.3 [I.V.:337.3; NG/GT:30; TPN:100] Out: 30 [Urine:30]  General appearance: alert and cooperative Chest wall: no tenderness Cardio: regular rate and rhythm, S1, S2 normal, no murmur, click, rub or gallop Unresponsive and intubated eccymotic toes bilat, cold right great toe  Lab Results:  Recent Labs  07/13/15 0314 07/14/15 0500  WBC 6.6 6.4  HGB 9.6* 8.9*  HCT 31.0* 27.2*  PLT 147* 77*   BMET:  Recent Labs  07/14/15 0330 07/14/15 0500  NA 123* 122*  K 5.6*  5.9*  CL 95* 95*  CO2 14* 15*  GLUCOSE 294* 281*  BUN 99* 100*  CREATININE 5.47* 5.56*  CALCIUM 7.3* 7.1*   No results for input(s): PTH in the last 72 hours. Iron Studies: No results for input(s): IRON, TIBC, TRANSFERRIN, FERRITIN in the last 72 hours. Studies/Results: Dg Chest Port 1 View  07/14/2015  CLINICAL DATA:  Intubated patient, status post abdominal aortic aneurysm repair on July 01, 2015, acute respiratory failure and acute renal failure. EXAM: PORTABLE CHEST 1 VIEW COMPARISON:  Portable chest x-ray of July 13, 2015 FINDINGS: The lungs are adequately inflated. The interstitial markings are coarse at both lung bases but are stable. There is a trace of pleural fluid on the left which is also stable. There is no pneumothorax. The cardiac silhouette is top-normal in size but stable. The pulmonary vascularity is normal. The endotracheal tube tip lies 4.6 cm above the carina. The esophagogastric tube tip projects below the inferior margin of the image. The right internal jugular venous catheter tip projects over the proximal SVC. IMPRESSION: Persistent mild bibasilar atelectasis with trace left pleural effusion. The support tubes are in stable position. Electronically Signed   By: David  Martinique M.D.   On: 07/14/2015 07:22   Dg Chest Port 1 View  07/13/2015  CLINICAL DATA:  Patient status post ET tube placement. EXAM: PORTABLE CHEST 1 VIEW COMPARISON:  Chest radiograph 07/11/2015. FINDINGS: Right IJ central venous catheter tip projects over the superior vena cava. Enteric tube courses inferior to the diaphragm. ET tube terminates in the mid trachea. Stable enlarged cardiac and  mediastinal contours. Low lung volumes. Bibasilar airspace opacities. No pleural effusion or pneumothorax. IMPRESSION: Interval advancement enteric tube with tip coursing inferior to the diaphragm. Basilar opacities favored to represent atelectasis. Electronically Signed   By: Lovey Newcomer M.D.   On: 07/13/2015 08:17    Scheduled: . antiseptic oral rinse  7 mL Mouth Rinse 10 times per day  . budesonide (PULMICORT) nebulizer solution  0.5 mg Nebulization BID  . chlorhexidine gluconate (SAGE KIT)  15 mL Mouth Rinse BID  . dextrose  1 ampule Intravenous Once  . docusate  100 mg Per Tube Daily  . feeding supplement (PRO-STAT SUGAR FREE 64)  60 mL Per Tube BID  . furosemide  160 mg Intravenous BID  . insulin aspart  0-20 Units Subcutaneous Q4H  . ipratropium-albuterol  3 mL Nebulization Q4H  . levofloxacin (LEVAQUIN) IV  500 mg Intravenous Q48H  . OLANZapine zydis  5 mg Oral Daily     LOS: 13 days   Jimesha Rising C 07/14/2015,10:31 AM

## 2015-07-14 NOTE — Progress Notes (Signed)
[facility] PCCM   Nurse was alerted as pt was seen to be asystole on cardiac monitor.  He was on neosynephrine drip during this time. He was on PST on the vent.  He checked for pulse and did not feel any.  By the time he was to give meds, pt's HR started coming up > from 30s to 50s to 80s.  Asystole/Sinus Bradycardia likely 2/2 Hyperkalemia.   Currently, sedated. 90/60, HR 104 SR, rate 20, 100% Fio2 on 40%. Sedated.  Good s1/s2. (-) m/r/g. Fair ae. Bibasilar crackles.  Cyanotic extremities.   On neosynpehrine drip still. Not higher dose. On full vent support.  PCCM ordered Ca chloride, 2 amps NaHCO3, D50 1 amp,.  Contacted daughter. She said to discuss plans with ex wife.  D/w ex wife and she is in agreement with HD cath. She knows the risk of bleeding is high but agrees to having it done. Ex wife ok with trache as well.  Will emergently place HD cath to start CVVH.  Check bmp now.  Check EKG Heparin drip has been turned off at 10:40am.    J. Alexis FrockAngelo A de Dios, MD 07/14/2015, 12:12 PM [facility] Pager [phone] After 3 pm or if no answer, call [phone]

## 2015-07-14 NOTE — Plan of Care (Signed)
Problem: Activity: Goal: Mobility will improve Outcome: Not Progressing Pt not responsive enough to participate in mobility-improving activities. Passive ROM performed q2hr.

## 2015-07-14 NOTE — Progress Notes (Signed)
PT Cancellation Note  Patient Details Name: Wyatt CarrelRalph C Miller MRN: [id number] DOB: 11/09/1945   Cancelled Treatment:    Reason Eval/Treat Not Completed: Medical issues which prohibited therapy (pt back on vent)   [name] 07/14/2015, 9:11 AM [facility] Diosdado PT [phone]

## 2015-07-14 NOTE — Progress Notes (Signed)
ANTICOAGULATION CONSULT NOTE - Follow up Consult  Pharmacy Consult for argatroban Indication: hx CAD w/ recent stents, r/o HIT  Allergies  Allergen Reactions  . Amoxicillin Swelling    Only happened with 500 mg dose, can handle 250 mg  . Penicillins Swelling    Face swelling overnight within the last 5 years Has patient had a PCN reaction causing immediate rash, facial/tongue/throat swelling, SOB or lightheadedness with hypotension: No Has patient had a PCN reaction causing severe rash involving mucus membranes or skin necrosis: No Has patient had a PCN reaction that required hospitalization No Has patient had a PCN reaction occurring within the last 10 years: Yes If all of the above answers are "NO", then may proceed with Cephalosporin use.   . Exelon [Rivastigmine Tartrate] Rash    Patch    Patient Measurements: Height: 5\' 11"  (180.3 cm) Weight: 226 lb 6.6 oz (102.7 kg) IBW/kg (Calculated) : 75.3  Vital Signs: Temp: 97.1 F (36.2 C) (06/19 1910) Temp Source: Axillary (06/19 1910) BP: 79/63 mmHg (06/19 2100) Pulse Rate: 30 (06/19 2000)  Labs:  Recent Labs  07/13/15 2000 07/14/15 0330 07/14/15 0500 07/14/15 1300 07/14/15 1632 07/14/15 1822 07/14/15 2023  HGB  --   --  8.9* 8.4*  --   --  9.3*  HCT  --   --  27.2* 25.6*  --   --  27.8*  PLT  --   --  77* 62*  --   --  45*  HEPARINUNFRC 0.19* 0.53  --   --   --   --  0.49  CREATININE  --  5.47* 5.56* 5.79* 5.43*  --   --   TROPONINI  --   --   --  4.51*  --  4.86*  --     Estimated Creatinine Clearance: 15.5 mL/min (by C-G formula based on Cr of 5.43).  Assessment: 69yom s/p 2 overlapping stents to his circumflex (02/11/15) underwent AAA repair on 06/28/2015. Dual anti-platelet therapy with aspirin and plavix was interrupted for the procedure and patient now with significant ileus post-op and remains NPO. He was started on heparin to avoid the risk of stent thrombosis while aspirin and plavix on hold.   Pt's platelet  count has been dropping in the past 48 hrs, now down to 45K, per discussion with CCM this afternoon, will change heparin to argatroban and check heparin antibody. On call CCM MD agreed with the plan. LFTs and T-bili are mildly elevated. Likely d/t cardiogenic shock?  Goal of Therapy:  aPTT = 50-90  Monitor platelets by anticoagulation protocol: Yes   Plan: D/c IV heparin  Start Argatroban 0.85mcg/kg/min 2 hr aPTT at midnight  Bayard HuggerMei Zury Fazzino, PharmD, BCPS  Clinical Pharmacist  Pager: [phone]  07/14/2015 9:19 PM

## 2015-07-14 NOTE — Progress Notes (Addendum)
PULMONARY / CRITICAL CARE MEDICINE   Name: [name] MRN: [id number] DOB: Dec 09, 1945    ADMISSION DATE:  07/20/15 CONSULTATION DATE:  Jul 20, 2015  REFERRING MD:  Dr. Darrick Penna  CHIEF COMPLAINT:  Vent Management   HISTORY OF PRESENT ILLNESS:  70 y.o. Male with a past medical history of Hypertension, CAD, AAA, High Cholesterol, Depression, Anxiety, Essential Tumor, Dementia, Asthma, COPD, former smoker, Anemia, MI, Pneumonia, and Stroke.  He was seen in February with CTA after his duplex revealed an increase in size from 4.8 cm to 5.2 cm.  The CTA revealed a 5.7 AAA.  On 5/11 he was told he was not a candidate for stent graft repair however he was a candidate for open repair.  He was taken to the OR on 6/6 for open AAA repair.  PCCM consulted on 6/6 for vent management  SUBJECTIVE:  No issues (resp) over the weekend.  Doing PST now. Continues to have fever spikes.  BUN/Creat rising, Na is low. Sedation turned off this am.   VITAL SIGNS: BP 101/72 mmHg  Pulse 96  Temp(Src) 98.1 F (36.7 C) (Axillary)  Resp 19  Ht  (1.803 m)  Wt 102.7 kg (226 lb 6.6 oz)  BMI 31.59 kg/m2  SpO2 100%  HEMODYNAMICS:    VENTILATOR SETTINGS: Vent Mode:  [-] CPAP;PSV FiO2 (%):  [40 %] 40 % Set Rate:  [25 bmp] 25 bmp PEEP:  [5 cmH20] 5 cmH20 Pressure Support:  [5 cmH20] 5 cmH20 Plateau Pressure:  [19 cmH20-27 cmH20] 22 cmH20  INTAKE / OUTPUT:  Intake/Output Summary (Last 24 hours) at 07/14/15 0806 Last data filed at 07/14/15 0700  Gross per 24 hour  Intake 6593.35 ml  Output   1450 ml  Net 5143.35 ml   PHYSICAL EXAMINATION: General:  Chronically ill appearing male, NAD on vent.  Off sedation and not following commads.  Neuro: sedated on vent, RASS -2 HEENT:  Mm dry, no JVD, NG tube  Cardiovascular:  S1s2 rrr  Lungs:  resps even  Non labored on vent, few scattered rhonchi. Crackles at bases Abdomen:  Minimal bowel sounds, midline abdominal incision dressing dry and intact. Abd  slightly distended, multiple bruises  Musculoskeletal:  Normal bulk, Gr 1  edema .cyanotic toes.   LABS:  BMET  Recent Labs Lab 07/13/15 0314 07/14/15 0330 07/14/15 0500  NA 128* 123* 122*  K 4.6 5.6* 5.9*  CL 104 95* 95*  CO2 14* 14* 15*  BUN 85* 99* 100*  CREATININE 4.21* 5.47* 5.56*  GLUCOSE 305* 294* 281*   Electrolytes  Recent Labs Lab 07/12/15 0330  07/13/15 0314 07/14/15 0330 07/14/15 0500  CALCIUM 7.5*  < > 7.3* 7.3* 7.1*  MG 2.3  --  2.0  --  1.9  PHOS 5.5*  --  6.7* 9.7* 10.1*  < > = values in this interval not displayed.  CBC  Recent Labs Lab 07/12/15 0330 07/13/15 0314 07/14/15 0500  WBC 18.8* 6.6 6.4  HGB 10.3* 9.6* 8.9*  HCT 34.2* 31.0* 27.2*  PLT 226 147* 77*   Coag's No results for input(s): APTT, INR in the last 168 hours. Sepsis Markers  Recent Labs Lab 07/11/15 0414 07/12/15 0330 07/13/15 0314  PROCALCITON 0.56 1.87 3.01   ABG  Recent Labs Lab 07/12/15 0424 07/13/15 0323 07/14/15 0328  PHART 7.280* 7.271* 7.264*  PCO2ART 33.7* 28.4* 25.9*  PO2ART 154* 99.0 102.0*   Liver Enzymes  Recent Labs Lab 07/09/15 0411 07/10/15 0525  07/13/15 0314 07/14/15 0330 07/14/15  0500  AST 109* 78*  --   --   --  115*  ALT 135* 108*  --   --   --  77*  ALKPHOS 60 47  --   --   --  64  BILITOT 2.1* 2.2*  --   --   --  2.1*  ALBUMIN 2.2* 1.7*  < > 1.4* 1.2* 1.2*  < > = values in this interval not displayed.  Cardiac Enzymes No results for input(s): TROPONINI, PROBNP in the last 168 hours.  Glucose  Recent Labs Lab 07/13/15 0730 07/13/15 1344 07/13/15 1540 07/13/15 1925 07/13/15 2318 07/14/15 0329  GLUCAP 313* 255* 242* 235* 264* 310*   Imaging Dg Chest Port 1 View  07/14/2015  CLINICAL DATA:  Intubated patient, status post abdominal aortic aneurysm repair on July 01, 2015, acute respiratory failure and acute renal failure. EXAM: PORTABLE CHEST 1 VIEW COMPARISON:  Portable chest x-ray of July 13, 2015 FINDINGS: The lungs  are adequately inflated. The interstitial markings are coarse at both lung bases but are stable. There is a trace of pleural fluid on the left which is also stable. There is no pneumothorax. The cardiac silhouette is top-normal in size but stable. The pulmonary vascularity is normal. The endotracheal tube tip lies 4.6 cm above the carina. The esophagogastric tube tip projects below the inferior margin of the image. The right internal jugular venous catheter tip projects over the proximal SVC. IMPRESSION: Persistent mild bibasilar atelectasis with trace left pleural effusion. The support tubes are in stable position. Electronically Signed   By: David  [place] M.D.   On: 07/14/2015 07:22   STUDIES:  Renal u/s 6/7>>> normal   CULTURES: Blood 6/14 > (-) Sputum 6/14 > multiple org MRSA screen 6/16 >  (-)  ANTIBIOTICS: Vancomycin 6/6>>  Levaquin 6/14>>> 6/18 Levaquin 6/18 >>  SIGNIFICANT EVENTS: 6/6-Pt hd open AAA repair remains intubated post surgery. Course c/b coagulopathy (likely dilutional), acute renal injury, shock.   LINES/TUBES: RIJ dual lumen CVC 6/6>> L radial aline 6/6>>6/10 PIV x1>> ETT 6/6>>6/10>>>6/16  DISCUSSION: On 5/11 he was told he was not a candidate for stent graft repair however he was a candidate for open repair.  He was taken to the OR on 6/6 for open AAA repair.  PCCM consulted on 6/6 for vent management, shock, AKI.  Creat rising. Continue lasix per renal.  ASSESSMENT / PLAN:  PULMONARY A: Acute hypoxic respiratory failure- extubated 6/10 then re-intubated 6/15 2/2  Pulm edema, possible bibasilar HCAP, COPD Hx: COPD, former smoker  P:   Continue full vent support, needs volume off prior to consideration of extubation. Sedation off > attempt wean once awake but doubt he will do good. Likely will need a trache.  Cont Duoneb, pulmicort. Continue abx.   CARDIOVASCULAR A:  Hypotension, AAA open repair, Sinus Bradycardia Hx: MI, Stroke, Heart Murmur, Anginal  Pain, CAD, HTN AFib with RVR -- 6/9 - quickly converted back to NSR.  Hypotension  P:  Vascular surgery following. Telemetry monitoring. Continue  neo gtt and titrate off as able  On heparin drip. On amio drip   RENAL A:   Acute Renal failure, ATN  CKD st 3A Hyponatremia  P:   BUN/Creat continue to rise.  He is on lasix BID and makes urine.  He is not waking up likely from sedation and AKI/Azotemia. K is rising and Na is still low. Renal service is following.  I anticipate he may need CRRT.  Lasix per renal  BMET in AM. Replace electrolytes as indicated.  GASTROINTESTINAL A:   Severe malnutrition Hx: Pyloric Spasms Nausea/vomiting  P:   NPO for now per VVS. Continue TPN. NG to suction. PPI for PUP. Stool occult blood negative.  HEMATOLOGIC A:   Anemia  Hematuria Epistaxis - profuse nosebleed (nose packed per Vascular Surgery)-->packing now out  P:  Monitor s/sx for bleeding F/u CBC  Heparin per pharmacy  INFECTIOUS A:   CXR with atelectasis +/- bibasilar infiltrate.  Elevated PCT at 3 P:   Trend WBC's, fever curve  Levofloxacin and Vanc as ordered.  VAP prevention  Will monitor PCT.   ENDOCRINE Type II DM P:   CBG monitoring q4hrs SSI Hyper/Hypoglycemic protocol as indicated   NEUROLOGIC A:   Pain Hx: Depression, Anxiety, Essential tremor, Dementia P:   RASS goal: -1 Continue fentanyl and versed drips >> turned off this am to let pt wake up. D/Ced propofol Continue SL zyprexa for mood stabilization.  FAMILY  - Updates: no family available 6/19.  Spoke and discussed with daughter Feliberto HartsDana Losano over all condition and prognosis.  She likely will say yes to trache but need to talk to her mother. She was very emotional.   Critical Care time on this patient today : 33 minutes.   Pollie MeyerJ. Angelo A de Dios, MD 07/14/2015, 8:32 AM [facility] Pager [phone] After 3 pm or if no answer, call [phone]

## 2015-07-14 NOTE — Care Management Important Message (Signed)
Important Message  Patient Details  Name: Jaci CarrelRalph C Castillo MRN: [id number] Date of Birth: 11/23/1945   Medicare Important Message Given:  Yes    Kristine Tiley Abena 07/14/2015, 11:08 AM

## 2015-07-14 NOTE — Progress Notes (Addendum)
Pharmacy Antibiotic Note  Wyatt CarrelRalph C Miller is a 70 y.o. male admitted on 07/14/2015 with pneumonia.  Pharmacy has been consulted for vancomycin dosing.  Pt is febrile to 101 this am, WBC has trended down to wnl, and sCr continues to trend up to 5.5. Perm cath placed this am to start CRRT now.   Vancomycin level this afternoon prior to starting crrt was still elevated at 27. Will continue to hold vancomycin for now.  Plan: Hold vancomycin for now Recheck another random vancomycin level tomorrow  Reduce levaquin to 500mg  IV q48h  Height: 5\' 11"  (180.3 cm) Weight: 226 lb 6.6 oz (102.7 kg) IBW/kg (Calculated) : 75.3  Temp (24hrs), Avg:99.2 F (37.3 C), Min:98.1 F (36.7 C), Max:100.5 F (38.1 C)   Recent Labs Lab 07/11/15 0414 07/12/15 0330 07/12/15 1555 07/13/15 0314 07/13/15 1305 07/14/15 0330 07/14/15 0500 07/14/15 1300  WBC 15.6* 18.8*  --  6.6  --   --  6.4 PENDING  CREATININE 2.32* 3.21* 3.62* 4.21*  --  5.47* 5.56*  --   VANCOTROUGH  --   --   --   --  8033*  --   --   --   VANCORANDOM  --   --   --   --   --   --   --  27    Estimated Creatinine Clearance: 15.1 mL/min (by C-G formula based on Cr of 5.56).    Allergies  Allergen Reactions  . Amoxicillin Swelling    Only happened with 500 mg dose, can handle 250 mg  . Penicillins Swelling    Face swelling overnight within the last 5 years Has patient had a PCN reaction causing immediate rash, facial/tongue/throat swelling, SOB or lightheadedness with hypotension: No Has patient had a PCN reaction causing severe rash involving mucus membranes or skin necrosis: No Has patient had a PCN reaction that required hospitalization No Has patient had a PCN reaction occurring within the last 10 years: Yes If all of the above answers are "NO", then may proceed with Cephalosporin use.   . Exelon [Rivastigmine Tartrate] Rash    Patch    Antimicrobials this admission: Vanc 6/16>> Levaquin 6/16>>  Dose adjustments this  admission: 6/18 VT: 33 on vancomycin 1250mg  q24h, hold and recheck VR tomorrow 6/19 VR: 27: starting CRRT today  Microbiology results: 6/14 TA - few gram variable rod  6/15 TA - GN coccobacilli  6/15 Urine - NEG  6/15 Blood - NGTD  Sheppard CoilFrank Wilson PharmD., BCPS Clinical Pharmacist Pager [phone] 07/14/2015 2:08 PM

## 2015-07-14 NOTE — Progress Notes (Signed)
[place] NOTE  Pharmacy Consult for TPN Indication: prolonged ileus  Allergies  Allergen Reactions  . Amoxicillin Swelling    Only happened with 500 mg dose, can handle 250 mg  . Penicillins Swelling    Face swelling overnight within the last 5 years Has patient had a PCN reaction causing immediate rash, facial/tongue/throat swelling, SOB or lightheadedness with hypotension: No Has patient had a PCN reaction causing severe rash involving mucus membranes or skin necrosis: No Has patient had a PCN reaction that required hospitalization No Has patient had a PCN reaction occurring within the last 10 years: Yes If all of the above answers are "NO", then may proceed with Cephalosporin use.   . Exelon [Rivastigmine Tartrate] Rash    Patch   Patient Measurements: Height: '5\' 11"'  (180.3 cm) Weight: 226 lb 6.6 oz (102.7 kg) IBW/kg (Calculated) : 75.3  Vital Signs: Temp: 98.1 F (36.7 C) (06/19 0333) Temp Source: Axillary (06/19 0333) BP: 101/72 mmHg (06/19 0735) Pulse Rate: 96 (06/19 0735) Intake/Output from previous day: 06/18 0701 - 06/19 0700 In: 6822.9 [I.V.:5375.4; NG/GT:180; IV Piggyback:282; TPN:930.4] Out: 1600 [Urine:900; Emesis/NG output:700]  Labs:  Recent Labs  07/12/15 0330 07/13/15 0314 07/14/15 0500  WBC 18.8* 6.6 6.4  HGB 10.3* 9.6* 8.9*  HCT 34.2* 31.0* 27.2*  PLT 226 147* 77*    Recent Labs  07/12/15 0330  07/13/15 0314 07/13/15 0315 07/14/15 0330 07/14/15 0500  NA 132*  < > 128*  --  123* 122*  K 5.3*  < > 4.6  --  5.6* 5.9*  CL 106  < > 104  --  95* 95*  CO2 18*  < > 14*  --  14* 15*  GLUCOSE 259*  < > 305*  --  294* 281*  BUN 68*  < > 85*  --  99* 100*  CREATININE 3.21*  < > 4.21*  --  5.47* 5.56*  CALCIUM 7.5*  < > 7.3*  --  7.3* 7.1*  MG 2.3  --  2.0  --   --  1.9  PHOS 5.5*  --  6.7*  --  9.7* 10.1*  PROT  --   --   --   --   --  5.0*  ALBUMIN 1.6*  --  1.4*  --  1.2* 1.2*  AST  --   --   --   --   --  115*  ALT  --    --   --   --   --  77*  ALKPHOS  --   --   --   --   --  64  BILITOT  --   --   --   --   --  2.1*  PREALBUMIN  --   --   --   --   --  2.7*  TRIG  --   --   --  134  --  133  < > = values in this interval not displayed. Estimated Creatinine Clearance: 15.1 mL/min (by C-G formula based on Cr of 5.56).   Recent Labs  07/13/15 1925 07/13/15 2318 07/14/15 0329  GLUCAP 235* 264* 310*   Insulin Requirements in the past 24 hours:  51 units SSI + 25 units insulin in TPN bag  Admit: admitted after CT showed increased in AAA size- not a candidate for stent graft repair, but could have open repair.  Remained intubated post-surgery until 6/10 with AKI. Now with post-op ileus to start TPN.  Surgeries/Procedures: 6/6: open AAA repair  GI: BM charted 6/17. unable to tolerate TF. With NGT 947m output in last 24h. Can't get swallow eval d/t mental status as well as ileus, now reintubated. Had large tarry stool 6/14 AM and Hgb dropped - on IV heparin (lovenox pta). Baseline prealbumin 13.1 (now 2.7). Docusate, Pepcid in TPN bag, Prostat  Endo:  A1C 6% on 6/6. CBGs >200 (230-310s) with SSI and insulin in TPN - Did not tolerate increase in rate 6/17 (decreased until new bag hung). Ongoing hyperglycemia issues  Lytes: hyponatremia - down to 122. K up 5.9, Phos now up to 10.1 (Ca x Phos ~94) and CorCa ~9.4. Mag 1.9. TPN already w/ no lytes  Renal: AKI- SCr trending higher 4.21>5.56 (baseline ~1.3). UOP down (0.4 ml/kg/hr) - scheduled Lasix BID. No needs for HD at this time 6/18  Pulm: extubated 6/10 and was on [place], then had increased work of breathing and was reintubated late evening on 64/56 Metabolic/Resp. Acidotic 6/18 - vent adjustments made, renal adding bicarb. Propofol stopped, now Fentanyl/Versed for vent dyssynchrony. Likely to need trach -Pulmicort, duonebs  Cards: Hypotensive but developed afib - IV amio - requiring Phenylephrine at 2088m/min - Lopressor stopped  Hepatobil: AST up 115,  ALT down 77, Alk phos 47>64, TBili up 2.1. Albumin low at 1.2. Trigs 16256>389Neuro: very restless and agitated. Started on Zyprexa. Fent/versed gtts  ID:  WBC wnl, tmax/24h 101. PCT - 1.87>>3.01.  MRSA PCR- neg  On:  Levofloxacin 6/16>>   Vancomycin 6/16>>  Resp. Culture 6/14 >>mult org, none predominant Resp. Culture 6/15 >> mult org, none predominant Blood Culture 6/15 >> NGTD Urine Culture 6/15 >> NGF  Best Practices: hep IV, PPI PT, MC  TPN Access: RIJ CVC double lumen placed 6/6>> TPN start date: 6/13>>  Current Nutrition:  Clinimix NO LYTES 5/15 @ 5056mr-provides 60g protein and 842kcal +Prostat 26m18m BID- each 30mL20mvides 15g protein and 72kcal (has missed some doses)  Fluids: 0.9% NS at 10ml/34m50meq 58mum Bicarb/Sterile water at 75ml/hr92mutritional Goals: (per RD recommendation on 6/15) 2380 kCal, 130-140 grams of protein per day  Plan:  -Continue Clinimix NO LYTES 5/15 at 50mL/hr 90mght. Will not increase rate with hyperglycemia issues. Titrate slowly to goal rate -Will hold IV lipids at this time as patient is critically ill in the ICU. D#7/7 of holding lipids. This formula will provide 60g protein and 842kcal in a 24h period.  -MVI and trace elements in TPN -Increase regular insulin to 50 units in TPN bag. Continue SSI at resistant and CBGs q4h -Continue Pepcid to 20mg in T63mMonitor TPN labs  Kyani Simkin BairElicia LampBCPS CliniHe[facility]the[facility] LLCPharmacist Pager [phone] 8:06 AM

## 2015-07-14 NOTE — Progress Notes (Signed)
[facility]  Subjective  - sedated earlier today now off, not really responsive   Objective 97/61 95 98.2 F (36.8 C) (Axillary) 25 100%  Intake/Output Summary (Last 24 hours) at 07/14/15 1226 Last data filed at 07/14/15 1100  Gross per 24 hour  Intake 6923.51 ml  Output   1140 ml  Net 5783.51 ml   Abdomen soft incision healing 1st toe dusky bilaterally feet warm legs feet edematous On vent fairly minimal settings chest xray mild edema no real pneumonia NG still high output bilious Urine output adequate  Assessment/Planning: Pulmonary: VDRF agree with trach hopefully wean after  Neuro: had been confused prior to reintubation hopefully mental status will improve as sedation off and electrolytes improve  GI still high output from NG no real BM: will place postpyloric tube to try some trickle feeds.  Will check amylase and lipase to rule out pancreatitis. Cont TPN  ID on antibiotics for presumed pneumonia as source of low grade sepsis  Cardiac: brief aystole today 15 sec witnessed returned without intervention or CPR.  Currently on Amiodarone for afib.  Heparin to protect cardiac stents until plavix can be restarted  Renal: high output renal failure.  Starting CVVH today hopefully can recover from 2nd hit to kidneys.  Multiple electrolyte disturbances need to be careful with rapid fluctuations in sodium to prevent cerebral injury. Adjust per dialysis and TPN.  Appreciate renal assistance.  Hyperglycemia: increase insulin in TPN versus sliding scale?   Fabienne BrunsFields, Charles 07/14/2015 12:26 PM --  Laboratory Lab Results:  Recent Labs  07/13/15 0314 07/14/15 0500  WBC 6.6 6.4  HGB 9.6* 8.9*  HCT 31.0* 27.2*  PLT 147* 77*   BMET  Recent Labs  07/14/15 0330 07/14/15 0500  NA 123* 122*  K 5.6* 5.9*  CL 95* 95*  CO2 14* 15*  GLUCOSE 294* 281*  BUN 99* 100*  CREATININE 5.47* 5.56*  CALCIUM 7.3* 7.1*    COAG Lab Results   Component Value Date   INR 1.24 07/03/2015   INR 1.17 07/02/2015   INR 1.32 2015/04/14   No results found for: PTT

## 2015-07-14 NOTE — Progress Notes (Signed)
Patient placed back on full support due to period of asystole

## 2015-07-14 NOTE — Progress Notes (Signed)
ANTICOAGULATION CONSULT NOTE - Follow up Consult  Pharmacy Consult for Heparin Indication: hx CAD w/ recent stents  Allergies  Allergen Reactions  . Amoxicillin Swelling    Only happened with 500 mg dose, can handle 250 mg  . Penicillins Swelling    Face swelling overnight within the last 5 years Has patient had a PCN reaction causing immediate rash, facial/tongue/throat swelling, SOB or lightheadedness with hypotension: No Has patient had a PCN reaction causing severe rash involving mucus membranes or skin necrosis: No Has patient had a PCN reaction that required hospitalization No Has patient had a PCN reaction occurring within the last 10 years: Yes If all of the above answers are "NO", then may proceed with Cephalosporin use.   . Exelon [Rivastigmine Tartrate] Rash    Patch    Patient Measurements: Height: 5\' 11"  (180.3 cm) Weight: 226 lb 6.6 oz (102.7 kg) IBW/kg (Calculated) : 75.3  Vital Signs: Temp: 97.9 F (36.6 C) (06/19 1300) Temp Source: Axillary (06/19 1300) BP: 97/61 mmHg (06/19 1148) Pulse Rate: 95 (06/19 1148)  Labs:  Recent Labs  07/13/15 0314  07/13/15 1055 07/13/15 2000 07/14/15 0330 07/14/15 0500 07/14/15 1300  HGB 9.6*  --   --   --   --  8.9* 8.4*  HCT 31.0*  --   --   --   --  27.2* 25.6*  PLT 147*  --   --   --   --  77* 62*  HEPARINUNFRC  --   < > 0.26* 0.19* 0.53  --   --   CREATININE 4.21*  --   --   --  5.47* 5.56* 5.79*  TROPONINI  --   --   --   --   --   --  4.51*  < > = values in this interval not displayed.  Estimated Creatinine Clearance: 14.5 mL/min (by C-G formula based on Cr of 5.79).  Assessment: 69yom s/p 2 overlapping stents to his circumflex (02/11/15) underwent AAA repair on 07/17/2015. Dual anti-platelet therapy with aspirin and plavix was interrupted for the procedure and patient now with significant ileus post-op and remains NPO. He was started on heparin to avoid the risk of stent thrombosis while aspirin and plavix on  hold.   HL this am on 2000 units/hr was at goal. Heparin then turned off for perm cath placement so that CRRT could be restarted.   Heparin restarted this afternoon after CE's came back positive post brief asystole/brady arrest.   D/w CCM this afternoon about the rapid decline in pltc over the past few days and continued decline today. Heparin restarted, will recheck HL/CBC tonight if pltc <50 will need to consult with on-call CCM about starting argatroban for possible HIT. Informed day nurse of plan.  Goal of Therapy:  Heparin level 0.3-0.7 units/ml Monitor platelets by anticoagulation protocol: Yes   Plan: Continue heparin at 2000 units/hr for now Check HL/cbc tonight  Sheppard CoilFrank Wilson PharmD., BCPS Clinical Pharmacist Pager [phone] 07/14/2015 2:57 PM

## 2015-07-14 NOTE — Progress Notes (Signed)
   [facility] PCCM  (-) recurrence of sinus brady. VSS. On neosynephrine drip.  On CVVH -- tolerating so far. Troponin was 4.  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 EKG on 6/17 was similar but q waves more prominent in septal leads in todays EKG.  Pt is on heparin drip already. Will give ASA. Plt cnt also dropping  Plan : cont present manangement. Cont CVVH. Cont heparin drip. Give ASA. Trend troponin. Check cbc at 8 pm >>  if plt < 50K, will consider HIT and order argatroban. Pharmacy awake.   Pollie MeyerJ. Angelo A de Dios, MD 07/14/2015, 2:55 PM [facility] Pager [phone] After 3 pm or if no answer, call [phone]

## 2015-07-14 NOTE — Progress Notes (Signed)
ANTICOAGULATION CONSULT NOTE - Follow Up Consult  Pharmacy Consult for heparin Indication: CAD   Labs:  Recent Labs  07/12/15 0330 07/12/15 1555 07/13/15 0314  07/13/15 1055 07/13/15 2000 07/14/15 0330  HGB 10.3*  --  9.6*  --   --   --   --   HCT 34.2*  --  31.0*  --   --   --   --   PLT 226  --  147*  --   --   --   --   HEPARINUNFRC 0.35  --   --   < > 0.26* 0.19* 0.53  CREATININE 3.21* 3.62* 4.21*  --   --   --   --   < > = values in this interval not displayed.   Assessment/Plan:  70yo male therapeutic on heparin after rate change. Will continue gtt at current rate and confirm stable with additional level.   Vernard GamblesVeronda Nylee Barbuto, PharmD, BCPS  07/14/2015,4:18 AM

## 2015-07-15 ENCOUNTER — Inpatient Hospital Stay (HOSPITAL_COMMUNITY): Payer: Medicare Other

## 2015-07-15 DIAGNOSIS — E872 Acidosis, unspecified: Secondary | ICD-10-CM | POA: Insufficient documentation

## 2015-07-15 DIAGNOSIS — J9600 Acute respiratory failure, unspecified whether with hypoxia or hypercapnia: Secondary | ICD-10-CM | POA: Insufficient documentation

## 2015-07-15 LAB — GLUCOSE, CAPILLARY
GLUCOSE-CAPILLARY: 211 mg/dL — AB (ref 65–99)
GLUCOSE-CAPILLARY: 172 mg/dL — AB (ref 65–99)
Glucose-Capillary: 217 mg/dL — ABNORMAL HIGH (ref 65–99)
Glucose-Capillary: 179 mg/dL — ABNORMAL HIGH (ref 65–99)
Glucose-Capillary: 201 mg/dL — ABNORMAL HIGH (ref 65–99)
Glucose-Capillary: 169 mg/dL — ABNORMAL HIGH (ref 65–99)

## 2015-07-15 LAB — POCT I-STAT 3, ART BLOOD GAS (G3+)
ACID-BASE DEFICIT: 5 mmol/L — AB (ref 0.0–2.0)
ACID-BASE DEFICIT: 8 mmol/L — AB (ref 0.0–2.0)
ACID-BASE DEFICIT: 7 mmol/L — AB (ref 0.0–2.0)
Acid-base deficit: 9 mmol/L — ABNORMAL HIGH (ref 0.0–2.0)
BICARBONATE: 18.4 meq/L — AB (ref 20.0–24.0)
Bicarbonate: 14.8 mEq/L — ABNORMAL LOW (ref 20.0–24.0)
Bicarbonate: 17.9 mEq/L — ABNORMAL LOW (ref 20.0–24.0)
Bicarbonate: 18.3 mEq/L — ABNORMAL LOW (ref 20.0–24.0)
O2 Saturation: 96 %
O2 Saturation: 100 %
O2 Saturation: 93 %
O2 Saturation: 94 %
PCO2 ART: 23.8 mmHg — AB (ref 35.0–45.0)
PH ART: 7.276 — AB (ref 7.350–7.450)
PH ART: 7.331 — AB (ref 7.350–7.450)
PO2 ART: 282 mmHg — AB (ref 80.0–100.0)
PO2 ART: 77 mmHg — AB (ref 80.0–100.0)
Patient temperature: 98.6
TCO2: 16 mmol/L (ref 0–100)
TCO2: 19 mmol/L (ref 0–100)
TCO2: 19 mmol/L (ref 0–100)
TCO2: 19 mmol/L (ref 0–100)
pCO2 arterial: 29.6 mmHg — ABNORMAL LOW (ref 35.0–45.0)
pCO2 arterial: 38.7 mmHg (ref 35.0–45.0)
pCO2 arterial: 34.9 mmHg — ABNORMAL LOW (ref 35.0–45.0)
pH, Arterial: 7.395 (ref 7.350–7.450)
pH, Arterial: 7.401 (ref 7.350–7.450)
pO2, Arterial: 73 mmHg — ABNORMAL LOW (ref 80.0–100.0)
pO2, Arterial: 79 mmHg — ABNORMAL LOW (ref 80.0–100.0)

## 2015-07-15 LAB — RENAL FUNCTION PANEL
ANION GAP: 19 — AB (ref 5–15)
Albumin: 1.1 g/dL — ABNORMAL LOW (ref 3.5–5.0)
Albumin: 1.4 g/dL — ABNORMAL LOW (ref 3.5–5.0)
Anion gap: 18 — ABNORMAL HIGH (ref 5–15)
BUN: 76 mg/dL — AB (ref 6–20)
BUN: 65 mg/dL — ABNORMAL HIGH (ref 6–20)
CALCIUM: 6.7 mg/dL — AB (ref 8.9–10.3)
CHLORIDE: 87 mmol/L — AB (ref 101–111)
CO2: 20 mmol/L — AB (ref 22–32)
CO2: 17 mmol/L — AB (ref 22–32)
Calcium: 7.1 mg/dL — ABNORMAL LOW (ref 8.9–10.3)
Chloride: 86 mmol/L — ABNORMAL LOW (ref 101–111)
Creatinine, Ser: 4.53 mg/dL — ABNORMAL HIGH (ref 0.61–1.24)
Creatinine, Ser: 4.12 mg/dL — ABNORMAL HIGH (ref 0.61–1.24)
GFR calc Af Amer: 14 mL/min — ABNORMAL LOW (ref >60)
GFR calc non Af Amer: 13 mL/min — ABNORMAL LOW (ref >60)
GFR, EST AFRICAN AMERICAN: 16 mL/min — AB (ref >60)
GFR, EST NON AFRICAN AMERICAN: 12 mL/min — AB (ref >60)
Glucose, Bld: 238 mg/dL — ABNORMAL HIGH (ref 65–99)
Glucose, Bld: 176 mg/dL — ABNORMAL HIGH (ref 65–99)
PHOSPHORUS: 9.4 mg/dL — AB (ref 2.5–4.6)
POTASSIUM: 4.4 mmol/L (ref 3.5–5.1)
POTASSIUM: 5.7 mmol/L — AB (ref 3.5–5.1)
Phosphorus: 8.7 mg/dL — ABNORMAL HIGH (ref 2.5–4.6)
SODIUM: 122 mmol/L — AB (ref 135–145)
Sodium: 125 mmol/L — ABNORMAL LOW (ref 135–145)

## 2015-07-15 LAB — CULTURE, BLOOD (SINGLE): Culture: NO GROWTH

## 2015-07-15 LAB — COMPREHENSIVE METABOLIC PANEL
ALBUMIN: 1.2 g/dL — AB (ref 3.5–5.0)
ALT: 226 U/L — ABNORMAL HIGH (ref 17–63)
ANION GAP: 18 — AB (ref 5–15)
AST: 272 U/L — AB (ref 15–41)
Alkaline Phosphatase: 98 U/L (ref 38–126)
BUN: 78 mg/dL — AB (ref 6–20)
CHLORIDE: 87 mmol/L — AB (ref 101–111)
CO2: 19 mmol/L — ABNORMAL LOW (ref 22–32)
Calcium: 7.1 mg/dL — ABNORMAL LOW (ref 8.9–10.3)
Creatinine, Ser: 4.58 mg/dL — ABNORMAL HIGH (ref 0.61–1.24)
GFR calc Af Amer: 14 mL/min — ABNORMAL LOW (ref >60)
GFR calc non Af Amer: 12 mL/min — ABNORMAL LOW (ref >60)
GLUCOSE: 244 mg/dL — AB (ref 65–99)
POTASSIUM: 4.3 mmol/L (ref 3.5–5.1)
Sodium: 124 mmol/L — ABNORMAL LOW (ref 135–145)
TOTAL PROTEIN: 5 g/dL — AB (ref 6.5–8.1)
Total Bilirubin: 1.7 mg/dL — ABNORMAL HIGH (ref 0.3–1.2)

## 2015-07-15 LAB — TROPONIN I
TROPONIN I: 6.49 ng/mL — AB (ref <0.031)
Troponin I: 5.82 ng/mL (ref <0.031)
Troponin I: 5.88 ng/mL (ref <0.031)
Troponin I: 7.98 ng/mL (ref <0.031)

## 2015-07-15 LAB — APTT
APTT: 85 s — AB (ref 24–37)
APTT: 97 s — AB (ref 24–37)
APTT: 120 s — AB (ref 24–37)
APTT: 109 s — AB (ref 24–37)
aPTT: 143 seconds — ABNORMAL HIGH (ref 24–37)

## 2015-07-15 LAB — CBC
HCT: 28.9 % — ABNORMAL LOW (ref 39.0–52.0)
Hemoglobin: 9.7 g/dL — ABNORMAL LOW (ref 13.0–17.0)
MCH: 29.7 pg (ref 26.0–34.0)
MCHC: 33.6 g/dL (ref 30.0–36.0)
MCV: 88.4 fL (ref 78.0–100.0)
PLATELETS: 46 10*3/uL — AB (ref 150–400)
RBC: 3.27 MIL/uL — AB (ref 4.22–5.81)
RDW: 16.4 % — AB (ref 11.5–15.5)
WBC: 6.3 10*3/uL (ref 4.0–10.5)

## 2015-07-15 LAB — AMYLASE: AMYLASE: 49 U/L (ref 28–100)

## 2015-07-15 LAB — LIPASE, BLOOD: LIPASE: 12 U/L (ref 11–51)

## 2015-07-15 LAB — MAGNESIUM: MAGNESIUM: 2 mg/dL (ref 1.7–2.4)

## 2015-07-15 LAB — VANCOMYCIN, RANDOM: Vancomycin Rm: 17 ug/mL

## 2015-07-15 LAB — LACTIC ACID, PLASMA: Lactic Acid, Venous: 9 mmol/L (ref 0.5–2.0)

## 2015-07-15 MED ORDER — NOREPINEPHRINE BITARTRATE 1 MG/ML IV SOLN: 2.0000 ug/min | INTRAVENOUS | Status: DC

## 2015-07-15 MED ORDER — NEPRO/CARBSTEADY PO LIQD
1000.0000 mL | ORAL | Status: DC
  Administered 2015-07-15: 1000 mL
  Filled 2015-07-15: qty 1000

## 2015-07-15 MED ORDER — WHITE PETROLATUM GEL
Freq: Two times a day (BID) | Status: DC
  Administered 2015-07-15 (×2): 0.2 via TOPICAL
  Filled 2015-07-15 (×2): qty 28.35

## 2015-07-15 MED ORDER — VECURONIUM BROMIDE 10 MG IV SOLR: 5.0000 mg | Freq: Once | INTRAVENOUS | Status: DC

## 2015-07-15 MED ORDER — ARGATROBAN 50 MG/50ML IV SOLN
0.1500 ug/kg/min | INTRAVENOUS | Status: DC
  Administered 2015-07-15: 0.28 ug/kg/min via INTRAVENOUS

## 2015-07-15 MED ORDER — VECURONIUM BROMIDE 10 MG IV SOLR
10.0000 mg | Freq: Once | INTRAVENOUS | Status: AC
  Administered 2015-07-15: 10 mg via INTRAVENOUS
  Filled 2015-07-15: qty 10

## 2015-07-15 MED ORDER — ALBUMIN HUMAN 25 % IV SOLN
25.0000 g | Freq: Once | INTRAVENOUS | Status: AC
  Administered 2015-07-15: 25 g via INTRAVENOUS
  Filled 2015-07-15: qty 50

## 2015-07-15 MED ORDER — VANCOMYCIN HCL IN DEXTROSE 1-5 GM/200ML-% IV SOLN
1000.0000 mg | INTRAVENOUS | Status: DC
  Administered 2015-07-15: 1000 mg via INTRAVENOUS
  Filled 2015-07-15: qty 200

## 2015-07-15 MED ORDER — ARGATROBAN 50 MG/50ML IV SOLN
0.3500 ug/kg/min | INTRAVENOUS | Status: DC
  Filled 2015-07-15: qty 50

## 2015-07-15 MED ORDER — VECURONIUM BROMIDE 10 MG IV SOLR
INTRAVENOUS | Status: AC
  Filled 2015-07-15: qty 10

## 2015-07-15 MED ORDER — VASOPRESSIN 20 UNIT/ML IV SOLN
0.0300 [IU]/min | INTRAVENOUS | Status: DC
  Filled 2015-07-15: qty 2

## 2015-07-15 MED ORDER — VECURONIUM BROMIDE 10 MG IV SOLR
5.0000 mg | Freq: Once | INTRAVENOUS | Status: AC
  Administered 2015-07-15: 5 mg via INTRAVENOUS

## 2015-07-15 MED ORDER — NEPRO/CARBSTEADY PO LIQD
237.0000 mL | ORAL | Status: DC
  Filled 2015-07-15: qty 237

## 2015-07-15 MED ORDER — TRACE MINERALS CR-CU-MN-SE-ZN 10-1000-500-60 MCG/ML IV SOLN
INTRAVENOUS | Status: DC
  Administered 2015-07-15: 17:00:00 via INTRAVENOUS
  Filled 2015-07-15: qty 1200

## 2015-07-15 MED ORDER — ANTICOAGULANT SODIUM CITRATE 4% (200MG/5ML) IV SOLN
5.0000 mL | Status: DC | PRN
  Filled 2015-07-15: qty 250

## 2015-07-15 MED ORDER — ARGATROBAN 50 MG/50ML IV SOLN: 0.0600 ug/kg/min | INTRAVENOUS | Status: DC

## 2015-07-15 MED ORDER — FAT EMULSION 20 % IV EMUL
240.0000 mL | INTRAVENOUS | Status: DC
  Administered 2015-07-15: 240 mL via INTRAVENOUS
  Filled 2015-07-15: qty 250

## 2015-07-15 MED ORDER — EPINEPHRINE HCL 1 MG/ML IJ SOLN
0.5000 ug/min | INTRAVENOUS | Status: DC
  Administered 2015-07-15: 0.5 ug/min via INTRAVENOUS
  Filled 2015-07-15: qty 4

## 2015-07-15 MED ORDER — NOREPINEPHRINE BITARTRATE 1 MG/ML IV SOLN
0.0000 ug/min | INTRAVENOUS | Status: DC
  Administered 2015-07-15: 20 ug/min via INTRAVENOUS
  Administered 2015-07-15: 10 ug/min via INTRAVENOUS
  Filled 2015-07-15 (×3): qty 16

## 2015-07-15 NOTE — Progress Notes (Signed)
Pt converted from rapid afib rate130's to NSR rate 70's; will cont. To monitor

## 2015-07-15 NOTE — Progress Notes (Signed)
ANTICOAGULATION CONSULT NOTE - Follow up Consult  Pharmacy Consult for argatroban Indication: hx CAD w/ recent stents, r/o HIT  Allergies  Allergen Reactions  . Amoxicillin Swelling    Only happened with 500 mg dose, can handle 250 mg  . Penicillins Swelling    Face swelling overnight within the last 5 years Has patient had a PCN reaction causing immediate rash, facial/tongue/throat swelling, SOB or lightheadedness with hypotension: No Has patient had a PCN reaction causing severe rash involving mucus membranes or skin necrosis: No Has patient had a PCN reaction that required hospitalization No Has patient had a PCN reaction occurring within the last 10 years: Yes If all of the above answers are "NO", then may proceed with Cephalosporin use.   . Exelon [Rivastigmine Tartrate] Rash    Patch    Patient Measurements: Height: 5\' 11"  (180.3 cm) Weight: 225 lb 12 oz (102.4 kg) IBW/kg (Calculated) : 75.3  Vital Signs: Temp: 99 F (37.2 C) (06/20 1940) Temp Source: Oral (06/20 1940) BP: 61/47 mmHg (06/20 2045) Pulse Rate: 71 (06/20 2045)  Labs:  Recent Labs  07/13/15 2000 07/14/15 0330  07/14/15 1300 07/14/15 1632  07/14/15 2023   0423  0651  1000  1515  2020  HGB  --   --   < > 8.4*  --   --  9.3*  --  9.7*  --   --   --   --   HCT  --   --   < > 25.6*  --   --  27.8*  --  28.9*  --   --   --   --   PLT  --   --   < > 62*  --   --  45*  --  46*  --   --   --   --   APTT  --   --   --   --   --   --   --   < > 85* 97*  --  120* 143*  HEPARINUNFRC 0.19* 0.53  --   --   --   --  0.49  --   --   --   --   --   --   CREATININE  --  5.47*  < > 5.79* 5.43*  --   --   --  4.58*  4.53*  --   --  4.12*  --   TROPONINI  --   --   --  4.51*  --   < >  --   < > 6.49*  --  5.88* 7.98*  --   < > = values in this interval not displayed.  Estimated Creatinine Clearance: 20.3 mL/min (by C-G formula based on Cr of 4.12).  Assessment: 69yom s/p  2 overlapping stents to his circumflex (02/11/15) underwent AAA repair on 07/10/2015. Dual anti-platelet therapy with aspirin and plavix was interrupted for the procedure and patient now with significant ileus post-op and remains NPO. He was started on heparin to avoid the risk of stent thrombosis while aspirin and plavix on hold.   Pt's platelet count has been dropping in the past 48 hrs, now down to 46K(plateued), argatroban started last night. aPTT has been trending up now at 143 despite multiple rate reduction from 0.5 to 0.15 mcg/kg/min. LFTs are also trending up, indicating pt is likely in liver failure, not clearing argatroban at normal rate. No bleeding noted per RN  Argatroban t1/2 for pt with  hepatic failure is ~ 3hrs  Goal of Therapy:  aPTT 50-90 sec  Monitor platelets by anticoagulation protocol: Yes   Plan: Hold argatroban for 3 hrs (giving more time for drug to clear) Restart argatroban at midnight and reduce to rate to 0.06 mcg/kg/min F/u AM aPTT  Bayard Hugger, PharmD, BCPS  Clinical Pharmacist  Pager: [phone]    8:56 PM

## 2015-07-15 NOTE — Progress Notes (Signed)
ANTICOAGULATION CONSULT NOTE - Follow up Consult  Pharmacy Consult for argatroban Indication: hx CAD w/ recent stents, r/o HIT  Allergies  Allergen Reactions  . Amoxicillin Swelling    Only happened with 500 mg dose, can handle 250 mg  . Penicillins Swelling    Face swelling overnight within the last 5 years Has patient had a PCN reaction causing immediate rash, facial/tongue/throat swelling, SOB or lightheadedness with hypotension: No Has patient had a PCN reaction causing severe rash involving mucus membranes or skin necrosis: No Has patient had a PCN reaction that required hospitalization No Has patient had a PCN reaction occurring within the last 10 years: Yes If all of the above answers are "NO", then may proceed with Cephalosporin use.   . Exelon [Rivastigmine Tartrate] Rash    Patch    Patient Measurements: Height: 5\' 11"  (180.3 cm) Weight: 226 lb 6.6 oz (102.7 kg) IBW/kg (Calculated) : 75.3  Vital Signs: Temp: 96.2 F (35.7 C) (06/19 2330) Temp Source: Axillary (06/19 2330) BP: 99/83 mmHg (06/19 2345) Pulse Rate: 47 (06/19 2230)  Labs:  Recent Labs  07/13/15 2000 07/14/15 0330 07/14/15 0500 07/14/15 1300 07/14/15 1632 07/14/15 1822 07/14/15 2023   HGB  --   --  8.9* 8.4*  --   --  9.3*  --   HCT  --   --  27.2* 25.6*  --   --  27.8*  --   PLT  --   --  77* 62*  --   --  45*  --   APTT  --   --   --   --   --   --   --  109*  HEPARINUNFRC 0.19* 0.53  --   --   --   --  0.49  --   CREATININE  --  5.47* 5.56* 5.79* 5.43*  --   --   --   TROPONINI  --   --   --  4.51*  --  4.86*  --  5.82*    Estimated Creatinine Clearance: 15.5 mL/min (by C-G formula based on Cr of 5.43).  Assessment: 69yom s/p 2 overlapping stents to his circumflex (02/11/15) underwent AAA repair on 07/03/2015. Dual anti-platelet therapy with aspirin and plavix was interrupted for the procedure and patient now with significant ileus post-op and remains NPO. He was started on heparin to  avoid the risk of stent thrombosis while aspirin and plavix on hold.   Pt's platelet count has been dropping in the past 48 hrs, now down to 45K, per discussion with CCM this afternoon, will change heparin to argatroban and check heparin antibody. On call CCM MD agreed with the plan. LFTs and T-bili are mildly elevated. aPTT supratherapeutic (109 sec) on 0.785mcg/kg/min (3.1 ml/hr). Hgb low but stable, no bleeding noted.  Goal of Therapy:  aPTT = 50-90  Monitor platelets by anticoagulation protocol: Yes   Plan: Decrease argatroban by 30% to 0.35 mcg/kg/min (2.2 ml/hr) aPTT in 2 hours  Christoper Fabianaron Tilman Mcclaren, PharmD, BCPS Clinical pharmacist, pager [phone]  1:12 AM

## 2015-07-15 NOTE — Progress Notes (Signed)
Physical Therapy Discharge Patient Details Name: Wyatt CarrelRalph C Holeman MRN: [id number] DOB: 08/30/1945 Today's Date:  Time:  -     Patient discharged from PT services secondary to medical decline - will need to re-order PT to resume therapy services.  Please see latest therapy progress note for current level of functioning and progress toward goals.    Progress and discharge plan discussed with patient and/or caregiver: Patient unable to participate in discharge planning and no caregivers available  GP     [facility] Lake HospitalMAYCOCK,Jake Goodson , 8:44 AM  [facility] CenterCary Aliviyah Malanga PT [phone]

## 2015-07-15 NOTE — Progress Notes (Signed)
Assessment/ Plan: Pt is a 70 y.o. yo male who was admitted on 07/25/2015 with AAA repair with resultant A on CRF  Assessment/Plan: 1. AAA- POD #12 repair- per vasc  2. Renal- baseline crt 1.3- A on CRF after procedure-?atheroembolic dz, worsening hyperkalemia and persistent acidosis; Will reduce UF due to low BP on CVVHD  3. Anemia- hgb stable 4. HTN/volume- shock on pressors and CRRT 5. Hyponatremia- persistent 6. Hyperkalemia, improved 7. Metabolic acidosis due to renal failure- on CRRT with bicarb  9 Thrombocytopenia, will avoid heparin with CRRT  Subjective: Interval History: Labile BP  Objective: Vital signs in last 24 hours: Temp:  [96.2 F (35.7 C)-98.2 F (36.8 C)] 96.3 F (35.7 C) (06/20 0731) Pulse Rate:  [25-112] 107 (06/20 0828) Resp:  [0-33] 25 (06/20 0828) BP: (43-159)/(14-130) 67/41 mmHg (06/20 0828) SpO2:  [80 %-100 %] 99 % (06/20 0745) FiO2 (%):  [40 %] 40 % (06/20 0829) Weight:  [102.4 kg (225 lb 12 oz)] 102.4 kg (225 lb 12 oz) (06/20 0337) Weight change: -0.3 kg (-10.6 oz)  Intake/Output from previous day: 06/19 0701 - 06/20 0700 In: 4144.1 [I.V.:2794.1; NG/GT:150; TPN:1200] Out: 4551 [Urine:910; Emesis/NG output:470] Intake/Output this shift: Total I/O In: 332.5 [I.V.:182.5; IV Piggyback:100; TPN:50] Out: 162 [Urine:125; Other:37]  General appearance: minimal responsive Extremities: edema 2+, purple toes, mottling of extrems Intubated Abd post op with echymotic regions  Lab Results:  Recent Labs  07/14/15 2023  0423  WBC 6.1 6.3  HGB 9.3* 9.7*  HCT 27.8* 28.9*  PLT 45* 46*   BMET:  Recent Labs  07/14/15 1632  0423  NA 123* 124*  125*  K 5.5* 4.3  4.4  CL 92* 87*  87*  CO2 17* 19*  20*  GLUCOSE 292* 244*  238*  BUN 97* 78*  76*  CREATININE 5.43* 4.58*  4.53*  CALCIUM 7.1* 7.1*  7.1*   No results for input(s): PTH in the last 72 hours. Iron Studies: No results for input(s): IRON, TIBC, TRANSFERRIN,  FERRITIN in the last 72 hours. Studies/Results: Dg Chest Port 1 View    CLINICAL DATA:  Respiratory failure, hypertension, coronary disease post MI, dementia, asthma, former smoker EXAM: PORTABLE CHEST 1 VIEW COMPARISON:  Portable exam 0740 hours compared 07/14/2015 FINDINGS: Tip of endotracheal tube projects 6.0 cm above carina. BILATERAL jugular central venous catheters with tips projecting over SVC. Normal heart size, mediastinal contours and pulmonary vascularity. Subsegmental atelectasis LEFT mid lung. Atelectasis versus consolidation BILATERAL lower lobes. No pleural effusion or pneumothorax. IMPRESSION: Atelectasis versus consolidation in BILATERAL lower lobes. Subsegmental atelectasis LEFT mid lung likely lingula. Electronically Signed   By: Lavonia Dana M.D.   On:  07:56   Dg Chest Port 1 View  07/14/2015  CLINICAL DATA:  New central line placement EXAM: PORTABLE CHEST 1 VIEW COMPARISON:  Earlier same day FINDINGS: Dual-lumen catheter inserted from a left internal jugular approach has its tip in the SVC at the azygos level. No pneumothorax. Endotracheal tube, nasogastric tube and right jugular catheter appear unchanged. Mild volume loss persists at the lung bases. IMPRESSION: New left internal jugular dual lumen central line has its tip in the SVC at the azygos level. No pneumothorax. Electronically Signed   By: Nelson Chimes M.D.   On: 07/14/2015 13:03   Dg Chest Port 1 View  07/14/2015  CLINICAL DATA:  Intubated patient, status post abdominal aortic aneurysm repair on July 01, 2015, acute respiratory failure and acute renal failure. EXAM: PORTABLE CHEST 1 VIEW COMPARISON:  Portable chest x-ray of July 13, 2015 FINDINGS: The lungs are adequately inflated. The interstitial markings are coarse at both lung bases but are stable. There is a trace of pleural fluid on the left which is also stable. There is no pneumothorax. The cardiac silhouette is top-normal in size but stable. The  pulmonary vascularity is normal. The endotracheal tube tip lies 4.6 cm above the carina. The esophagogastric tube tip projects below the inferior margin of the image. The right internal jugular venous catheter tip projects over the proximal SVC. IMPRESSION: Persistent mild bibasilar atelectasis with trace left pleural effusion. The support tubes are in stable position. Electronically Signed   By: David  Martinique M.D.   On: 07/14/2015 07:22    Scheduled: . antiseptic oral rinse  7 mL Mouth Rinse 10 times per day  . budesonide (PULMICORT) nebulizer solution  0.5 mg Nebulization BID  . chlorhexidine gluconate (SAGE KIT)  15 mL Mouth Rinse BID  . dextrose  1 ampule Intravenous Once  . docusate  100 mg Per Tube Daily  . feeding supplement (PRO-STAT SUGAR FREE 64)  60 mL Per Tube BID  . insulin aspart  0-20 Units Subcutaneous Q4H  . ipratropium-albuterol  3 mL Nebulization Q4H  . levofloxacin (LEVAQUIN) IV  500 mg Intravenous Q48H  . OLANZapine zydis  5 mg Oral Daily     LOS: 14 days   Alie Moudy C ,8:34 AM

## 2015-07-15 NOTE — Consult Note (Signed)
WOC wound consult note Reason for Consult: sacral ulcer Wound type: Deep tissue injury sacrum, critically ill patient. Now DNR, most likely will not survive per VVS and cardiology note. Patient sedated and maxed on pressors.  Most likely skin changes are related to poor vascular perfusion.  New areas on the right ear, purple with blistering not pressure related.  Heels are purple however most likely related to perfusion since toes are also purple and LE are mottled  And cool to the touch.  New area deep tissue injury on the sacrum which is what I was asked to evaluate today. Right nare with eschar now.  Pressure Ulcer POA: No Measurement: Sacrum: 11cm x 6cm x 0 Wound bed: dark purple with some superficial blistering  Drainage (amount, consistency, odor) none Periwound: intact  Dressing procedure/placement/frequency: Continue sacral foam for protection, assess for evolution of injury at least q shift.  On SPORT mattress for pressure redistribution. Continue microshifts in turning patient if possible, earlier today this patient was very hypotensive and was too unstable to make drastic body turns from side to side. Prevalon  boots now in place bilaterally.  Add vaseline to the right nare eschar.  WOC team will follow along with you for weekly wound assessments.  Please notify me of any acute changes in the wounds or any new areas of concerns Armen PickupMelody Skylene Deremer RN,CWOCN [phone]

## 2015-07-15 NOTE — Progress Notes (Addendum)
Time of death: 2318   Two RN's verified time of death.  Auscultated for heart sounds for 2 minutes.  None auscultated.  Dr. Myra GianottiBrabham notified of time of death.  Wilhelmina McardleLauren Hildegarde Dunaway, RN Feliberto GottronBrad Bass, RN

## 2015-07-15 NOTE — Progress Notes (Signed)
I have reviewed the chart. I met with patient's daughter and family. Do not think cardiac issue and troponin lead is probably due to multiorgan failure. No change in EKG with underlying RBBB. Probably gut ischemia ?? Embolic complication is main culprit with ongoing septic shock. I will be happy to be available to have any discussions with the family if need be.   Adrian Prows, MD , 11:03 AM [facility] [place] Pager: [phone] Office: [phone] If no answer: Cell:  [phone]

## 2015-07-15 NOTE — Progress Notes (Signed)
Pharmacy Antibiotic Note  Wyatt CarrelRalph C Miller is a 70 y.o. male admitted on 07/25/2015 with pneumonia.  Pharmacy has been consulted for vancomycin dosing.  Temp is variable 96.2 - 100, WBC has trended down to wnl, start CRRT 6/19. Random Vancomycin level is now down to 17  Plan: Vancomycin 1g IV Q 24 hrs Monitor CRRT  Height: 5\' 11"  (180.3 cm) Weight: 225 lb 12 oz (102.4 kg) IBW/kg (Calculated) : 75.3  Temp (24hrs), Avg:97.6 F (36.4 C), Min:96.2 F (35.7 C), Max:100 F (37.8 C)   Recent Labs Lab 07/13/15 0314 07/13/15 1305  07/14/15 0500 07/14/15 1300 07/14/15 1632 07/14/15 2023  0423  0815  1515  WBC 6.6  --   --  6.4 5.7  --  6.1 6.3  --   --   CREATININE 4.21*  --   < > 5.56* 5.79* 5.43*  --  4.58*  4.53*  --  4.12*  LATICACIDVEN  --   --   --   --   --   --   --   --  9.0*  --   VANCOTROUGH  --  33*  --   --   --   --   --   --   --   --   VANCORANDOM  --   --   --   --  27  --   --   --   --  17  < > = values in this interval not displayed.  Estimated Creatinine Clearance: 20.3 mL/min (by C-G formula based on Cr of 4.12).    Allergies  Allergen Reactions  . Amoxicillin Swelling    Only happened with 500 mg dose, can handle 250 mg  . Penicillins Swelling    Face swelling overnight within the last 5 years Has patient had a PCN reaction causing immediate rash, facial/tongue/throat swelling, SOB or lightheadedness with hypotension: No Has patient had a PCN reaction causing severe rash involving mucus membranes or skin necrosis: No Has patient had a PCN reaction that required hospitalization No Has patient had a PCN reaction occurring within the last 10 years: Yes If all of the above answers are "NO", then may proceed with Cephalosporin use.   . Exelon [Rivastigmine Tartrate] Rash    Patch    Antimicrobials this admission: Vanc 6/16>> Levaquin 6/16>>  Dose adjustments this admission: 6/18 VT: 33 on vancomycin 1250mg  q24h, hold and recheck  VR tomorrow 6/19 VR: 27: starting CRRT today 6/20 VR: 17, restart vancomycin 1g Q 24 hrs  Microbiology results: 6/14 TA - few gram variable rod  6/15 TA - GN coccobacilli  6/15 Urine - NEG  6/15 Blood - NGTD  Bayard HuggerMei Ichiro Chesnut, PharmD, BCPS  Clinical Pharmacist  Pager: [phone]    4:59 PM

## 2015-07-15 NOTE — Progress Notes (Signed)
    1100  Clinical Encounter Type  Visited With Family  Visit Type Initial  Spiritual Encounters  Spiritual Needs Grief support;Emotional  Stress Factors  Family Stress Factors Loss  Patient and family will have palliative care meeting this afternoon. Daughter is doing well, wife may not totally comprehend situation. Lucila MaineGrandson is having a tough time. Offered support and prayer. Azha Constantin, Chaplain

## 2015-07-15 NOTE — Progress Notes (Signed)
[facility]  Subjective  - became hypotensive around 7 am   Objective 67/41 29 96.3 F (35.7 C) (Axillary) 29 93%  Intake/Output Summary (Last 24 hours) at  1038 Last data filed at  1002  Gross per 24 hour  Intake 4148.52 ml  Output   4683 ml  Net -534.48 ml   Abdomen soft distended no real change still dark bilious NG output Right first toe dusky, feet perfused Anuric  Lipase normal  Assessment/Planning: Pt much worse this morning.  Multisystem organ failure now with worsening hypotension despite pressors. ECHO this morning as this may be cardiac event Sepsis ? Dead gut with elevation of lactate Agree with critical care and family updated that pt may not survive.  Will continue to see if we can stablize him during the day but agree with no CPR. Family understands he probably will not survive.  [name], [name]  10:38 AM --  Laboratory Lab Results:  Recent Labs  07/14/15 2023  0423  WBC 6.1 6.3  HGB 9.3* 9.7*  HCT 27.8* 28.9*  PLT 45* 46*   BMET  Recent Labs  07/14/15 1632  0423  NA 123* 124*  125*  K 5.5* 4.3  4.4  CL 92* 87*  87*  CO2 17* 19*  20*  GLUCOSE 292* 244*  238*  BUN 97* 78*  76*  CREATININE 5.43* 4.58*  4.53*  CALCIUM 7.1* 7.1*  7.1*    COAG Lab Results  Component Value Date   INR 1.24 07/03/2015   INR 1.17 07/02/2015   INR 1.32 04-21-2015   No results found for: PTT

## 2015-07-15 NOTE — Progress Notes (Signed)
[facility] PCCM   Pt remains critically ill. BP 70/40, HR 120, rr 20-30, 100% on 100% Fio2. Got a 1 time paralytic. One versed drip and fentanyl drip. On levophed and neo drips > maximum doses. Will add epi drip.  I extensively d/w ex wife Steward Drone(Brenda) and daughter  Annabelle Harman( Dana) over all poor prognosis.  They decided to make pt DNR - no acls, cardioversion, cardiac meds. They want to continue doing what we are doing.  Dr. Darrick PennaFields has been updated of events.  Pollie MeyerJ. Angelo A de Dios, MD , 10:02 AM [facility] Pager [phone] After 3 pm or if no answer, call [phone]

## 2015-07-15 NOTE — Progress Notes (Signed)
50mL versed WIS; witnessed by Ashley JacobsKasey Moore RN

## 2015-07-15 NOTE — Procedures (Signed)
Arterial Catheter Insertion Procedure Note Jaci CarrelRalph C Bergey [id number] 01/09/1946  Procedure: Insertion of Arterial Catheter  Indications: Blood pressure monitoring and Frequent blood sampling  Procedure Details Consent: Risks of procedure as well as the alternatives and risks of each were explained to the (patient/caregiver).  Consent for procedure obtained. Time Out: Verified patient identification, verified procedure, site/side was marked, verified correct patient position, special equipment/implants available, medications/allergies/relevent history reviewed, required imaging and test results available.  Performed  Maximum sterile technique was used including antiseptics, cap, gloves, gown, hand hygiene, mask and sheet. Skin prep: Chlorhexidine; local anesthetic administered 20 gauge catheter was inserted into left femoral artery using the Seldinger technique.  Evaluation Blood flow good; BP tracing good. Complications: No apparent complications.  Procedure performed under direct ultrasound guidance for real time vessel cannulation.      Rutherford Guysahul Acie Custis, GeorgiaPA - [facility] Pager: [phone]  or [phone] , 10:53 AM

## 2015-07-15 NOTE — Progress Notes (Signed)
   [facility] P[facility]M  Pt has the following : Multiple organ failure Acute Hypoxemic hypercapneic resp fx 2/2 pulm edema/HCAP CAD/NSTEMI/PVD AKI/Lactic acidosis/metabolic acidosis Shock combination of septic and cardiogenic Concern for Ischemic bowel as well.  Concern for HITT.  Some better as epi drip has been weaned off. Still on levophed and neo drip. Will add vasopressin.  I have been bolusing with vecuronium 5 mg IV. Got 1 dose at 8am and am giving another dose now.  If her remains to have "air hunger/gasping", may place pt on nimbex continuous infusion.  On argatroban drip.  Pt is DNR.   Pollie MeyerJ. Angelo A de Dios, MD , 3:35 PM [facility] Pager [phone] After 3 pm or if no answer, call [phone]

## 2015-07-15 NOTE — Progress Notes (Signed)
RT note-Advanced ETT 2cm per Dr. Tyson AliasFeinstein.

## 2015-07-15 NOTE — Progress Notes (Signed)
ANTICOAGULATION CONSULT NOTE - Follow up Consult  Pharmacy Consult for argatroban Indication: hx CAD w/ recent stents, r/o HIT  Allergies  Allergen Reactions  . Amoxicillin Swelling    Only happened with 500 mg dose, can handle 250 mg  . Penicillins Swelling    Face swelling overnight within the last 5 years Has patient had a PCN reaction causing immediate rash, facial/tongue/throat swelling, SOB or lightheadedness with hypotension: No Has patient had a PCN reaction causing severe rash involving mucus membranes or skin necrosis: No Has patient had a PCN reaction that required hospitalization No Has patient had a PCN reaction occurring within the last 10 years: Yes If all of the above answers are "NO", then may proceed with Cephalosporin use.   . Exelon [Rivastigmine Tartrate] Rash    Patch    Patient Measurements: Height: 5\' 11"  (180.3 cm) Weight: 225 lb 12 oz (102.4 kg) IBW/kg (Calculated) : 75.3  Vital Signs: Temp: 98.2 F (36.8 C) (06/20 1157) Temp Source: Axillary (06/20 1157) BP: 119/62 mmHg (06/20 1209) Pulse Rate: 116 (06/20 1209)  Labs:  Recent Labs  07/13/15 2000 07/14/15 0330  07/14/15 1300 07/14/15 1632  07/14/15 2023   0423  0651  1000  HGB  --   --   < > 8.4*  --   --  9.3*  --  9.7*  --   --   HCT  --   --   < > 25.6*  --   --  27.8*  --  28.9*  --   --   PLT  --   --   < > 62*  --   --  45*  --  46*  --   --   APTT  --   --   --   --   --   --   --  109* 85* 97*  --   HEPARINUNFRC 0.19* 0.53  --   --   --   --  0.49  --   --   --   --   CREATININE  --  5.47*  < > 5.79* 5.43*  --   --   --  4.58*  4.53*  --   --   TROPONINI  --   --   --  4.51*  --   < >  --  5.82* 6.49*  --  5.88*  < > = values in this interval not displayed.  Estimated Creatinine Clearance: 18.5 mL/min (by C-G formula based on Cr of 4.53).  Assessment: 69yom s/p 2 overlapping stents to his circumflex (02/11/15) underwent AAA repair on 07/04/2015.  Dual anti-platelet therapy with aspirin and plavix was interrupted for the procedure and patient now with significant ileus post-op and remains NPO. He was started on heparin to avoid the risk of stent thrombosis while aspirin and plavix on hold.   Pt's platelet count has been dropping in the past 48 hrs, now down to 46K(plateued), argatroban started last night. Follow up aptt this am was above goal at 97, will further reduce rate.  Goal of Therapy:  aPTT 50-90 sec  Monitor platelets by anticoagulation protocol: Yes   Plan: Decrease argatroban to 0.28 mcg/kg/min (2.2 ml/hr) PTT in 2 hours to confirm therapeutic  Sheppard CoilFrank Wilson PharmD., BCPS Clinical Pharmacist Pager [phone]  12:41 PM

## 2015-07-15 NOTE — Progress Notes (Signed)
PULMONARY / CRITICAL CARE MEDICINE   Name: [name] MRN: [id number] DOB: 1945-04-12    ADMISSION DATE:  07/22/2015 CONSULTATION DATE:  07/04/2015  REFERRING MD:  Dr. Darrick Penna  CHIEF COMPLAINT:  Vent Management   HISTORY OF PRESENT ILLNESS:  70 y.o. Male with a past medical history of Hypertension, CAD, AAA, High Cholesterol, Depression, Anxiety, Essential Tumor, Dementia, Asthma, COPD, former smoker, Anemia, MI, Pneumonia, and Stroke.  He was seen in February with CTA after his duplex revealed an increase in size from 4.8 cm to 5.2 cm.  The CTA revealed a 5.7 AAA.  On 5/11 he was told he was not a candidate for stent graft repair however he was a candidate for open repair.  He was taken to the OR on 6/6 for open AAA repair.  PCCM consulted on 6/6 for vent management  SUBJECTIVE:  Looks moribund Heparin dc'd 2/2 dropping plts. Argatroban started. More cyanosis in B feet, bottom, B ear lobes. Tolerating cvvh until last hr. BP dropped to 60 systolic. Net I and O 500. Levophed started. Very unfomfortable in vent. Gasping.     VITAL SIGNS: BP 64/43 mmHg  Pulse 58  Temp(Src) 96.3 F (35.7 C) (Axillary)  Resp 33  Ht  (1.803 m)  Wt 102.4 kg (225 lb 12 oz)  BMI 31.50 kg/m2  SpO2 80%  HEMODYNAMICS:    VENTILATOR SETTINGS: Vent Mode:  [-] PCV FiO2 (%):  [40 %] 40 % Set Rate:  [25 bmp] 25 bmp PEEP:  [5 cmH20] 5 cmH20 Plateau Pressure:  [24 cmH20-26 cmH20] 25 cmH20  INTAKE / OUTPUT:  Intake/Output Summary (Last 24 hours) at  0744 Last data filed at  0600  Gross per 24 hour  Intake 3972.51 ml  Output   4412 ml  Net -439.49 ml   PHYSICAL EXAMINATION: General:  Chronically ill appearing male, NAD on vent.  Off sedation and not following commads. Uncomfortable with gasping noted.  Neuro: sedated on vent, RASS -2 HEENT:  Mm dry, no JVD, NG tube. Cyanotic B ear lobes > worse last 24 hrs. (-) bleeding noted per ETT.  Cardiovascular:  S1s2 rrr  Lungs:  resps  even  labored on vent, few scattered rhonchi. Crackles at bases Abdomen:  Minimal bowel sounds, midline abdominal incision dressing dry and intact. Abd slightly distended, multiple bruises  Musculoskeletal:  Normal bulk, Gr 1  edema .cyanotic toes > worse. Cyanotic legs and knees > worse this am. Cyanotic sacral area.   LABS:  BMET  Recent Labs Lab 07/14/15 1300 07/14/15 1632  0423  NA 122* 123* 124*  125*  K 6.1* 5.5* 4.3  4.4  CL 92* 92* 87*  87*  CO2 16* 17* 19*  20*  BUN 101* 97* 78*  76*  CREATININE 5.79* 5.43* 4.58*  4.53*  GLUCOSE 349* 292* 244*  238*   Electrolytes  Recent Labs Lab 07/13/15 0314  07/14/15 0500 07/14/15 1300 07/14/15 1632  0423  CALCIUM 7.3*  < > 7.1* 7.6* 7.1* 7.1*  7.1*  MG 2.0  --  1.9  --   --  2.0  PHOS 6.7*  < > 10.1*  --  9.2* 8.7*  < > = values in this interval not displayed.  CBC  Recent Labs Lab 07/14/15 1300 07/14/15 2023  0423  WBC 5.7 6.1 6.3  HGB 8.4* 9.3* 9.7*  HCT 25.6* 27.8* 28.9*  PLT 62* 45* 46*   Coag's  Recent Labs Lab   0423  7829  APTT 109* 85* 97*   Sepsis Markers  Recent Labs Lab 07/11/15 0414 07/12/15 0330 07/13/15 0314  PROCALCITON 0.56 1.87 3.01   ABG  Recent Labs Lab 07/13/15 0323 07/14/15 0328 07/14/15 1210  PHART 7.271* 7.264* 7.306*  PCO2ART 28.4* 25.9* 24.3*  PO2ART 99.0 102.0* 114*   Liver Enzymes  Recent Labs Lab 07/10/15 0525  07/14/15 0500 07/14/15 1632  0423  AST 78*  --  115*  --  272*  ALT 108*  --  77*  --  226*  ALKPHOS 47  --  64  --  98  BILITOT 2.2*  --  2.1*  --  1.7*  ALBUMIN 1.7*  < > 1.2* 1.2* 1.2*  1.1*  < > = values in this interval not displayed.  Cardiac Enzymes  Recent Labs Lab 07/14/15 1822   0423  TROPONINI 4.86* 5.82* 6.49*    Glucose  Recent Labs Lab 07/14/15 0859 07/14/15 1445 07/14/15 1648 07/14/15 2035 07/14/15 2326  0355  GLUCAP 249*  284* 263* 201* 217* 211*   Imaging Dg Chest Port 1 View  07/14/2015  CLINICAL DATA:  New central line placement EXAM: PORTABLE CHEST 1 VIEW COMPARISON:  Earlier same day FINDINGS: Dual-lumen catheter inserted from a left internal jugular approach has its tip in the SVC at the azygos level. No pneumothorax. Endotracheal tube, nasogastric tube and right jugular catheter appear unchanged. Mild volume loss persists at the lung bases. IMPRESSION: New left internal jugular dual lumen central line has its tip in the SVC at the azygos level. No pneumothorax. Electronically Signed   By: Paulina Fusi M.D.   On: 07/14/2015 13:03   STUDIES:  Renal u/s 6/7>>> normal   CULTURES: Blood 6/14 > (-) Sputum 6/14 > multiple org MRSA screen 6/16 >  (-)  ANTIBIOTICS: Vancomycin 6/6>>  Levaquin 6/14>>> 6/18 Levaquin 6/18 >>  SIGNIFICANT EVENTS: 6/6-Pt hd open AAA repair remains intubated post surgery. Course c/b coagulopathy (likely dilutional), acute renal injury, shock.   LINES/TUBES: RIJ dual lumen CVC 6/6>> L radial aline 6/6>>6/10 PIV x1>> ETT 6/6>>6/10>>>6/16  DISCUSSION: On 5/11 he was told he was not a candidate for stent graft repair however he was a candidate for open repair.  He was taken to the OR on 6/6 for open AAA repair.  PCCM consulted on 6/6 for vent management, shock, AKI.  Creat rising. Worsening renal fxn over the weekend. Now in shock. HIT considered.   ASSESSMENT / PLAN:  PULMONARY A: Acute hypoxic respiratory failure- extubated 6/10 then re-intubated 6/15 2/2  Pulm edema, possible bibasilar HCAP, COPD Hx: COPD, former smoker  P:   Continue full vent support.  Not ready for weaning or trache.  Re start sedation 2/2 pt being uncomfortable and gasping.  Cont Duoneb, pulmicort. Continue abx.   CARDIOVASCULAR A:  Shock likely multifactorial : 2/2 sepsis/relative volume depletion with cvvh/cardiogenic with NSTEMI.  NSTEMI. Likely demand ischemia. Pt with CAD/PVD. Afib.   S/P AAA open repair P:  Troponin climbing. Was on heparin drip but was dc'd 2/2 plt dropping. Switched to argatroban with concern for HIT. Received ASA.   Check echo. On amio drip.  Consider consult cardiology Vascular surgery following. Telemetry monitoring. Cont pressors > neo, levophed  RENAL A:   Acute Renal failure, ATN  CKD st 3A Hyponatremia  P:   Started CVVH last 12 hrs. Last 1 hr, BP dropped. Told nurse to hold off on pulling fluid. Try to keep even Check abg.  BMET in AM. Replace  electrolytes as indicated.  GASTROINTESTINAL A:   Severe malnutrition Hx: Pyloric Spasms Nausea/vomiting  P:   NPO for now per VVS. Continue TPN. NG to suction. PPI for PUP. Stool occult blood negative.  HEMATOLOGIC A:   Pt with dropping plts likely multifactorial > from sepsis/meds/heparin. HITT is also a consideration.  Anemia  Hematuria Epistaxis - profuse nosebleed (nose packed per Vascular Surgery)-->packing now out  P:  Heparin drip has been stopped. On argatroban drip.  Monitor s/sx for bleeding F/u CBC   INFECTIOUS A:   CXR with atelectasis +/- bibasilar infiltrate.  Elevated PCT at 3 P:   Trend WBC's, fever curve  Levofloxacin and Vanc as ordered.  VAP prevention  Will monitor PCT.   ENDOCRINE Type II DM P:   CBG monitoring q4hrs SSI Hyper/Hypoglycemic protocol as indicated   NEUROLOGIC A:   Pain Hx: Depression, Anxiety, Essential tremor, Dementia P:   RASS goal: -1 Will restart versed drip 2/2 pt being uncomfortable and gasping. Fentanyl drip if bp tolerates.  D/Ced propofol Continue SL zyprexa for mood stabilization.  FAMILY  - Updates: Spoke and discussed with daughter Feliberto HartsDana Guise over all condition and prognosis and worsening clinical status the last 12 hrs..  Will need to readress code status.   Critical Care time on this patient today : 60  minutes.   Pollie MeyerJ. Angelo A de Dios, MD , 7:44 AM [facility] Pager  [phone] After 3 pm or if no answer, call [phone]

## 2015-07-15 NOTE — Progress Notes (Signed)
Time of Death: 2318    Time of death verified by two RNs auscultating for heart sounds for 2 minutes.  No heart sounds auscultated.  Marlou PorchBradley Brentney Goldbach, RN Wilhelmina McardleLauren Kennedy, RN

## 2015-07-15 NOTE — Progress Notes (Signed)
[place] NOTE  Pharmacy Consult for TPN Indication: prolonged ileus  Allergies  Allergen Reactions  . Amoxicillin Swelling    Only happened with 500 mg dose, can handle 250 mg  . Penicillins Swelling    Face swelling overnight within the last 5 years Has patient had a PCN reaction causing immediate rash, facial/tongue/throat swelling, SOB or lightheadedness with hypotension: No Has patient had a PCN reaction causing severe rash involving mucus membranes or skin necrosis: No Has patient had a PCN reaction that required hospitalization No Has patient had a PCN reaction occurring within the last 10 years: Yes If all of the above answers are "NO", then may proceed with Cephalosporin use.   . Exelon [Rivastigmine Tartrate] Rash    Patch   Patient Measurements: Height: _0  (180.3 cm) Weight: 225 lb 12 oz (102.4 kg) IBW/kg (Calculated) : 75.3  Vital Signs: Temp: 96.3 F (35.7 C) (06/20 0731) Temp Source: Axillary (06/20 0731) BP: 74/62 mmHg (06/20 0745) Pulse Rate: 102 (06/20 0745) Intake/Output from previous day: 06/19 0701 - 06/20 0700 In: 4144.1 [I.V.:2794.1; NG/GT:150; TPN:1200] Out: 1572 [Urine:910; Emesis/NG output:470]  Labs:  Recent Labs  07/14/15 1300 07/14/15 2023   0423  0651  WBC 5.7 6.1  --  6.3  --   HGB 8.4* 9.3*  --  9.7*  --   HCT 25.6* 27.8*  --  28.9*  --   PLT 62* 45*  --  46*  --   APTT  --   --  109* 85* 97*    Recent Labs  07/13/15 0314 07/13/15 0315  07/14/15 0500 07/14/15 1300 07/14/15 1632  0423  NA 128*  --   < > 122* 122* 123* 124*  125*  K 4.6  --   < > 5.9* 6.1* 5.5* 4.3  4.4  CL 104  --   < > 95* 92* 92* 87*  87*  CO2 14*  --   < > 15* 16* 17* 19*  20*  GLUCOSE 305*  --   < > 281* 349* 292* 244*  238*  BUN 85*  --   < > 100* 101* 97* 78*  76*  CREATININE 4.21*  --   < > 5.56* 5.79* 5.43* 4.58*  4.53*  CALCIUM 7.3*  --   < > 7.1* 7.6* 7.1* 7.1*  7.1*  MG 2.0  --    --  1.9  --   --  2.0  PHOS 6.7*  --   < > 10.1*  --  9.2* 8.7*  PROT  --   --   --  5.0*  --   --  5.0*  ALBUMIN 1.4*  --   < > 1.2*  --  1.2* 1.2*  1.1*  AST  --   --   --  115*  --   --  272*  ALT  --   --   --  77*  --   --  226*  ALKPHOS  --   --   --  64  --   --  98  BILITOT  --   --   --  2.1*  --   --  1.7*  PREALBUMIN  --   --   --  2.7*  --   --   --   TRIG  --  134  --  133  --   --   --   < > = values in this interval not displayed. Estimated Creatinine  Clearance: 18.5 mL/min (by C-G formula based on Cr of 4.53).   Recent Labs  07/14/15 2035 07/14/15 2326  0355  GLUCAP 201* 217* 211*   Insulin Requirements in the past 24 hours:  39 units SSI + 50 units insulin in TPN bag  Admit: admitted after CT showed increased in AAA size- not a candidate for stent graft repair, but could have open repair.  Remained intubated post-surgery until 6/10 with AKI. Now with post-op ileus to start TPN.  Surgeries/Procedures: 6/6: open AAA repair  GI: BM charted 6/17. unable to tolerate TF. NGT 1736m output in last 24h. Can't get swallow eval d/t mental status as well as ileus, now reintubated. To place post-pyloric tube and try trickle feeds. Hep>>IV argatroban (lovenox pta). Baseline prealbumin 13.1 (now 2.7). Docusate, Pepcid in TPN bag, Prostat  Endo:  A1C 6% on 6/6. CBGs improved (179-244) with SSI and increased insulin in TPN. Has had ongoing hyperglycemia issues.  Lytes: hyponatremia - up 124. k to wnl, Phos down 8.7, CorCa ~9.4, Mag 2. TPN already w/ no lytes (now CRRT)  Renal: AKI- SCr trending higher to 5.56 (baseline ~1.3) on 6/19 >> started CRRT. UOP down (0.4 ml/kg/hr) - lasix d/c'd. [place] gtt CRRT 6/19>>  Pulm: extubated 6/10 and was on [place], then had increased work of breathing and was reintubated late evening on 69/79 Metabolic/Resp Acidotic 68/92- vent adjustments made, renal adding bicarb. Propofol stopped, now Fentanyl/Versed for vent dyssynchrony. Trach  soon -Pulmicort, duonebs  Cards: Hypotensive but developed afib - IV amio - requiring Phenylephrine_0 , Norepi_1  - Lopressor stopped. Brief asystole/sinus brady on 6/19  Hepatobil: AST up 272, ALT up 226, Alk phos 47>64>98, TBili down 1.7. Albumin low at 1.1. Trigs 1119>417 Neuro: very restless and agitated. Started on Zyprexa. Fent/versed gtts  ID:  WBC wnl, low temps (CRRT). PCT - 1.87>>3.01.  MRSA PCR- neg  On:  Levofloxacin 6/16>>   Vancomycin 6/16>>  Resp. Culture 6/14 >>mult org, none predominant Resp. Culture 6/15 >> mult org, none predominant Blood Culture 6/15 >> NGTD Urine Culture 6/15 >> NGF  Best Practices: hep IV, PPI PT, MC  TPN Access: RIJ CVC double lumen placed 6/6>> TPN start date: 6/13>>  Current Nutrition:  Clinimix NO LYTES 5/15 @ 592mhr-provides 60g protein and 842kcal +Prostat 6036mT BID- each 33m57movides 15g protein and 72kcal (has missed some doses)  Fluids: 0.9% NS at 10ml26m150meq85mium Bicarb/Sterile water at 200ml/h46mRT   Nutritional Goals: (per RD recommendation on 6/15) 2380 kCal, 130-140 grams of protein per day  Plan:  -Continue Clinimix NO LYTES (CRRT) 5/15 to 50mL/hr61mh hyperglycemia issues + add lipids at 10ml/h T65mt (previously held x 7 days in ICU). Titrate slowly to goal rate.  -This formula will provide 60g protein and 1058 kcal in a 24h period.  -MVI and trace elements in TPN -Increase regular insulin to 80 units in TPN bag. Continue SSI at resistant and CBGs q4h -Continue Pepcid to 20mg in T84mF/u AM labs  Imanol Bihl BairElicia LampBCPS CliniHarford Endoscopy CenterPharmacist Pager [phone] 8:02 AM

## 2015-07-15 NOTE — Progress Notes (Signed)
Occupational Therapy Discharge Patient Details Name: Wyatt CarrelRalph C Shroff MRN: [id number] DOB: 02/22/1945 Today's Date:  Time:  -     Patient discharged from OT services secondary to medical decline - will need to re-order OT to resume therapy services.  Please see latest therapy progress note for current level of functioning and progress toward goals.    Progress and discharge plan discussed with patient and/or caregiver: Patient unable to participate in discharge planning and no caregivers available  GO     Angelene GiovanniConarpe, Stephannie Broner M  Cataleah Stites Clayonarpe, OTR/L [phone]  , 10:13 AM

## 2015-07-15 NOTE — Progress Notes (Signed)
ANTICOAGULATION CONSULT NOTE - Follow up Consult  Pharmacy Consult for argatroban Indication: hx CAD w/ recent stents, r/o HIT  Allergies  Allergen Reactions  . Amoxicillin Swelling    Only happened with 500 mg dose, can handle 250 mg  . Penicillins Swelling    Face swelling overnight within the last 5 years Has patient had a PCN reaction causing immediate rash, facial/tongue/throat swelling, SOB or lightheadedness with hypotension: No Has patient had a PCN reaction causing severe rash involving mucus membranes or skin necrosis: No Has patient had a PCN reaction that required hospitalization No Has patient had a PCN reaction occurring within the last 10 years: Yes If all of the above answers are "NO", then may proceed with Cephalosporin use.   . Exelon [Rivastigmine Tartrate] Rash    Patch    Patient Measurements: Height: 5\' 11"  (180.3 cm) Weight: 225 lb 12 oz (102.4 kg) IBW/kg (Calculated) : 75.3  Vital Signs: Temp: 96.2 F (35.7 C) (06/19 2330) Temp Source: Axillary (06/19 2330) BP: 70/59 mmHg (06/20 0430) Pulse Rate: 47 (06/19 2230)  Labs:  Recent Labs  07/13/15 2000 07/14/15 0330 07/14/15 0500 07/14/15 1300 07/14/15 1632 07/14/15 1822 07/14/15 2023   0423  HGB  --   --  8.9* 8.4*  --   --  9.3*  --   --   HCT  --   --  27.2* 25.6*  --   --  27.8*  --   --   PLT  --   --  77* 62*  --   --  45*  --   --   APTT  --   --   --   --   --   --   --  109* 85*  HEPARINUNFRC 0.19* 0.53  --   --   --   --  0.49  --   --   CREATININE  --  5.47* 5.56* 5.79* 5.43*  --   --   --   --   TROPONINI  --   --   --  4.51*  --  4.86*  --  5.82*  --     Estimated Creatinine Clearance: 15.4 mL/min (by C-G formula based on Cr of 5.43).  Assessment: 69yom s/p 2 overlapping stents to his circumflex (02/11/15) underwent AAA repair on 07/13/2015. Dual anti-platelet therapy with aspirin and plavix was interrupted for the procedure and patient now with significant ileus  post-op and remains NPO. He was started on heparin to avoid the risk of stent thrombosis while aspirin and plavix on hold.   Pt's platelet count has been dropping in the past 48 hrs, now down to 45K, per discussion with CCM this afternoon, will change heparin to argatroban and check heparin antibody. On call CCM MD agreed with the plan. LFTs and T-bili are mildly elevated. APTT therapeutic on 0.9135mcg/kg/min. No bleeding noted.  Goal of Therapy:  aPTT 50-90 sec  Monitor platelets by anticoagulation protocol: Yes   Plan: Continue argatroban 0.35 mcg/kg/min (2.2 ml/hr) PTT in 2 hours to confirm therapeutic  Christoper Fabianaron Tanor Glaspy, PharmD, BCPS Clinical pharmacist, pager [phone]  4:54 AM

## 2015-07-16 LAB — POCT I-STAT 3, ART BLOOD GAS (G3+)
ACID-BASE DEFICIT: 7 mmol/L — AB (ref 0.0–2.0)
BICARBONATE: 15.3 meq/L — AB (ref 20.0–24.0)
O2 Saturation: 97 %
PCO2 ART: 23.9 mmHg — AB (ref 35.0–45.0)
PH ART: 7.416 (ref 7.350–7.450)
PO2 ART: 90 mmHg (ref 80.0–100.0)
Patient temperature: 99.8
TCO2: 16 mmol/L (ref 0–100)

## 2015-07-16 LAB — HEPARIN INDUCED PLATELET AB (HIT ANTIBODY): Heparin Induced Plt Ab: 2.289 OD — ABNORMAL HIGH (ref 0.000–0.400)

## 2015-07-26 NOTE — Progress Notes (Signed)
Medications wasted:  Versed: 80 mL Fentanyl: 245 mL  Marlou PorchBradley Jack Bolio, RN Wilhelmina McardleLauren Kennedy, RN  1:24 AM

## 2015-07-26 NOTE — Progress Notes (Signed)
Narcotic waste:  Versed 80 mL flushed down sink. Fentanyl 245 mL flushed down sink.  Wilhelmina McardleLauren Chase Knebel, RN Marlou PorchBradley Bass, RN

## 2015-07-26 NOTE — Discharge Summary (Signed)
AAA Discharge Summary    [name] 03-Dec-1945 70 y.o. male  [id number]  Admission Date: 06/30/2015  Discharge Date:   Physician: No att. providers found  Admission Diagnosis: Abdominal aortic aneurysm I71.4   HPI:   This is a 70 y.o. male who has been followed for many years by Dr. Oneida Alar for AAA. He did have a cardiac catheterization in January requiring cardiac stents and is now on Plavix and aspirin daily. He was seen in February with CTA after his duplex revealed an increase in size from 4.8cm to 5.2cm. The CTA revealed a 5.7cm AAA. Unfortunately, the pt is not a candidate for stent graft repair and is here today for discussion for open repair. He has done well since the last visit. He does have an occasional abdominal pain, but this is not constant.   He does take a beta blocker and ACEI for blood pressure management. He is on metformin for diabetes. He does take a statin for cholesterol management. He is on Plavix and aspirin daily.   Hospital Course:  The patient was admitted to the hospital and taken to the operating room on 06/30/2015 and underwent: Repair of juxtarenal abdominal aortic aneurysm (16 mm Dacron graft), repair of inferior vena cava, left popliteal and tibial embolectomy.  That afternoon, he was coagulopathic with nosebleed requiring nasal packing.  He received 6 units of FFP and platelet pheresis pack as well as 2 units PRBC's for his acute surgical blood loss anemia.  He also had some hematuria.    The pt remained intubated and was requiring pressor support on POD 1.  He also had acute renal failure with hyperkalemia and decreased UOP.  A nephrology consult was obtained.  His IVF were d/c'd and he was given Lasix to help stimulate UOP and improve K+.    He had palpable pedal pulses with the left > right, which was most likely due to vasoconstriction.  He was placed on Abx for the nasal packing.   On POD 2, he was off levophed.  His nasal  packing was removed.  His feet were dusky, but continued to have 2+DP pulses.  He remained on the vent.    On POD 3, his hgb was decreased at 7.3 and he was given 2 units of PRBC's.  His creatinine had increased again.  His UOP had dropped off, but had picked back up.  Since his nasal packing was out, the Vancomycin was discontinued.  His diabetes remained under good control.   By POD 4, the pt had gone into rapid afib, which converted to NSR with amiodarone gtt.  He had appropriate response to transfusion.  Trophic tube feeds were started at 10cc/hr.  Creatinine was down and he was having good UOP.  He was extubated that afternoon.  On POD 5, he was wanting to get cleaned up.  He was not passing flatus.  His NGT was left in place until he was passing flatus given that it was very difficult to place.  KUB done the day before confirmed placement.  His foley was left in to monitor UOP.    Lovenox was started for DVT prophylaxis.  Protonix was continued for GI protection.  His beta blocker was increased for increased blood pressure.  He was give Ativan for anxiety.  Renal discontinued his IV lasix and was ordered prn. PT did work with the pt.  He was able to stand and pivot to recliner, but did require maximum 2 person assist as  he had some confusion and difficulty following commands.  By the morning of POD 6, he was groggy and confused, but was following commands.  His renal function continued to improve.  His IVF were restarted and changed to 1/2 NaCl for hypernatremia.  NGT was left in place due to increased NGT output/ileus.  His WBC started to trend upward, however, he remained afebrile.  That evening, the pt was confused and pulling at NGT and other tubes.  Mittens were placed for pt safety.  On POD 7, he was agitated and not really answering questions.  RN states he was restless the evening before and was given Ativan x 2 doses. TPN was started.  Sedation was trying to be used minimally.  He was  started on Heparin until able to take po per Dr. Einar Gip to protect coronary stent.  Per Dr. Einar Gip, Patient with recent long stent to a very large circumflex coronary artery, patient unable to take oral medications, discussed with Dr. Oneida Alar that he will need some form of an to correlation for now to avoid risk of stent thrombosis. I will start the patient on IV heparin. Would like to resume aspirin and Plavix at the earliest possible. Fortunately no acute cardiac issues for now. I will continue to follow the patient sidelines.  Pt's UOP continued to improve with improvement of creatinine.   On POD 8, his creatinine continue to trend downward with good urine output.  He was c/p pain at the right great toe, which did have some erythema-possible gout.  RN reported dark stool.  His hemoccult was negative.  Later that evening, the pt had more tachypnea.  An ABG was ordered. CCM MD at bedside. He was Called the patient's bedside for persistent hypoxic respiratory failure and tachypnea with respiratory rate in the 40s producing a respiratory alkalosis. Patient able to tell me his name and signify that he was having difficulty breathing. Stat portable chest x-ray shows a questionable right lower lobe opacity. Patient's significant other updated via phone regarding tenuous respiratory status. Patient did not respond to nebulizer treatment with prolonged exhalation phase. I can find no wheezing on physical exam. Given potential of worsening respiratory status further overnight I believe proceeding with an elective endotracheal intubation is the safest for the patient at this time. Patient's family agrees.  He was intubated a short time later.  By POD 9, he remained tachypnic and no response with nebulizers.  He was again requiring pressor support.  His hgb continued to decrease.   Hemoccult was negative.  His creatinine bumped slightly.  His hyperkalemia was improving (IVF had been changed to D5 per renal).  Fluids and  electrolytes were maintained to support renal recovery.  His WBC's continued to trend upward and he was pan cultured.  His acute surgical blood loss anemia was stable.  His hypernatremia as corrected.  He continued to have large NGT output.  He remained on TPN as he had prolonged ileus.  On POD 10, he was still requiring pressor support.  The respiratory gram stain revealed abundant GN coccobacilli and he was started on Abx.  His right great toe duskiness appeared worse, but did continue to have palpable pedal pulses.  That afternoon, his RR continued to be elevated over the vent settings.    By POD 11, he continued to be tachypneic.  His CXR on 6/16 revealed atelectasis +/- LLL infiltrate.  Vent settings were adjusted accordingly per CCM.  He continued to require pressor support.  Given  shock, popofol was discontinued.    By POD 12, his fentanyl and versed gtts were increased and pt sedated.  His renal function was worsening since being hypotensive.    By POD 13, his BUN/Cr were rising.  He He is not waking up likely from sedation and AKI/Azotemia. K is rising and Na is still low. Renal service is following. It was anticipated he may need CRRT. CVVHD was started per renal.  WBC started to trend downward with abx.  He did have a metabolic acidosis due to renal failure and bicarb was added.  That afternoon, the Nurse was alerted as pt was seen to be asystole on cardiac monitor. He was on neosynephrine drip during this time. He was on PST on the vent. He checked for pulse and did not feel any. By the time he was to give meds, pt's HR started coming up > from 30s to 50s to 80s.  Asystole/Sinus Bradycardia likely 2/2 Hyperkalemia.   Currently, sedated. 90/60, HR 104 SR, rate 20, 100% Fio2 on 40%. Sedated.  Good s1/s2. (-) m/r/g. Fair ae. Bibasilar crackles.  Cyanotic extremities.   On neosynpehrine drip still. Not higher dose. On full vent support.  PCCM ordered Ca chloride, 2 amps NaHCO3,  D50 1 amp,.  Contacted daughter. She said to discuss plans with ex wife. D/w ex wife and she is in agreement with HD cath. She knows the risk of bleeding is high but agrees to having it done. Ex wife ok with trache as well.  Will emergently place HD cath to start CVVH.  Check bmp now.  Check EKG Heparin drip has been turned off at 10:40am.   He had been sedated earlier and later was off sedation, but not really responsive.   Per Dr. Oneida Alar:   Pulmonary: VDRF agree with trach hopefully wean after  Neuro: had been confused prior to reintubation hopefully mental status will improve as sedation off and electrolytes improve  GI still high output from NG no real BM: will place postpyloric tube to try some trickle feeds. Will check amylase and lipase to rule out pancreatitis. Cont TPN  ID on antibiotics for presumed pneumonia as source of low grade sepsis  Cardiac: brief aystole today 15 sec witnessed returned without intervention or CPR. Currently on Amiodarone for afib. Heparin to protect cardiac stents until plavix can be restarted  Renal: high output renal failure. Starting CVVH today hopefully can recover from 2nd hit to kidneys. Multiple electrolyte disturbances need to be careful with rapid fluctuations in sodium to prevent cerebral injury. Adjust per dialysis and TPN. Appreciate renal assistance.  Hyperglycemia: increase insulin in TPN versus sliding scale?  That afternoon, he was tolerating CVVHD.  His Troponin was 4. 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 EKG on 6/17 was similar but q waves more prominent in septal leads in todays EKG.  Pt is on heparin drip already. Will give ASA. Plt cnt also dropping  Plan : cont present manangement. Cont CVVH. Cont heparin drip. Give ASA. Trend troponin. Check cbc at 8 pm >> if plt < 50K, will consider HIT and order argatroban.   His platelet count was 45k and heparin was changed to argatroban.    On ,  CCM had extensive discussion with ex wife Hassan Rowan) and daughter Hinton Dyer) over all poor prognosis.  They decided to make pt DNR - no acls, cardioversion, cardiac meds. They want to continue doing what we are doing.  Dr. Oneida Alar has been  updated of events. Per Dr. Oneida Alar on : Pt much worse this morning. Multisystem organ failure now with worsening hypotension despite pressors. ECHO this morning as this may be cardiac event Sepsis ? Dead gut with elevation of lactate Agree with critical care and family updated that pt may not survive. Will continue to see if we can stablize him during the day but agree with no CPR. Family understands he probably will not survive.  Dr. Einar Gip evaluated pt that day and reviewed the chart.  Per Dr. Einar Gip, he met with patient's daughter and family. Do not think cardiac issue and troponin lead is probably due to multiorgan failure. No change in EKG with underlying RBBB. Probably gut ischemia ?? Embolic complication is main culprit with ongoing septic shock.  CCM saw the pt later that afternoon. Pt has the following : Multiple organ failure Acute Hypoxemic hypercapneic resp fx 2/2 pulm edema/HCAP CAD/NSTEMI/PVD AKI/Lactic acidosis/metabolic acidosis Shock combination of septic and cardiogenic Concern for Ischemic bowel as well.  Concern for HITT.  Some better as epi drip has been weaned off. Still on levophed and neo drip. Will add vasopressin.  I have been bolusing with vecuronium 5 mg IV. Got 1 dose at 8am and am giving another dose now. If her remains to have "air hunger/gasping", may place pt on nimbex continuous infusion.  On argatroban drip.  Pt is DNR.  Pt expired later that afternoon.  He did have hypokalemia post-operatively, which was supplemented throughout his post operative course.   The remainder of the hospital course consisted of increasing mobilization and increasing intake of solids without difficulty.  CBC    Component  Value Date/Time   WBC 6.3  0423   WBC 6.9 03/18/2014 1605   RBC 3.27*  0423   RBC 3.94* 03/18/2014 1605   HGB 9.7*  0423   HCT 28.9*  0423   PLT 46*  0423   MCV 88.4  0423   MCH 29.7  0423   MCH 33.0 03/18/2014 1605   MCHC 33.6  0423   MCHC 34.7 03/18/2014 1605   RDW 16.4*  0423   RDW 14.3 03/18/2014 1605   LYMPHSABS 0.8 07/14/2015 0500   LYMPHSABS 1.9 03/18/2014 1605   MONOABS 0.5 07/14/2015 0500   EOSABS 0.1 07/14/2015 0500   EOSABS 0.3 03/18/2014 1605   BASOSABS 0.1 07/14/2015 0500   BASOSABS 0.0 03/18/2014 1605    BMET    Component Value Date/Time   NA 122*  1515   NA 143 03/18/2014 1605   K 5.7*  1515   CL 86*  1515   CO2 17*  1515   GLUCOSE 176*  1515   GLUCOSE 93 03/18/2014 1605   BUN 65*  1515   BUN 16 03/18/2014 1605   CREATININE 4.12*  1515   CALCIUM 6.7*  1515   GFRNONAA 13*  1515   GFRAA 16*  1515       Discharge Diagnosis:  Abdominal aortic aneurysm I71.4  Secondary Diagnosis: Patient Active Problem List   Diagnosis Date Noted  . Metabolic acidosis   . Acute respiratory failure ([place])   . HCAP (healthcare-associated pneumonia)   . Encounter for central line placement   . ARF (acute renal failure) ([place])   . Acute respiratory failure with hypoxemia ([place])   . Pressure ulcer 07/04/2015  . Aneurysm of abdominal aorta ([facility]) 06/26/2015  . AAA (abdominal aortic aneurysm) ([place]) 07/18/2015  . Hemorrhagic shock   . Epistaxis   . Coagulopathy (  [place])   . 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 Abnormal MRI of head 03/19/2015  . Circumscribed brain atrophy 03/19/2015  . Post PTCA 02/11/2015  . Angina pectoris ([place]) 02/09/2015  . CAD (coronary artery disease), native coronary artery 02/09/2015  . AAA (abdominal aortic aneurysm)  without rupture ([place]) 01/30/2015  . Abdominal aneurysm without mention of rupture 12/28/2012  . Essential tremor 08/18/2012  . Mild cognitive impairment with memory loss 08/18/2012  . 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 Hypertension   . Coronary artery disease   . AAA (abdominal aortic aneurysm) ([place])   . High cholesterol   . Depression   . Anxiety   . Essential tremor 08/18/2012  . Mild cognitive impairment with memory loss 08/18/2012  . CAD (coronary artery disease)   . Dementia   . Asthma   . Vitamin D deficiency   . H. pylori infection   . COPD (chronic obstructive pulmonary disease) ([place])   . Anemia 2016  . Heart murmur     slight  . Myocardial infarction ([place])     "I've had 3-4; last one was maybe 2006" (02/11/2015)  . Anginal pain ([place])   . Pneumonia and influenza   . Pneumonia     "2-3 times" (02/11/2015)  . Type II diabetes mellitus ([facility])     Metformin  . Pyloric spasm 1970s-1980s  . Arthritis     "knees" (02/11/2015)  . Stroke [facility])     "Dr. Einar Gip suspects I've had a mild stroke or 2" (02/11/2015)  . Migraine     hx none recent    Disposition: death   Leontine Locket, PA-C [facility] [phone] 07/22/2015  12:36 PM   - For VQI Registry use ---   Post-op:  Time to Extubation: '[]'  In OR, '[ ]'  < 12 hrs, '[ ]'  12-24 hrs, [ x] >=24 hrs Vasopressors Req. Post-op: Yes ICU Stay: entire hospital course Transfusion: Yes   If yes, 4 units PRBC's, 6 FFP, & 1 platelet phoresis pack given MI: No, '[ ]'  Troponin only, '[ ]'  EKG or Clinical New Arrhythmia: Yes  Complications: CHF: No Resp failure: Yes, '[ ]'  Pneumonia, [x ] Ventilator Chg in renal function: Yes, '[ ]'  Inc. Cr > 0.5, '[ ]'  Temp. Dialysis, '[ ]'  Permanent dialysis Leg ischemia: No, no Surgery needed, '[ ]'  Yes, Surgery needed, '[ ]'  Amputation Bowel ischemia: No, '[ ]'  Medical Rx, '[ ]'  Surgical Rx Wound complication: No, '[ ]'  Superficial  separation/infection, '[ ]'  Return to OR Return to OR: No  Return to OR for bleeding: No Stroke: No, '[ ]'  Minor, '[ ]'  Major

## 2015-07-26 DEATH — deceased

## 2016-03-18 ENCOUNTER — Ambulatory Visit: Payer: Medicare Other | Admitting: Adult Health

## 2016-11-07 IMAGING — CT CT CTA ABD/PEL W/CM AND/OR W/O CM
1 of 3 series · 1 of 32 positions shown, 3 images · IV contrast (omnipaque)
Comparison: 07/12/2013

CLINICAL DATA: Atherosclerotic abdominal aortic aneurysm

EXAM:
CT ANGIOGRAPHY ABDOMEN AND PELVIS
TECHNIQUE: Multidetector CT imaging of the abdomen and pelvis was performed
using the standard protocol during bolus administration of
intravenous contrast. Multiplanar reconstructed images including
MIPs were obtained and reviewed to evaluate the vascular anatomy.
CONTRAST:  100 cc Omnipaque 350

[Series 200: locator · axial · 0.49mm/px · z∈[-93,-93]mm · 1 of 1 slices shown, 3 images]
[im 1/1  soft-tissue]
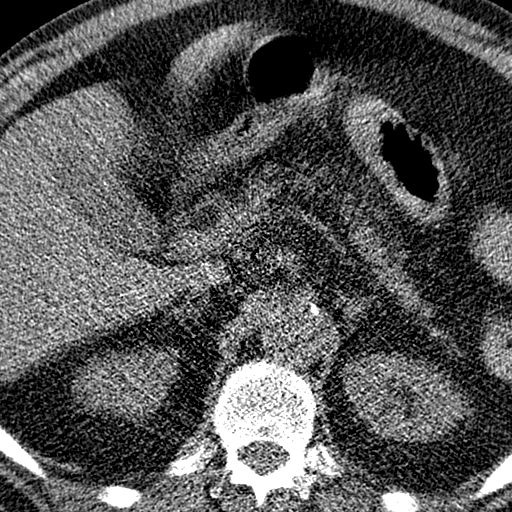
[im 1/1  lung]
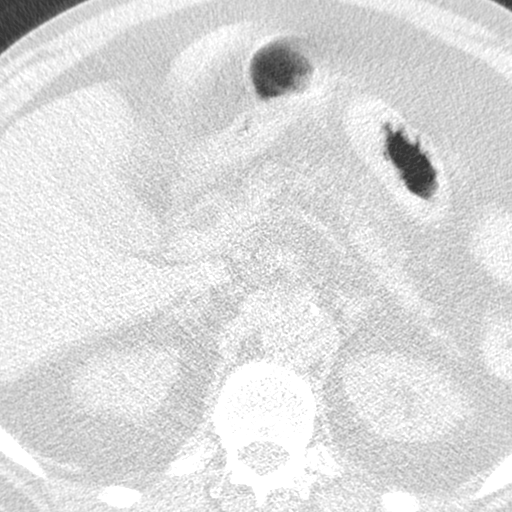
[im 1/1  bone]
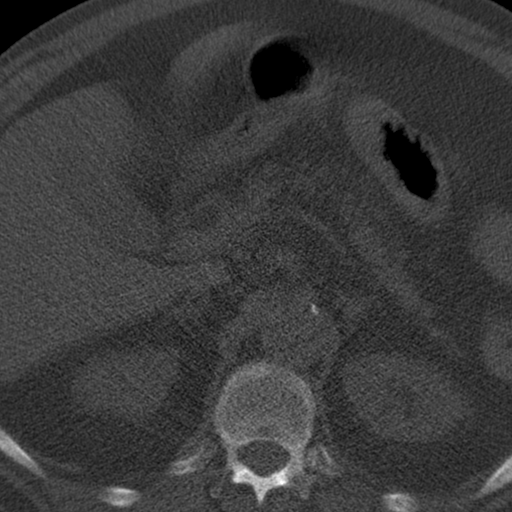

[1 of 32 positions shown; findings below may reference images not displayed]

FINDINGS: Arterial findings:

Aorta: Lower thoracic aorta is atherosclerotic and tortuous without
aneurysm. Infrarenal abdominal aorta demonstrates an atherosclerotic
fusiform aneurysm with mural thrombus. Maximal AP diameter 5.7 cm
and transverse 5.1 cm. Previous maximal size was 5.2 cm. Aneurysm
extends from below the renal arteries to the bifurcation. No
associated dissection, rupture, or retroperitoneal hemorrhage.

Celiac axis: Minor atherosclerotic change tortuosity. No significant
stenosis or occlusion.

Superior mesenteric: Minor atherosclerotic origin but widely patent.

Left renal:           Minor atherosclerotic origin but patent.

Right renal: Minor atherosclerotic origin but patent. No significant
renal artery stenosis.

Inferior mesenteric: Occluded origin off of the aneurysm anteriorly.
IMA is reconstituted via SMA collaterals.

Left iliac: Calcific atherosclerosis of the left iliac system. Iliac
vessels are tortuous. Left common, internal, and external iliac
arteries are patent. Mild areas of ectasia of the left iliac vessels
without occlusion or dissection.

Right iliac: Similar calcific atherosclerosis diffusely. Right iliac
system is tortuous and mildly ectatic but no occlusion or
dissection. No iliac aneurysm. Iliac vessels remain patent.

Venous findings:      Not performed

Review of the MIP images confirms the above findings.

Nonvascular findings: Minor dependent basilar atelectasis. Normal
heart size. No pericardial or pleural effusion.

Liver, gallbladder, biliary system, partially fatty replaced
pancreas, spleen, accessory splenule, adrenal glands, and kidneys
are within normal limits for age and demonstrate no acute process.

Negative for bowel obstruction, dilatation, ileus, or free air. No
abdominal or pelvic free fluid, fluid collection, hemorrhage,
abscess, or adenopathy.

Prostate gland is enlarged. Diffuse wall thickening of the bladder
as before suggesting a component of chronic bladder outlet
obstruction versus cystitis. Small fat containing left inguinal
hernia noted.

Degenerative changes of the spine. Diffuse facet arthropathy
evident.
IMPRESSION: 5.7 cm infrarenal fusiform atherosclerotic abdominal aortic
aneurysm, previously measuring 5.2 cm

Vascular surgery consultation recommended due to increased risk of
rupture for AAA >5.5 cm. This recommendation follows ACR consensus
guidelines: White Paper of the ACR Incidental Findings Committee II
on Vascular Findings. [HOSPITAL] 7963; [DATE].

Other stable chronic findings as above.

## 2017-03-20 IMAGING — DX DG CHEST 1V PORT
1 series · 1 of 1 positions shown · non-contrast
Comparison: 07/09/2015

CLINICAL DATA: Post intubation

EXAM:
PORTABLE CHEST 1 VIEW

[chest ap]
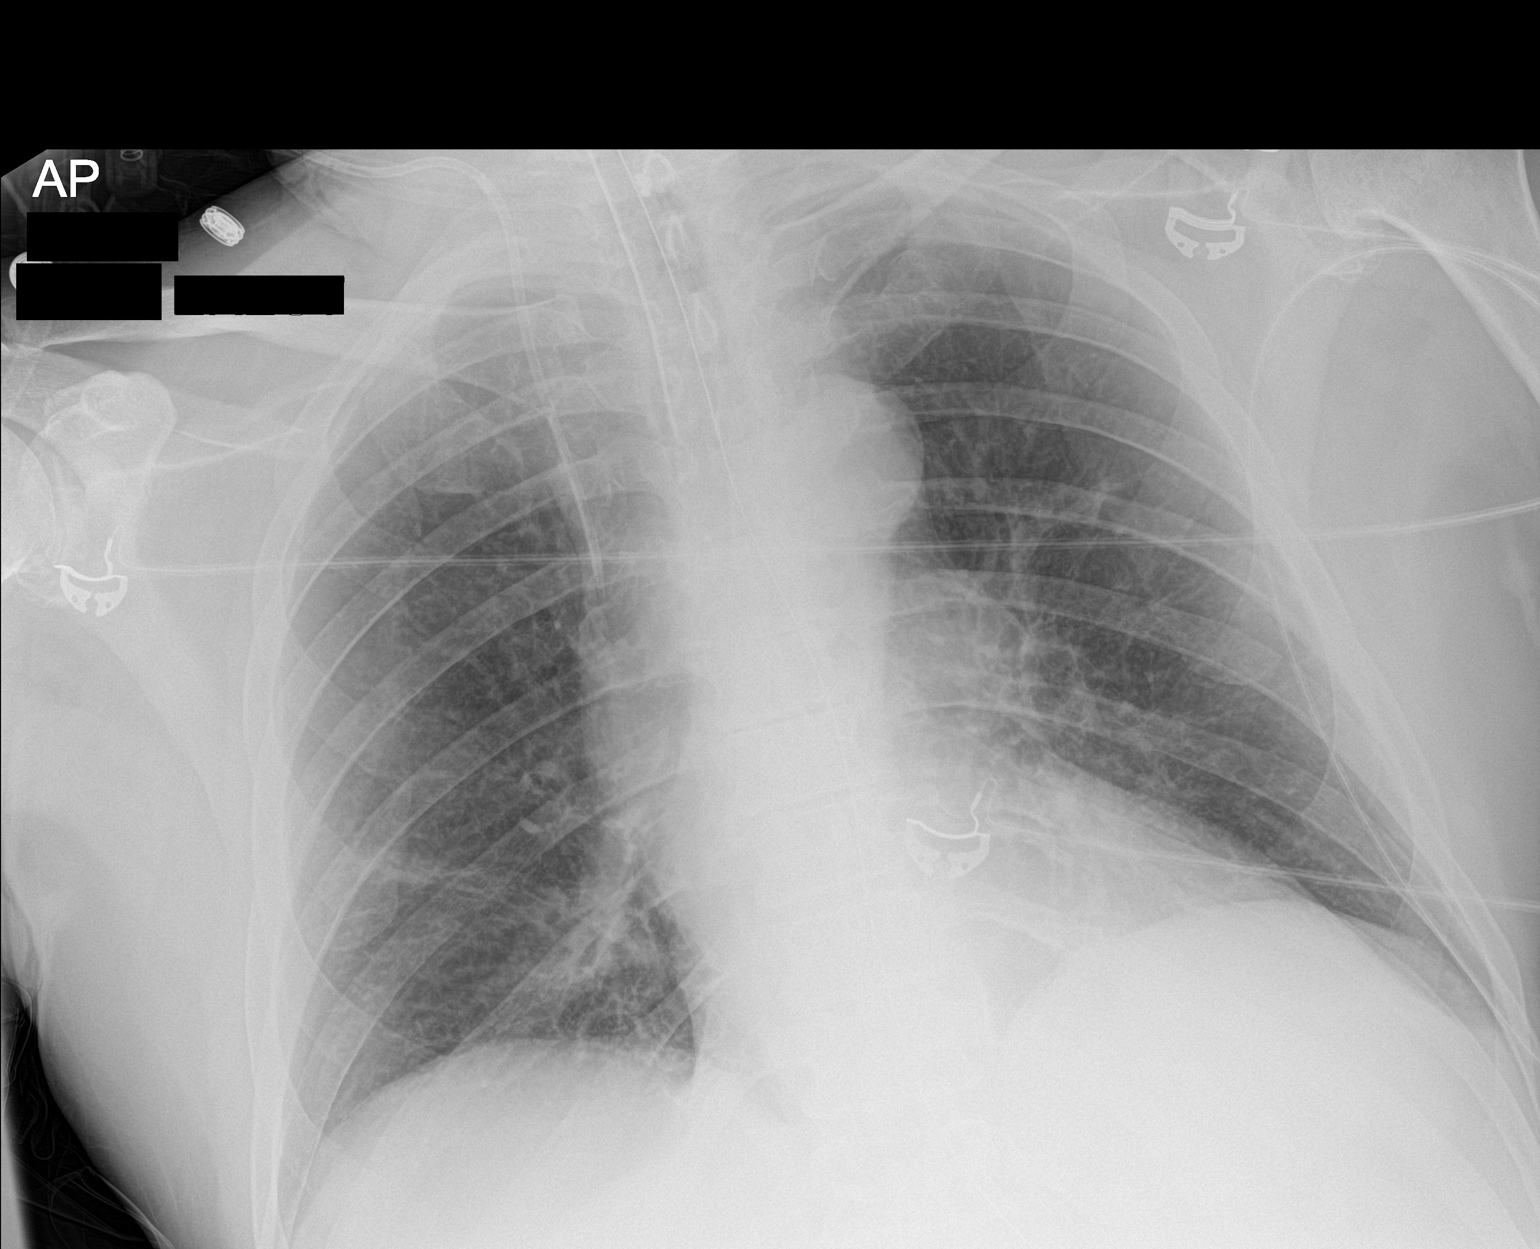

[1 of 1 positions shown; findings below may reference images not displayed]

FINDINGS: Interval placement of an endotracheal tube with tip measuring 5.2 cm
above the carina. Enteric tube tip is off the field of view but
below the left hemidiaphragm. Right central venous catheter with tip
over the mid SVC region. No pneumothorax. Shallow inspiration with
linear atelectasis in the lung bases and mid lung regions. No
blunting of costophrenic angles. No pneumothorax. Calcification of
the aorta. Normal heart size.
IMPRESSION: Appliances appear in satisfactory position. Shallow inspiration with
linear atelectasis in the lung bases.

## 2017-03-22 IMAGING — CR DG CHEST 1V PORT
1 series · 1 of 1 positions shown · non-contrast
Comparison: 07/10/2015.

CLINICAL DATA: Intubation.

EXAM:
PORTABLE CHEST 1 VIEW

[AP]
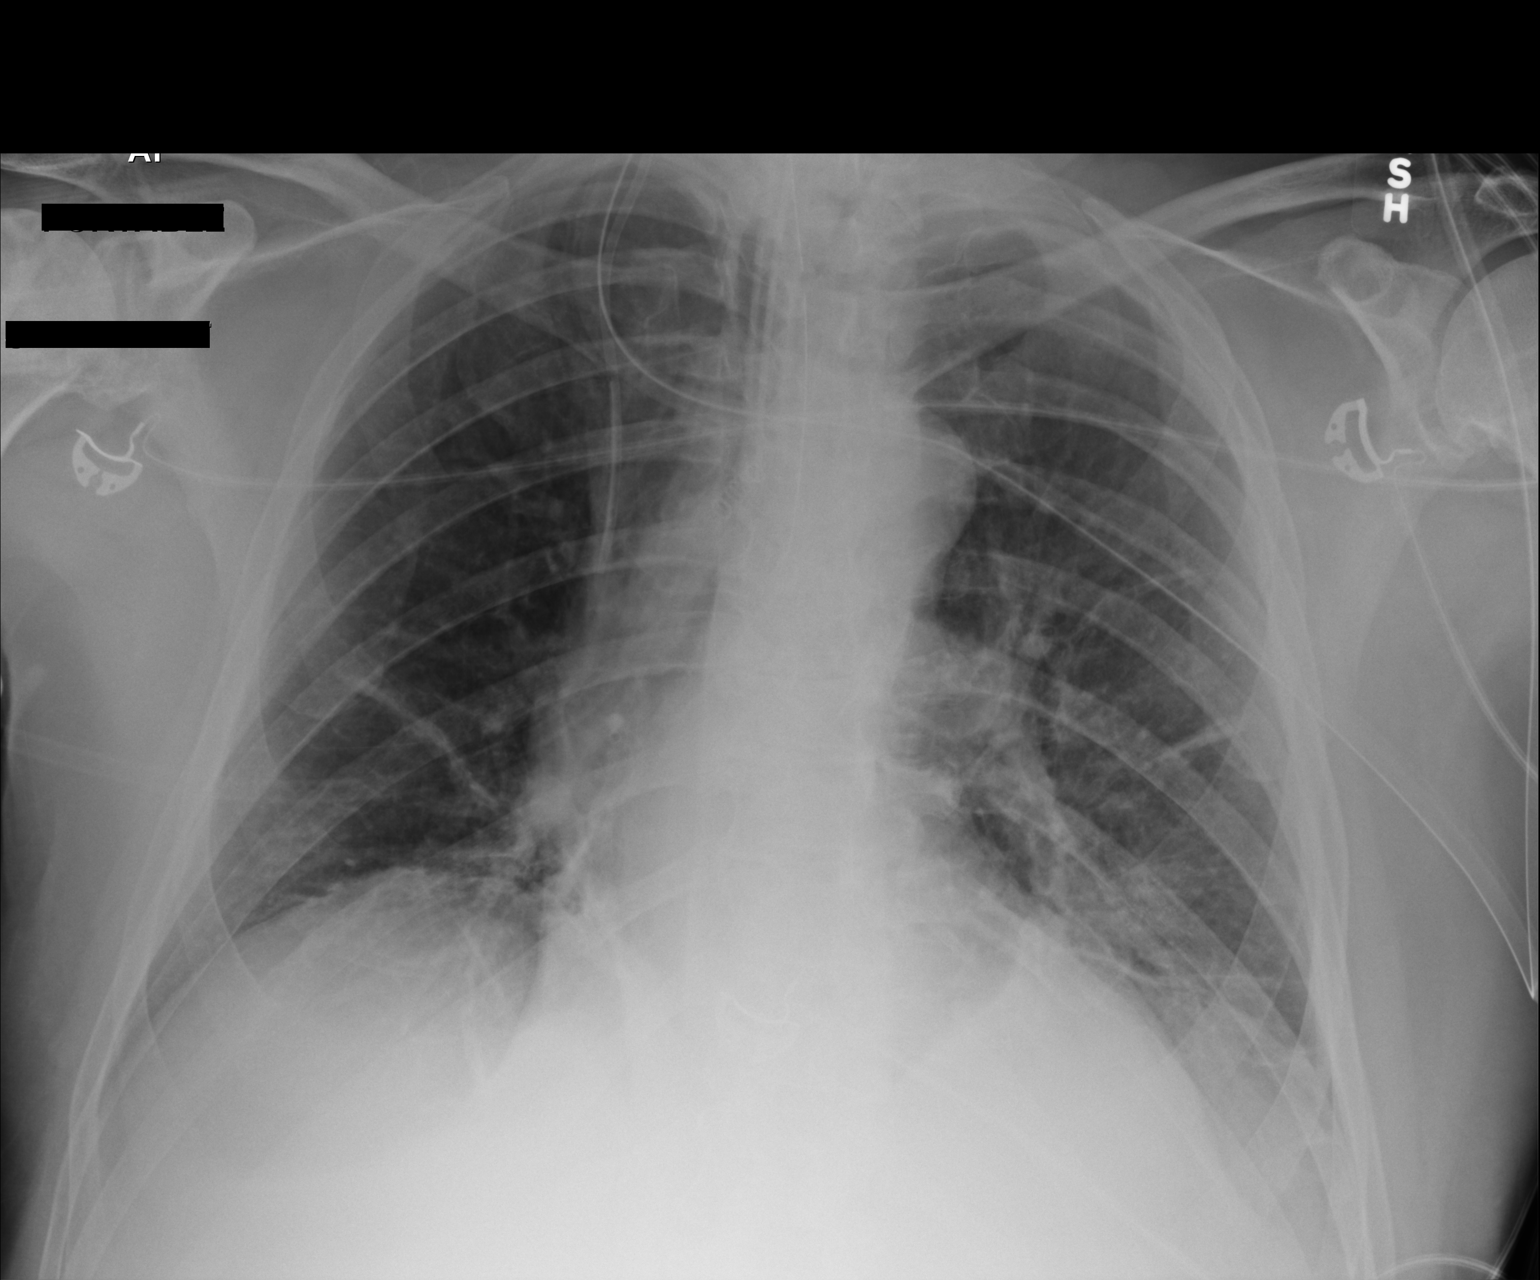

[1 of 1 positions shown; findings below may reference images not displayed]

FINDINGS: NG tube is been retracted, its tip is in the mid esophagus.
Repositioning suggested. Endotracheal tube and right IJ line stable
position. Bilateral mid lung and basilar atelectasis and/or
infiltrates. Slight interim clearing. No prominent pleural effusion
or pneumothorax.
IMPRESSION: 1. NG tube has been retracted, its tip is in the mid esophagus.
Repositioning suggested. Endotracheal tube and right IJ line in
stable position.
2. Persistent but improving bilateral mid lung field and bibasilar
atelectasis and/or infiltrates

Critical Value/emergent results were called by telephone at the time
of interpretation on 07/11/2015 at [DATE] to nurse Saanum who
verbally acknowledged these results.

## 2017-03-24 IMAGING — CR DG CHEST 1V PORT
1 series · 1 of 1 positions shown · non-contrast
Comparison: Chest radiograph 07/11/2015.

CLINICAL DATA: Patient status post ET tube placement.

EXAM:
PORTABLE CHEST 1 VIEW

[AP]
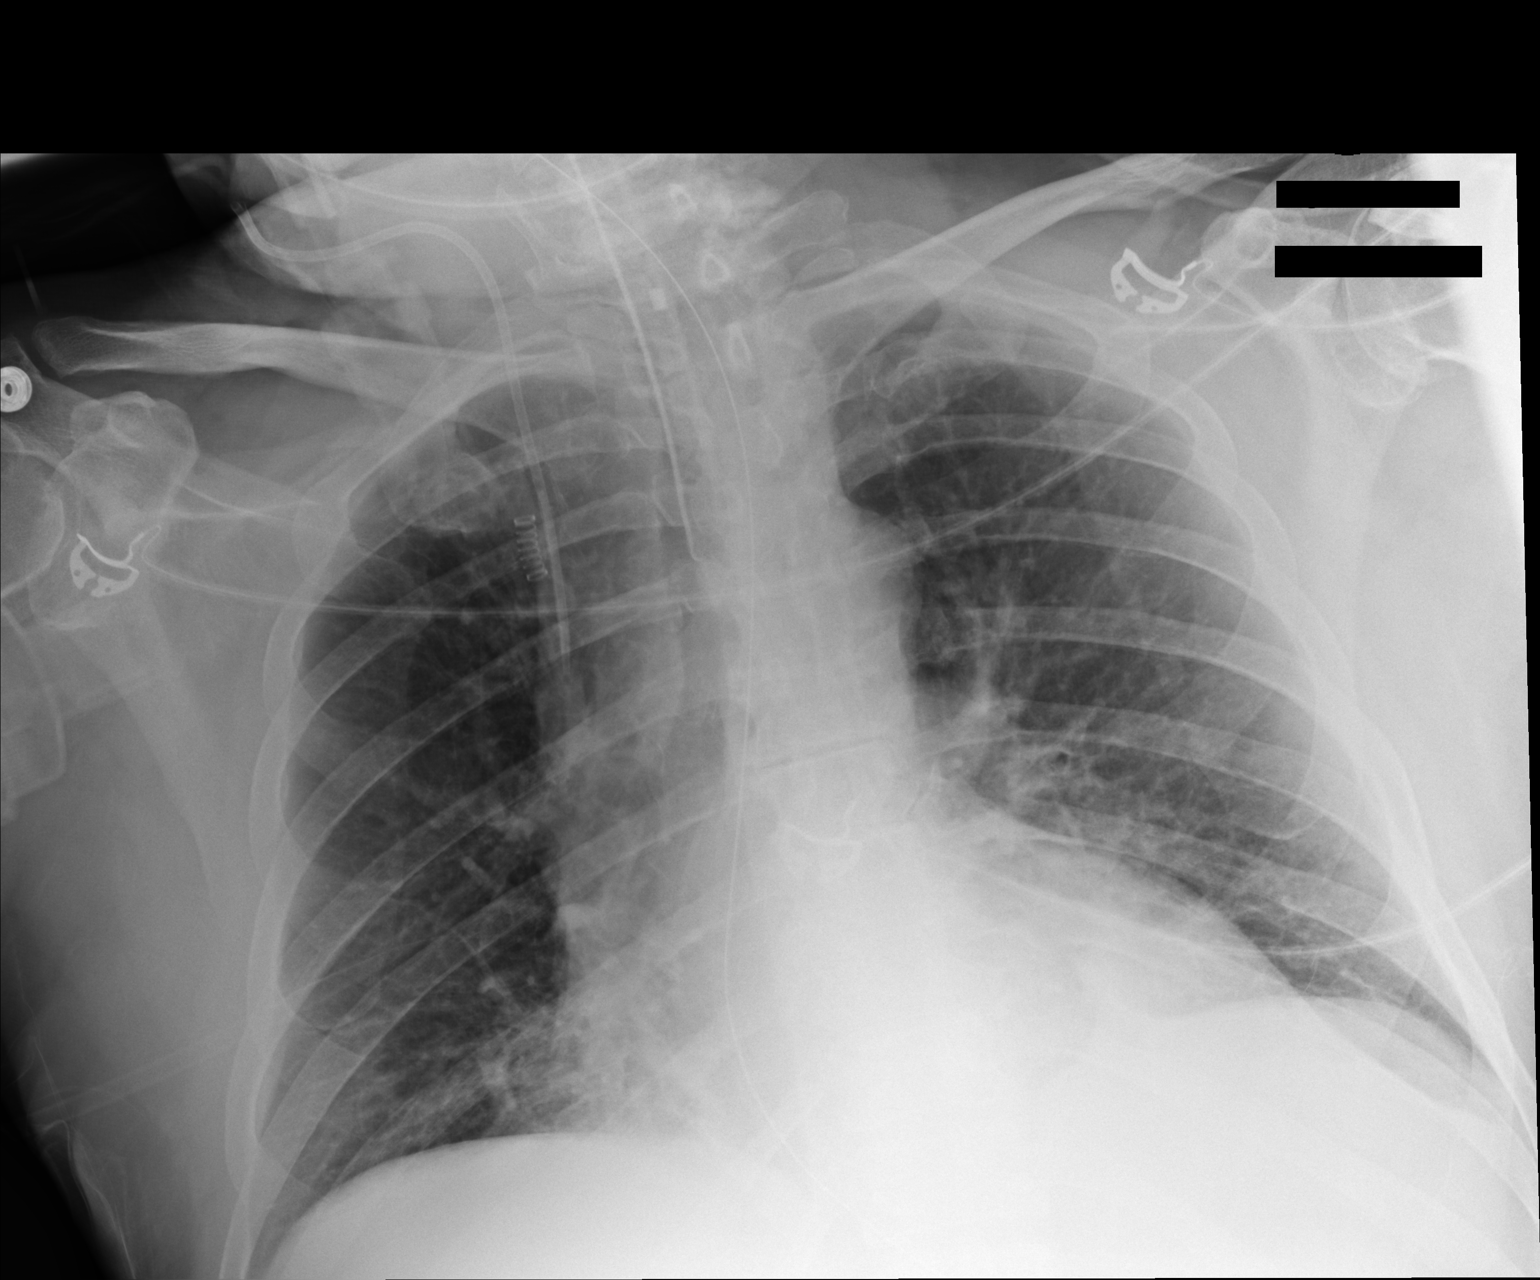

[1 of 1 positions shown; findings below may reference images not displayed]

FINDINGS: Right IJ central venous catheter tip projects over the superior vena
cava. Enteric tube courses inferior to the diaphragm. ET tube
terminates in the mid trachea. Stable enlarged cardiac and
mediastinal contours. Low lung volumes. Bibasilar airspace
opacities. No pleural effusion or pneumothorax.
IMPRESSION: Interval advancement enteric tube with tip coursing inferior to the
diaphragm.

Basilar opacities favored to represent atelectasis.
